# Patient Record
Sex: Male | Born: 1950 | Race: Black or African American | Hispanic: No | Marital: Married | Smoking: Never smoker
Health system: Southern US, Community
[De-identification: ages and names within clinical notes are randomized; demographics above are authoritative.]

## PROBLEM LIST (undated history)

## (undated) DIAGNOSIS — E559 Vitamin D deficiency, unspecified: Secondary | ICD-10-CM

## (undated) DIAGNOSIS — Q774 Achondroplasia: Secondary | ICD-10-CM

## (undated) DIAGNOSIS — I10 Essential (primary) hypertension: Secondary | ICD-10-CM

## (undated) DIAGNOSIS — M1990 Unspecified osteoarthritis, unspecified site: Secondary | ICD-10-CM

## (undated) DIAGNOSIS — E119 Type 2 diabetes mellitus without complications: Secondary | ICD-10-CM

## (undated) DIAGNOSIS — E785 Hyperlipidemia, unspecified: Secondary | ICD-10-CM

## (undated) DIAGNOSIS — H409 Unspecified glaucoma: Secondary | ICD-10-CM

## (undated) DIAGNOSIS — R51 Headache: Secondary | ICD-10-CM

## (undated) DIAGNOSIS — M109 Gout, unspecified: Secondary | ICD-10-CM

## (undated) DIAGNOSIS — R519 Headache, unspecified: Secondary | ICD-10-CM

## (undated) DIAGNOSIS — K4090 Unilateral inguinal hernia, without obstruction or gangrene, not specified as recurrent: Secondary | ICD-10-CM

## (undated) DIAGNOSIS — R001 Bradycardia, unspecified: Secondary | ICD-10-CM

## (undated) HISTORY — DX: Essential (primary) hypertension: I10

## (undated) HISTORY — DX: Achondroplasia: Q77.4

## (undated) HISTORY — DX: Headache, unspecified: R51.9

## (undated) HISTORY — DX: Unspecified osteoarthritis, unspecified site: M19.90

## (undated) HISTORY — DX: Headache: R51

## (undated) HISTORY — PX: CATARACT EXTRACTION W/ INTRAOCULAR LENS IMPLANT: SHX1309

## (undated) HISTORY — DX: Bradycardia, unspecified: R00.1

## (undated) HISTORY — DX: Type 2 diabetes mellitus without complications: E11.9

## (undated) HISTORY — DX: Hyperlipidemia, unspecified: E78.5

## (undated) HISTORY — PX: OTHER SURGICAL HISTORY: SHX169

## (undated) HISTORY — DX: Vitamin D deficiency, unspecified: E55.9

---
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
## 2011-03-25 ENCOUNTER — Ambulatory Visit: Payer: Self-pay | Admitting: General Surgery

## 2011-10-08 ENCOUNTER — Ambulatory Visit (INDEPENDENT_AMBULATORY_CARE_PROVIDER_SITE_OTHER): Payer: 59

## 2011-10-08 DIAGNOSIS — R079 Chest pain, unspecified: Secondary | ICD-10-CM

## 2011-10-08 DIAGNOSIS — L600 Ingrowing nail: Secondary | ICD-10-CM

## 2011-10-08 DIAGNOSIS — J45909 Unspecified asthma, uncomplicated: Secondary | ICD-10-CM

## 2011-10-08 DIAGNOSIS — E669 Obesity, unspecified: Secondary | ICD-10-CM

## 2011-10-08 DIAGNOSIS — M545 Low back pain, unspecified: Secondary | ICD-10-CM

## 2011-11-04 LAB — HM COLONOSCOPY: HM Colonoscopy: NORMAL

## 2011-11-05 ENCOUNTER — Ambulatory Visit (INDEPENDENT_AMBULATORY_CARE_PROVIDER_SITE_OTHER): Payer: 59

## 2011-11-05 DIAGNOSIS — K5900 Constipation, unspecified: Secondary | ICD-10-CM

## 2011-11-05 DIAGNOSIS — R252 Cramp and spasm: Secondary | ICD-10-CM

## 2011-11-05 DIAGNOSIS — M4800 Spinal stenosis, site unspecified: Secondary | ICD-10-CM

## 2011-12-31 ENCOUNTER — Ambulatory Visit (INDEPENDENT_AMBULATORY_CARE_PROVIDER_SITE_OTHER): Payer: 59 | Admitting: Family Medicine

## 2011-12-31 DIAGNOSIS — IMO0002 Reserved for concepts with insufficient information to code with codable children: Secondary | ICD-10-CM

## 2011-12-31 DIAGNOSIS — J019 Acute sinusitis, unspecified: Secondary | ICD-10-CM

## 2011-12-31 DIAGNOSIS — M5116 Intervertebral disc disorders with radiculopathy, lumbar region: Secondary | ICD-10-CM

## 2011-12-31 MED ORDER — PREDNISONE 20 MG PO TABS: 20.0000 mg | ORAL_TABLET | Freq: Every day | ORAL | Status: AC

## 2011-12-31 MED ORDER — DICLOFENAC SODIUM 75 MG PO TBEC: 75.0000 mg | DELAYED_RELEASE_TABLET | Freq: Two times a day (BID) | ORAL | Status: DC

## 2011-12-31 MED ORDER — HYDROCODONE-ACETAMINOPHEN 5-500 MG PO TABS: 1.0000 | ORAL_TABLET | Freq: Four times a day (QID) | ORAL | Status: AC | PRN

## 2011-12-31 MED ORDER — METAXALONE 800 MG PO TABS: ORAL_TABLET | ORAL | Status: DC

## 2011-12-31 MED ORDER — AMOXICILLIN 500 MG PO CAPS: 500.0000 mg | ORAL_CAPSULE | Freq: Two times a day (BID) | ORAL | Status: AC

## 2011-12-31 NOTE — Progress Notes (Signed)
S: Patient is in here with 2 weeks of Right hip and theigh pain.  It started with kicking stuff into a dumster at work.  Then this week he was riding some equipment which bounced hard and flared again.  See Jan note  Also has sinusitis, pnd, no drainage.  O: Frontal tenderness.  Tm's n;/  Throat clear.  Neck supple .  Chest CTA Right SI tenderness.   Decreased flexion of spine.  SLR+++at 10% on right, negative on left.  DTR's ok  A:  Low back to lateral posterior theigh pain, radiating, prob. Disc      Sinusitis  P:  Steroids      Pain meds      Muscle relaxants      Rx sinuses.

## 2011-12-31 NOTE — Patient Instructions (Signed)

## 2012-01-14 ENCOUNTER — Telehealth: Payer: Self-pay

## 2012-01-14 NOTE — Telephone Encounter (Signed)
Can we do anything differently to help?  Pls advise

## 2012-01-14 NOTE — Telephone Encounter (Signed)
.  [facility] Patient called to state that he was not experiencing any relief from the hip pain. Patient was seen on 12/31/11 about the hip pain and would like Dr. Alwyn Ren to call something else in to his pharmacy to help relieve the pain. Patient also stated that he had an infection in his throat and would like a medication called in for that as well.  Please call the patient at [phone] to discuss his treatment options.  Patient stated that Dr. Alwyn Ren told him to call if he was not experiencing relief from symptoms.

## 2012-01-15 ENCOUNTER — Telehealth: Payer: Self-pay

## 2012-01-15 NOTE — Telephone Encounter (Signed)
Pt is needing to talk with someone about that he has the same symptons and hip pain and would like something else called in to [address]

## 2012-01-15 NOTE — Telephone Encounter (Signed)
Previous message from yesterday routed to Dr. Alwyn Ren.  Please advise.

## 2012-01-16 ENCOUNTER — Ambulatory Visit (INDEPENDENT_AMBULATORY_CARE_PROVIDER_SITE_OTHER): Payer: 59 | Admitting: Physician Assistant

## 2012-01-16 VITALS — BP 144/82 | HR 49 | Temp 97.7°F | Resp 12 | Ht 68.0 in | Wt 239.0 lb

## 2012-01-16 DIAGNOSIS — M549 Dorsalgia, unspecified: Secondary | ICD-10-CM

## 2012-01-16 DIAGNOSIS — M62838 Other muscle spasm: Secondary | ICD-10-CM

## 2012-01-16 DIAGNOSIS — S39012A Strain of muscle, fascia and tendon of lower back, initial encounter: Secondary | ICD-10-CM

## 2012-01-16 DIAGNOSIS — S335XXA Sprain of ligaments of lumbar spine, initial encounter: Secondary | ICD-10-CM

## 2012-01-16 MED ORDER — HYDROCODONE-ACETAMINOPHEN 5-325 MG PO TABS: 1.0000 | ORAL_TABLET | Freq: Four times a day (QID) | ORAL | Status: AC | PRN

## 2012-01-16 MED ORDER — MELOXICAM 15 MG PO TABS: 15.0000 mg | ORAL_TABLET | Freq: Every day | ORAL | Status: DC

## 2012-01-16 MED ORDER — CYCLOBENZAPRINE HCL 5 MG PO TABS: 5.0000 mg | ORAL_TABLET | Freq: Three times a day (TID) | ORAL | Status: AC | PRN

## 2012-01-16 NOTE — Telephone Encounter (Signed)
I saw pt this evening and took care of his concerns.

## 2012-01-16 NOTE — Telephone Encounter (Signed)
Pt came into 102 to ask whether he needed to be seen, and was given message from Dr Alwyn Ren. Pt decided to stay and be seen.

## 2012-01-16 NOTE — Progress Notes (Signed)
  Subjective:    Patient ID: Troy Obrien, male    DOB: January 08, 1951, 61 y.o.   MRN: [id number]  HPI Pt presents for lumbar pain - he has had for about 2 -3 wks started about the time of his hip pain - has noticed increased pain at work - the pain is in his R back, the muscle beside her vertebrae - some pain radiation to the L side.  No pain radiation into his legs.  Skelaxin and voltaren are not helping his symptoms - the vicodin helped the most - he would like more so he can continue to work over the next month.  Hip pain has improved.   Review of Systems  Musculoskeletal: Positive for back pain. Negative for myalgias and gait problem.  Neurological:       No paresthesia.  No weakness.       Objective:   Physical Exam  Constitutional: He is oriented to person, place, and time. He appears well-developed and well-nourished.  HENT:  Head: Normocephalic and atraumatic.  Right Ear: External ear normal.  Left Ear: External ear normal.  Pulmonary/Chest: Effort normal.  Musculoskeletal:       Lumbar back: He exhibits tenderness (paraspinal muscle spasm and tenderness on the R side), pain (paraspinal muscles - also with bending to the R, increased pain with extension vs flexion of ROM) and spasm (R side). He exhibits normal range of motion and no bony tenderness.       Back:  Neurological: He is alert and oriented to person, place, and time. He has normal reflexes.  Skin: Skin is warm and dry.  Psychiatric: He has a normal mood and affect. His behavior is normal. Judgment and thought content normal.          Assessment & Plan:   1. Back pain  cyclobenzaprine (FLEXERIL) 5 MG tablet, meloxicam (MOBIC) 15 MG tablet, HYDROcodone-acetaminophen (NORCO) 5-325 MG per tablet  2. Lumbar strain  cyclobenzaprine (FLEXERIL) 5 MG tablet, meloxicam (MOBIC) 15 MG tablet, HYDROcodone-acetaminophen (NORCO) 5-325 MG per tablet  3. 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  He should use heat on his muscles.  If he needs more pain meds because his problem is not improving he will need a referral to ortho.  We will change his NSAID and muscle relaxer because they seem to not be helping.

## 2012-01-16 NOTE — Telephone Encounter (Signed)
This should have improved with the medications we gave him. I think he needs to come back in for a recheck. Otherwise we can refer him to orthopedics if he prefers not to come back in here.

## 2012-02-02 ENCOUNTER — Encounter: Payer: Self-pay | Admitting: Family Medicine

## 2012-02-02 ENCOUNTER — Ambulatory Visit (INDEPENDENT_AMBULATORY_CARE_PROVIDER_SITE_OTHER): Payer: Self-pay | Admitting: Family Medicine

## 2012-02-02 VITALS — BP 120/70 | HR 72 | Temp 97.7°F | Resp 12 | Ht 67.5 in | Wt 232.0 lb

## 2012-02-02 DIAGNOSIS — I10 Essential (primary) hypertension: Secondary | ICD-10-CM

## 2012-02-02 DIAGNOSIS — IMO0001 Reserved for inherently not codable concepts without codable children: Secondary | ICD-10-CM

## 2012-02-02 DIAGNOSIS — R5383 Other fatigue: Secondary | ICD-10-CM

## 2012-02-02 DIAGNOSIS — H409 Unspecified glaucoma: Secondary | ICD-10-CM | POA: Insufficient documentation

## 2012-02-02 DIAGNOSIS — R5381 Other malaise: Secondary | ICD-10-CM

## 2012-02-02 DIAGNOSIS — M791 Myalgia, unspecified site: Secondary | ICD-10-CM

## 2012-02-02 HISTORY — DX: Essential (primary) hypertension: I10

## 2012-02-02 MED ORDER — LOSARTAN POTASSIUM-HCTZ 100-25 MG PO TABS: 1.0000 | ORAL_TABLET | Freq: Every day | ORAL | Status: DC

## 2012-02-02 NOTE — Progress Notes (Signed)
  Subjective:    Patient ID: Troy Obrien, male    DOB: 31-Aug-1951, 61 y.o.   MRN: [id number]  HPI  New patient to establish care. Past medical history reviewed. History of glaucoma followed by ophthalmologist. Hypertension treated with losartan HCTZ. Denies prior surgery. Compliant with blood pressure medication. No recent headaches or dizziness.  Patient has issue of myalgias and muscle stiffness mostly involving the trunk region. He has symmetric involvement with lower back, shoulders, arms. He has tried multiple muscle relaxers and nonsteroidals without improvement. He denies any fever or chills. Has pervasive fatigue. Appetite and weight are stable. Denies any arthralgias. He is not aware of any prior screening labs.  Past Medical History  Diagnosis Date  . Headache    History reviewed. No pertinent past surgical history.  reports that he has never smoked. He does not have any smokeless tobacco history on file. His alcohol and drug histories not on file. family history is not on file. No Known Allergies    Review of Systems  Constitutional: Positive for fatigue. Negative for fever, chills, appetite change and unexpected weight change.  Eyes: Negative for visual disturbance.  Respiratory: Negative for cough and shortness of breath.   Cardiovascular: Negative for chest pain.  Gastrointestinal: Negative for abdominal pain.  Musculoskeletal: Positive for back pain. Negative for myalgias and arthralgias.  Skin: Negative for rash.  Neurological: Negative for headaches.       Objective:   Physical Exam  Constitutional: He appears well-developed and well-nourished. No distress.  Neck: Neck supple.  Cardiovascular: Normal rate and regular rhythm.   Pulmonary/Chest: Effort normal and breath sounds normal. No respiratory distress. He has no wheezes. He has no rales.  Musculoskeletal: He exhibits no edema.  Lymphadenopathy:    He has no cervical adenopathy.            Assessment & Plan:  Polymyalgias. ?PMR.  No arthralgias.  Patient does not take any concerning medication such as statin. Check labs including sed rate, CBC, basic metabolic panel, and TSH. If sed rate elevated consider trial of low-dose prednisone.  Hypertension-stable. Reported glaucoma followed by opthalmology.

## 2012-02-02 NOTE — Patient Instructions (Signed)
We will call you regarding lab work. Consider scheduling complete physical soon.

## 2012-02-03 ENCOUNTER — Telehealth: Payer: Self-pay | Admitting: Family Medicine

## 2012-02-03 LAB — CBC WITH DIFFERENTIAL/PLATELET
Eosinophils Relative: 5 % (ref 0.0–5.0)
HCT: 40 % (ref 39.0–52.0)
Hemoglobin: 13.3 g/dL (ref 13.0–17.0)
Lymphs Abs: 1.8 10*3/uL (ref 0.7–4.0)
Monocytes Relative: 11.2 % (ref 3.0–12.0)
Platelets: 276 10*3/uL (ref 150.0–400.0)
WBC: 6.2 10*3/uL (ref 4.5–10.5)

## 2012-02-03 LAB — TSH: TSH: 0.38 u[IU]/mL (ref 0.35–5.50)

## 2012-02-03 LAB — BASIC METABOLIC PANEL
BUN: 29 mg/dL — ABNORMAL HIGH (ref 6–23)
Chloride: 102 mEq/L (ref 96–112)
Potassium: 4.5 mEq/L (ref 3.5–5.1)
Sodium: 138 mEq/L (ref 135–145)

## 2012-02-03 NOTE — Telephone Encounter (Signed)
Pt called and was told by Dr Caryl Never to call back in today, to see if there was anything pt needs to do re: labs or if a med was going to be called in per discussion during ov. Pt is aware that pcp is out of office.

## 2012-02-06 MED ORDER — PREDNISONE 10 MG PO TABS: 10.0000 mg | ORAL_TABLET | Freq: Every day | ORAL | Status: DC

## 2012-02-06 NOTE — Telephone Encounter (Signed)
[facility] call [facility]

## 2012-02-06 NOTE — Telephone Encounter (Signed)
Labs reviewed and significant for elevated creatinine ?chronic and mildly elevated sed rate of uncertain significance Recommend:  Trial of low dose prednisone 10 mg 2 daily for 3 days then decrease to one daily-until follow up and office follow up within 2 weeks to reassess.

## 2012-02-06 NOTE — Telephone Encounter (Signed)
Prednisone sent to pt pharmacy, pt given instructions.  He said he had taken prednisone once before and "it did not do anything".  Encouraged pt to take the med as instructed and follow-up in 2 weeks.

## 2012-03-02 ENCOUNTER — Ambulatory Visit (INDEPENDENT_AMBULATORY_CARE_PROVIDER_SITE_OTHER): Payer: 59 | Admitting: Family Medicine

## 2012-03-02 VITALS — BP 148/84 | HR 56 | Temp 97.5°F | Resp 16 | Ht 68.0 in

## 2012-03-02 DIAGNOSIS — S39012A Strain of muscle, fascia and tendon of lower back, initial encounter: Secondary | ICD-10-CM

## 2012-03-02 DIAGNOSIS — M62838 Other muscle spasm: Secondary | ICD-10-CM

## 2012-03-02 DIAGNOSIS — K4090 Unilateral inguinal hernia, without obstruction or gangrene, not specified as recurrent: Secondary | ICD-10-CM

## 2012-03-02 DIAGNOSIS — K4190 Unilateral femoral hernia, without obstruction or gangrene, not specified as recurrent: Secondary | ICD-10-CM

## 2012-03-02 MED ORDER — MELOXICAM 15 MG PO TABS: 15.0000 mg | ORAL_TABLET | Freq: Every day | ORAL | Status: DC

## 2012-03-02 NOTE — Patient Instructions (Signed)
Surgical referral to evaluate (R) direct inguinal hernia  Work restrictions until surgical evaluation  May take Mobic 15 mg as needed for discomfort  We will contact you with appointment for a complete physical examination at our appointment center

## 2012-03-02 NOTE — Progress Notes (Signed)
  Subjective:    Patient ID: Troy Obrien, male    DOB: 10/16/1951, 61 y.o.   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  Fire estinguisher's weigh between 25-60 pounds.  States had EGD/colonoscopy in the [place]; hernia noted (R) groin however told surgery was not neccessary  if patient was not symptomatic. Patient is requesting work restrictions.  Patient states no change in size of hernia, completely reduces and currently may be more tender.  Review of Systems     Objective:   Physical Exam  Constitutional: He appears well-developed.  Neck: Neck supple.  Cardiovascular: Normal rate, regular rhythm and normal heart sounds.   Pulmonary/Chest: Effort normal and breath sounds normal.  Abdominal: Soft. Bowel sounds are normal.  Genitourinary:       (R) direct inguinal hernia.  Non tender and fully reducible  Musculoskeletal:       Lumbar back: He exhibits tenderness (para lumbar muscles). He exhibits normal range of motion and no bony tenderness.  Neurological: He is alert. He has normal strength. No sensory deficit. He exhibits normal muscle tone.  Reflex Scores:      Patellar reflexes are 2+ on the right side and 2+ on the left side.      Neg SLR (B)          Assessment & Plan:   1. Lumbar strain  meloxicam (MOBIC) 15 MG tablet  2. Direct inguinal hernia     Patient instuctions:  Surgical referral to evaluate (R) direct inguinal hernia  Work restrictions until surgical evaluation  May take Mobic 15 mg as needed for discomfort  We will contact you with appointment for a complete physical examination at our appointment center

## 2012-03-12 ENCOUNTER — Encounter (INDEPENDENT_AMBULATORY_CARE_PROVIDER_SITE_OTHER): Payer: Self-pay | Admitting: General Surgery

## 2012-03-30 ENCOUNTER — Ambulatory Visit (INDEPENDENT_AMBULATORY_CARE_PROVIDER_SITE_OTHER): Payer: 59 | Admitting: General Surgery

## 2012-03-30 ENCOUNTER — Encounter (INDEPENDENT_AMBULATORY_CARE_PROVIDER_SITE_OTHER): Payer: Self-pay | Admitting: General Surgery

## 2012-03-30 VITALS — BP 120/84 | HR 60 | Temp 97.7°F | Resp 16 | Ht 67.5 in | Wt 238.4 lb

## 2012-03-30 DIAGNOSIS — R1011 Right upper quadrant pain: Secondary | ICD-10-CM | POA: Insufficient documentation

## 2012-03-30 DIAGNOSIS — M549 Dorsalgia, unspecified: Secondary | ICD-10-CM

## 2012-03-30 DIAGNOSIS — K4090 Unilateral inguinal hernia, without obstruction or gangrene, not specified as recurrent: Secondary | ICD-10-CM

## 2012-03-30 HISTORY — DX: Unilateral inguinal hernia, without obstruction or gangrene, not specified as recurrent: K40.90

## 2012-03-30 HISTORY — DX: Dorsalgia, unspecified: M54.9

## 2012-03-30 HISTORY — DX: Right upper quadrant pain: R10.11

## 2012-03-30 NOTE — Progress Notes (Signed)
Subjective:     Patient ID: Troy Obrien, male   DOB: [birth year], 61 y.o.   MRN: [id number]  HPI We are asked to see the patient in consultation by Dr. Caryl Never to evaluate him for a right inguinal hernia. The patient is a 61 year old white male who states that he has had a hernia for at least 2 years. He denies any nausea or vomiting. 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 At times he will notice a gurgling in the right groin. Occasionally he does get some discomfort associated with it. He has been able to push it back in.The patient also complains of significant right upper quadrant and epigastric pain which occurs all the time as well as low back pain  Review of Systems  Constitutional: Negative.   HENT: Negative.   Eyes: Negative.   Respiratory: Negative.   Cardiovascular: Negative.   Gastrointestinal: Negative.   Genitourinary: Negative.   Musculoskeletal: Negative.   Skin: Negative.   Neurological: Negative.   Hematological: Negative.   Psychiatric/Behavioral: Negative.        Objective:   Physical Exam  Constitutional: He is oriented to person, place, and time. He appears well-developed and well-nourished.  HENT:  Head: Normocephalic and atraumatic.  Eyes: Conjunctivae and EOM are normal. Pupils are equal, round, and reactive to light.  Neck: Normal range of motion. Neck supple.  Cardiovascular: Normal rate, regular rhythm and normal heart sounds.   Pulmonary/Chest: Effort normal and breath sounds normal.  Abdominal: Soft. Bowel sounds are normal.       The patient does have tenderness to palpation in the epigastric and right upper quadrant area. There is no palpable mass.  Musculoskeletal:       The patient complains of significant back pain but states it does not radiate down either leg  Neurological: He is alert and oriented to person, place, and time.  Skin: Skin is warm and dry.  Psychiatric: He has a normal mood and  affect. His behavior is normal.       Assessment:     At this point the patient appears to have 3 problems. #1 is a symptomatic right inguinal hernia. #2 is epigastric and right upper quadrant pain. #3 is significant low back pain.    Plan:     As far as his right upper quadrant pain is concerned I think he'll have a abdominal ultrasound to evaluate this. As far as his back pain is concerned I think he'll have an MRI of his low back because of his pain and heavy lifting that he doesn't work. As far as his right inguinal hernia is concerned, because of the risk of incarceration and strangulation if he could benefit from having this fixed. I've discussed with him in detail the risks and benefits the operation a fixed hernia as well as some of the technical aspects including the use of mesh and the risk of injury to the vas deferens and testicular artery and he understands. He would like to think about it and then call back when he is ready to schedule.

## 2012-03-30 NOTE — Patient Instructions (Signed)
Plan for right inguinal hernia repair with mesh Will call with results of mri back and ultrasound of abdomen

## 2012-04-16 ENCOUNTER — Other Ambulatory Visit (INDEPENDENT_AMBULATORY_CARE_PROVIDER_SITE_OTHER): Payer: Self-pay | Admitting: General Surgery

## 2012-04-16 DIAGNOSIS — M545 Low back pain: Secondary | ICD-10-CM

## 2012-04-23 ENCOUNTER — Ambulatory Visit
Admission: RE | Admit: 2012-04-23 | Discharge: 2012-04-23 | Disposition: A | Discharge status: Home / Self Care | Payer: 59 | Source: Ambulatory Visit | Attending: General Surgery | Admitting: General Surgery

## 2012-04-23 ENCOUNTER — Other Ambulatory Visit: Payer: Self-pay

## 2012-04-23 DIAGNOSIS — R1011 Right upper quadrant pain: Secondary | ICD-10-CM

## 2012-05-14 ENCOUNTER — Telehealth (INDEPENDENT_AMBULATORY_CARE_PROVIDER_SITE_OTHER): Payer: Self-pay | Admitting: General Surgery

## 2012-05-14 NOTE — Telephone Encounter (Signed)
LMOM for patient to call me back at his earliest conveniences.

## 2012-05-15 ENCOUNTER — Telehealth (INDEPENDENT_AMBULATORY_CARE_PROVIDER_SITE_OTHER): Payer: Self-pay | Admitting: General Surgery

## 2012-05-15 NOTE — Telephone Encounter (Signed)
Spoke with patient and informed him that his [place] did not show anything that would be causing him to have the RUQ pain.  I also explained to him that his insurance denied the MR of the spine so he will need to make an appt with [facility].  I explained that Thayer Ohm, the new pt coordinator for them, would be contacting him soon to get that appt set up for him.  He said this was fine and that he will expect her call.

## 2012-06-11 ENCOUNTER — Ambulatory Visit (INDEPENDENT_AMBULATORY_CARE_PROVIDER_SITE_OTHER): Payer: 59 | Admitting: Family Medicine

## 2012-06-11 ENCOUNTER — Ambulatory Visit: Payer: 59

## 2012-06-11 VITALS — BP 116/75 | HR 63 | Temp 97.6°F | Resp 16 | Ht 68.0 in | Wt 238.0 lb

## 2012-06-11 DIAGNOSIS — R079 Chest pain, unspecified: Secondary | ICD-10-CM

## 2012-06-11 DIAGNOSIS — R109 Unspecified abdominal pain: Secondary | ICD-10-CM

## 2012-06-11 DIAGNOSIS — E785 Hyperlipidemia, unspecified: Secondary | ICD-10-CM

## 2012-06-11 DIAGNOSIS — Z131 Encounter for screening for diabetes mellitus: Secondary | ICD-10-CM

## 2012-06-11 LAB — POCT CBC
Granulocyte percent: 49.2 % (ref 37–80)
HCT, POC: 44.1 % (ref 43.5–53.7)
Hemoglobin: 13.7 g/dL — AB (ref 14.1–18.1)
Lymph, poc: 2.7 (ref 0.6–3.4)
MCH, POC: 26.4 pg — AB (ref 27–31.2)
MCHC: 31.1 g/dL — AB (ref 31.8–35.4)
MCV: 85.1 fL (ref 80–97)
MID (cbc): 0.8 (ref 0–0.9)
MPV: 9.2 fL (ref 0–99.8)
POC Granulocyte: 3.3 (ref 2–6.9)
POC LYMPH PERCENT: 39.7 %L (ref 10–50)
POC MID %: 11.1 %M (ref 0–12)
Platelet Count, POC: 308 10*3/uL (ref 142–424)
RBC: 5.18 M/uL (ref 4.69–6.13)
RDW, POC: 15.7 %
WBC: 6.8 10*3/uL (ref 4.6–10.2)

## 2012-06-11 LAB — TROPONIN I: Troponin I: <0.01 ng/mL (ref <0.06)

## 2012-06-11 LAB — POCT GLYCOSYLATED HEMOGLOBIN (HGB A1C): Hemoglobin A1C: 6.4

## 2012-06-11 MED ORDER — TRAMADOL HCL 50 MG PO TABS: 50.0000 mg | ORAL_TABLET | Freq: Three times a day (TID) | ORAL | Status: AC | PRN

## 2012-06-11 NOTE — Progress Notes (Signed)
[facility]:  Office Visit  Chief Complaint:  Chief Complaint  Patient presents with  . Chest Pain    pressure with heaviness since Thursday    HPI: Troy Obrien is a 61 y.o. male who complains of: 1. Would like cholesterol checked. Was rx lovastatin but did not take it. Was given statin meds ( had nausea). Stopped taking meds. This was 2 years ago.  Ate something at 2 pm.   2.  Heavy pressure on chest. Anterior midepigastric, left chest pain. Lasted 5 minutes. CP at rest. Resolved on its own.  Occurred 5 days ago. Has not had one since.  Thursday. + heartburn history. Denies nausea, diaphoresis, palpitations, weakness, numbness, tingling, abd pain, shoulder pain when this occurred. Denies smoking or heavy alcohol consumption. Patient drinks 2 beers daily.   Past Medical History  Diagnosis Date  . Headache   . Hypertension   . Hyperlipidemia    Past Surgical History  Procedure Date  . Circumcision 1972  . Tonsillectomy 1963   History   Social History  . Marital Status: Legally Separated    Spouse Name: N/A    Number of Children: N/A  . Years of Education: N/A   Social History Main Topics  . Smoking status: Never Smoker   . Smokeless tobacco: None  . Alcohol Use: None  . Drug Use: None  . Sexually Active: None   Other Topics Concern  . None   Social History Narrative  . None   No family history on file. Allergies  Allergen Reactions  . Laxative (Bisacodyl) Hives   Prior to Admission medications   Medication Sig Start Date End Date Taking? Authorizing Provider  meloxicam (MOBIC) 15 MG tablet Take 1 tablet (15 mg total) by mouth daily. 03/02/12 03/02/13  Dois Davenport, MD  metaxalone (SKELAXIN) 800 MG tablet Take 800 mg by mouth 3 (three) times daily as needed.  12/31/11   Historical Provider, MD  predniSONE (DELTASONE) 10 MG tablet Take 1 tablet (10 mg total) by mouth daily. Take 2 tabs daily for 3 days, then decease to 1 tab daily 02-10-12    Kristian Covey, MD     ROS: The patient denies fevers, chills, night sweats, unintentional weight loss, palpitations, wheezing, dyspnea on exertion, nausea, vomiting, abdominal pain, dysuria, hematuria, melena, numbness, weakness, or tingling.   All other systems have been reviewed and were otherwise negative with the exception of those mentioned in the HPI and as above.    PHYSICAL EXAM: Filed Vitals:   06/11/12 1907  BP: 116/75  Pulse: 63  Temp: 97.6 F (36.4 C)  Resp: 16   Filed Vitals:   06/11/12 1907  Height: 5\' 8"  (1.727 m)  Weight: 238 lb (107.956 kg)   Body mass index is 36.19 kg/(m^2).  General: Alert, no acute distress HEENT:  Normocephalic, atraumatic, oropharynx patent. EOMI, PERRLA, fundoscopic exam Cardiovascular:  Regular rate and rhythm, no rubs murmurs or gallops.  No Carotid bruits, radial pulse intact. No pedal edema.  Respiratory: Clear to auscultation bilaterally.  No wheezes, rales, or rhonchi.  No cyanosis, no use of accessory musculature GI: No organomegaly, abdomen is soft and non-tender, positive bowel sounds.  No masses. Skin: No rashes. Neurologic: Facial musculature symmetric. Psychiatric: Patient is appropriate throughout our interaction. Lymphatic: No cervical lymphadenopathy Musculoskeletal: Gait intact. Back- full ROM, tender on right side of paraspinal msk, no atrophy/hypertrophy, 5/5 strength, 2/2 DTR   LABS: Results for orders placed in visit  on 06/11/12  POCT CBC      Component Value Range   WBC 6.8  4.6 - 10.2 K/uL   Lymph, poc 2.7  0.6 - 3.4   POC LYMPH PERCENT 39.7  10 - 50 %L   MID (cbc) 0.8  0 - 0.9   POC MID % 11.1  0 - 12 %M   POC Granulocyte 3.3  2 - 6.9   Granulocyte percent 49.2  37 - 80 %G   RBC 5.18  4.69 - 6.13 M/uL   Hemoglobin 13.7 (*) 14.1 - 18.1 g/dL   HCT, POC 16.1  09.6 - 53.7 %   MCV 85.1  80 - 97 fL   MCH, POC 26.4 (*) 27 - 31.2 pg   MCHC 31.1 (*) 31.8 - 35.4 g/dL   RDW, POC 04.5     Platelet Count, POC  308  142 - 424 K/uL   MPV 9.2  0 - 99.8 fL  POCT GLYCOSYLATED HEMOGLOBIN (HGB A1C)      Component Value Range   Hemoglobin A1C 6.4       EKG/XRAY:\   Primary read interpreted by Dr. Conley Rolls at [facility].   ASSESSMENT/PLAN: Encounter Diagnoses  Name Primary?  . Chest pain Yes  . Screening for diabetes mellitus   . Side pain    1. Acute Chest Pain-patient has risk factors ie HTN, XOL ( noncompliant secondary to SEs) and recently dx pre-diabetes in office. His CP was at rest and was localized to the midepigastric,left anterio region. It resolved on it's own after 5 minutes. He has had reflux  sxs before. He has been active and has had no CP since that one episode on Thursday. CXR and EKG normal. I will get basic labs, recheck his cholesterol level, and  get a Troponin-I .  Recommend that he be seen by cardiology if sxs return.  Patient may benefit from a low dose metformin for pre-diabetes but at this time he would like to defer since he wants to try losing weight first.  GO TO ER PRN FOR CP/SOB or worsening SXS  2. Msk sprain/strain/ pain due to pulling up amplifier with poor posture-Rx NSAID and  Tramadol prn  3. Mild anemia-Patient would like to defer fecal occult testing today. Patient denies any hematuria, melena or BRBPR, dizziness. He is UTD on colonoscopy, last one done on 2012 was normal per patient . He has been advised that I recommend a hemoccult test today but since he is planning to get a annual exam with Dr. Katrinka Blazing in the next 2 weeks then would like to get it done then.     Troy Capri PHUONG, DO 06/11/2012 8:44 PM

## 2012-06-12 LAB — LIPID PANEL
Cholesterol: 241 mg/dL — ABNORMAL HIGH (ref 0–200)
HDL: 42 mg/dL (ref >39)
LDL Cholesterol: 138 mg/dL — ABNORMAL HIGH (ref 0–99)
Total CHOL/HDL Ratio: 5.7 Ratio
Triglycerides: 306 mg/dL — ABNORMAL HIGH (ref <150)
VLDL: 61 mg/dL — ABNORMAL HIGH (ref 0–40)

## 2012-06-12 LAB — COMPREHENSIVE METABOLIC PANEL
AST: 18 U/L (ref 0–37)
BUN: 19 mg/dL (ref 6–23)
Calcium: 9.7 mg/dL (ref 8.4–10.5)
Chloride: 103 mEq/L (ref 96–112)
Creat: 1.53 mg/dL — ABNORMAL HIGH (ref 0.50–1.35)
Total Bilirubin: 0.3 mg/dL (ref 0.3–1.2)

## 2012-06-12 LAB — COMPREHENSIVE METABOLIC PANEL WITH GFR
ALT: 17 U/L (ref 0–53)
Albumin: 4.4 g/dL (ref 3.5–5.2)
Alkaline Phosphatase: 41 U/L (ref 39–117)
CO2: 25 meq/L (ref 19–32)
Glucose, Bld: 91 mg/dL (ref 70–99)
Potassium: 4.2 meq/L (ref 3.5–5.3)
Sodium: 138 meq/L (ref 135–145)
Total Protein: 7.8 g/dL (ref 6.0–8.3)

## 2012-06-13 ENCOUNTER — Other Ambulatory Visit: Payer: Self-pay | Admitting: Family Medicine

## 2012-06-13 ENCOUNTER — Telehealth: Payer: Self-pay

## 2012-06-13 ENCOUNTER — Telehealth: Payer: Self-pay | Admitting: Family Medicine

## 2012-06-13 DIAGNOSIS — R109 Unspecified abdominal pain: Secondary | ICD-10-CM

## 2012-06-13 MED ORDER — EZETIMIBE 10 MG PO TABS: 10.0000 mg | ORAL_TABLET | Freq: Every day | ORAL | Status: DC

## 2012-06-13 MED ORDER — HYDROCODONE-ACETAMINOPHEN 5-325 MG PO TABS: 1.0000 | ORAL_TABLET | Freq: Three times a day (TID) | ORAL | Status: AC | PRN

## 2012-06-13 MED ORDER — HYDROCODONE-ACETAMINOPHEN 5-325 MG PO TABS: 1.0000 | ORAL_TABLET | Freq: Three times a day (TID) | ORAL | Status: DC | PRN

## 2012-06-13 NOTE — Telephone Encounter (Signed)
Spoke to patient about labs. Will rx Zetia. His msk pain has not improved. I told him I will leave a rx for 20 pills of hydrocodone up front for him to pick up. He has tried tramadil, dioclofenac, mobic anf msk relaxers without relief. Aadvise to make f/u appt for annual exam.

## 2012-07-03 ENCOUNTER — Encounter: Payer: Self-pay | Admitting: Family Medicine

## 2012-07-08 ENCOUNTER — Other Ambulatory Visit: Payer: Self-pay | Admitting: *Deleted

## 2012-07-08 ENCOUNTER — Ambulatory Visit (INDEPENDENT_AMBULATORY_CARE_PROVIDER_SITE_OTHER): Payer: 59 | Admitting: Family Medicine

## 2012-07-08 VITALS — BP 135/73 | HR 71 | Temp 98.1°F | Resp 16 | Ht 67.0 in | Wt 231.0 lb

## 2012-07-08 DIAGNOSIS — K469 Unspecified abdominal hernia without obstruction or gangrene: Secondary | ICD-10-CM

## 2012-07-08 DIAGNOSIS — M19049 Primary osteoarthritis, unspecified hand: Secondary | ICD-10-CM

## 2012-07-08 DIAGNOSIS — J329 Chronic sinusitis, unspecified: Secondary | ICD-10-CM

## 2012-07-08 DIAGNOSIS — R5383 Other fatigue: Secondary | ICD-10-CM

## 2012-07-08 DIAGNOSIS — G4730 Sleep apnea, unspecified: Secondary | ICD-10-CM

## 2012-07-08 DIAGNOSIS — R5381 Other malaise: Secondary | ICD-10-CM

## 2012-07-08 HISTORY — DX: Unspecified abdominal hernia without obstruction or gangrene: K46.9

## 2012-07-08 HISTORY — DX: Other fatigue: R53.83

## 2012-07-08 HISTORY — DX: Primary osteoarthritis, unspecified hand: M19.049

## 2012-07-08 HISTORY — DX: Sleep apnea, unspecified: G47.30

## 2012-07-08 LAB — POCT CBC
MCH, POC: 27.2 pg (ref 27–31.2)
MCV: 84.1 fL (ref 80–97)
MID (cbc): 0.7 (ref 0–0.9)
MPV: 9 fL (ref 0–99.8)
POC LYMPH PERCENT: 33.8 %L (ref 10–50)
POC MID %: 10.8 %M (ref 0–12)
Platelet Count, POC: 294 10*3/uL (ref 142–424)
RBC: 4.89 M/uL (ref 4.69–6.13)
RDW, POC: 15.1 %
WBC: 6.4 10*3/uL (ref 4.6–10.2)

## 2012-07-08 MED ORDER — FLUTICASONE PROPIONATE 50 MCG/ACT NA SUSP: 2.0000 | Freq: Every day | NASAL | Status: DC

## 2012-07-08 MED ORDER — CEFDINIR 300 MG PO CAPS: ORAL_CAPSULE | ORAL | Status: DC

## 2012-07-08 MED ORDER — DICLOFENAC SODIUM 75 MG PO TBEC: DELAYED_RELEASE_TABLET | ORAL | Status: DC

## 2012-07-08 NOTE — Progress Notes (Signed)
Subjective: he is here for a number of things. He has problems with his sinuses been very congested he says is worse she's ever had. He gets some fevers and chills. He has a lot of mucus urine some cough.  He is fatigued all the time. He's been told he is to go back in for a full sleep study. Never did so.Snores and has  apneac spells.  He has a ride or hernia which has gradually gotten bigger. I checked once about a year ago. I did not reexamine today. He is seeing a Careers adviser and needs to get it taken care of.  He has bad arthritis in his hands. Stretches left hand hurts all time. 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  Objective: TMs normal. Throat clear. Neck supple without nodes. Chest clear. Heart regular without murmurs. Abdomen soft nontender. Left hand is very tender on the index finger are M. CP joint.  Assessment: Obesity Arthritis of hands Sinusitis Sleep apnea syndrome Fatigue Inguinal hernia   Plan: Told him he needs to lose weight. Diclofenac her hands Omnicef for sinusitis Sleep study

## 2012-07-08 NOTE — Patient Instructions (Signed)
Take meds for arthritis and sinusitis  Sleep study will be ordered  Return if not doing betgter

## 2012-07-12 ENCOUNTER — Encounter: Payer: Self-pay | Admitting: Family Medicine

## 2012-08-12 ENCOUNTER — Telehealth: Payer: Self-pay

## 2012-08-12 NOTE — Telephone Encounter (Signed)
He has bad arthritis in his hands. Stretches left hand hurts all time. 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

## 2012-08-12 NOTE — Telephone Encounter (Signed)
Patient states Dr. Alwyn Ren rx'ed him something for arthritis in finger joints and it is not doing the trick. Would like to know if something else can be called in. [facility] [address] [phone]

## 2012-08-13 ENCOUNTER — Telehealth: Payer: Self-pay

## 2012-08-13 MED ORDER — MELOXICAM 15 MG PO TABS: 15.0000 mg | ORAL_TABLET | Freq: Every day | ORAL | Status: DC

## 2012-08-13 NOTE — Telephone Encounter (Signed)
Called patient he will have [facility].

## 2012-08-13 NOTE — Telephone Encounter (Signed)
Patient requested meloxicam (MOBIC) 15 MG tablet [[id number]] to be [facility] [address] but it was sent to [place]. Can we please send this to [address]?

## 2012-08-13 NOTE — Telephone Encounter (Signed)
Please advise patient that I've sent in meloxicam.  If it's not effective, he'll need to RTC for additional evaluation.

## 2012-08-13 NOTE — Telephone Encounter (Signed)
patient notified and voiced understanding. 

## 2012-09-06 ENCOUNTER — Ambulatory Visit (INDEPENDENT_AMBULATORY_CARE_PROVIDER_SITE_OTHER): Payer: 59 | Admitting: General Surgery

## 2012-09-06 ENCOUNTER — Encounter (INDEPENDENT_AMBULATORY_CARE_PROVIDER_SITE_OTHER): Payer: Self-pay | Admitting: General Surgery

## 2012-09-06 VITALS — BP 126/74 | HR 64 | Temp 97.8°F | Resp 20 | Ht 67.5 in | Wt 233.2 lb

## 2012-09-06 DIAGNOSIS — K4090 Unilateral inguinal hernia, without obstruction or gangrene, not specified as recurrent: Secondary | ICD-10-CM

## 2012-09-06 NOTE — Patient Instructions (Signed)
Plan for right inguinal hernia repair with mesh 

## 2012-09-10 NOTE — Progress Notes (Signed)
Subjective:     Patient ID: Troy Obrien, male   DOB: [birth year], 61 y.o.   MRN: [id number]  HPI The patient is a 61 year old black male who we saw back in May with a right inguinal hernia. At that time he got nervous about scheduling surgery and canceled it. Since that time he has continued to have some discomfort and bulging in the right groin. He denies any nausea or vomiting. He does a lot of heavy lifting at work and is now ready to schedule surgery.  Review of Systems  Constitutional: Negative.   HENT: Negative.   Eyes: Negative.   Respiratory: Negative.   Cardiovascular: Negative.   Gastrointestinal: Negative.   Genitourinary: Negative.   Musculoskeletal: Negative.   Skin: Negative.   Neurological: Negative.   Hematological: Negative.   Psychiatric/Behavioral: Negative.        Objective:   Physical Exam  Constitutional: He is oriented to person, place, and time. He appears well-developed and well-nourished.  HENT:  Head: Normocephalic and atraumatic.  Eyes: Conjunctivae normal and EOM are normal. Pupils are equal, round, and reactive to light.  Neck: Normal range of motion. Neck supple.  Cardiovascular: Normal rate, regular rhythm and normal heart sounds.   Pulmonary/Chest: Effort normal and breath sounds normal.  Abdominal: Soft. Bowel sounds are normal.  Genitourinary:       There is a moderate-sized bulge in the right groin that does reduce. There is no palpable bulge or impulse with straining in the left groin  Musculoskeletal: Normal range of motion.  Neurological: He is alert and oriented to person, place, and time.  Skin: Skin is warm and dry.  Psychiatric: He has a normal mood and affect. His behavior is normal.       Assessment:     The patient has a symptomatic moderate size right inguinal hernia. Because of the risk of incarceration and stimulation I think he would benefit from having this fixed. I've discussed with him in detail the risks and benefits of  the operation a fixed hernia as well as some of the technical aspects and he understands and wishes to proceed.    Plan:     Plan for right inguinal hernia repair with mesh.

## 2012-09-14 ENCOUNTER — Telehealth (INDEPENDENT_AMBULATORY_CARE_PROVIDER_SITE_OTHER): Payer: Self-pay

## 2012-09-14 NOTE — Telephone Encounter (Signed)
Pt called stating he has left a request with Dr Billey Chang assistant for pain medication pre op. Per Penni Bombard Dr Carolynne Edouard declined prescribing any pain medication until after surgery. Pt advised of this.

## 2012-09-15 ENCOUNTER — Ambulatory Visit (INDEPENDENT_AMBULATORY_CARE_PROVIDER_SITE_OTHER): Payer: 59 | Admitting: Family Medicine

## 2012-09-15 VITALS — BP 148/72 | HR 66 | Temp 97.7°F | Resp 16 | Ht 67.0 in | Wt 236.0 lb

## 2012-09-15 DIAGNOSIS — M12849 Other specific arthropathies, not elsewhere classified, unspecified hand: Secondary | ICD-10-CM

## 2012-09-15 DIAGNOSIS — R739 Hyperglycemia, unspecified: Secondary | ICD-10-CM

## 2012-09-15 DIAGNOSIS — R7309 Other abnormal glucose: Secondary | ICD-10-CM

## 2012-09-15 DIAGNOSIS — IMO0002 Reserved for concepts with insufficient information to code with codable children: Secondary | ICD-10-CM

## 2012-09-15 DIAGNOSIS — M25549 Pain in joints of unspecified hand: Secondary | ICD-10-CM

## 2012-09-15 DIAGNOSIS — M79673 Pain in unspecified foot: Secondary | ICD-10-CM

## 2012-09-15 DIAGNOSIS — L600 Ingrowing nail: Secondary | ICD-10-CM

## 2012-09-15 MED ORDER — CEPHALEXIN 500 MG PO CAPS: 500.0000 mg | ORAL_CAPSULE | Freq: Three times a day (TID) | ORAL | Status: DC

## 2012-09-15 MED ORDER — HYDROCODONE-ACETAMINOPHEN 5-500 MG PO TABS: 1.0000 | ORAL_TABLET | ORAL | Status: DC | PRN

## 2012-09-15 MED ORDER — MELOXICAM 15 MG PO TABS: 15.0000 mg | ORAL_TABLET | Freq: Every day | ORAL | Status: DC

## 2012-09-15 NOTE — Progress Notes (Signed)
Subjective: Patient is here with couple of complaints. He has pain in the left second MCP joint. The meloxicam gives variable released.  He is he has a ingrown left large toenail which been hurting him quite a lot. He's had it for a long time, probably a couple of years. It's getting worse and he is he is having a hard time wearing his steel toed boots.  Objective: Very tender MCP joint of his index finger left hand. It is slightly swollen.  All labs were reviewed  He has an ingrown left large toenail. It is very tender. There is paronychia little inflammation. His sugars were borderline the past.  Procedure note: Toes with 2% lidocaine. Excision was performed using a routine sterile technique of the medial one third of the nail. It was badly ingrown. There was pus which was cultured. The growth plate was cauterized using the hot needle.  Assessment: Ingrown toenail Arthritis and pain left MCP  Plan: Hydrocodone Continue Mobic Placed him on Keflex 500 3 times a day Return in 6 days for recheck, sooner if worse. Check blood sugar and uric acid on him  Results for orders placed in visit on 09/15/12  POCT GLYCOSYLATED HEMOGLOBIN (HGB A1C)      Component Value Range   Hemoglobin A1C 6.1

## 2012-09-15 NOTE — Patient Instructions (Signed)
Change dressing in 24-48 hr  Return Friday, sooner if worse

## 2012-09-19 LAB — WOUND CULTURE
Gram Stain: NONE SEEN
Gram Stain: NONE SEEN
Gram Stain: NONE SEEN

## 2012-09-21 ENCOUNTER — Ambulatory Visit (INDEPENDENT_AMBULATORY_CARE_PROVIDER_SITE_OTHER): Payer: 59 | Admitting: Family Medicine

## 2012-09-21 VITALS — BP 120/75 | HR 58 | Temp 97.4°F | Resp 16 | Ht 67.0 in | Wt 235.0 lb

## 2012-09-21 DIAGNOSIS — M109 Gout, unspecified: Secondary | ICD-10-CM

## 2012-09-21 DIAGNOSIS — L600 Ingrowing nail: Secondary | ICD-10-CM

## 2012-09-21 MED ORDER — COLCHICINE 0.6 MG PO TABS: 0.6000 mg | ORAL_TABLET | Freq: Every day | ORAL | Status: DC

## 2012-09-21 MED ORDER — ALLOPURINOL 100 MG PO TABS: 100.0000 mg | ORAL_TABLET | Freq: Every day | ORAL | Status: DC

## 2012-09-21 NOTE — Patient Instructions (Signed)
Take colchicine one daily for the gout in that hand. After 10-15 days discontinue the colchicine and began allopurinol 100 mg one daily. Continue taking your meloxicam one daily for 2 weeks.  Return in mid-January for me to recheck your blood.

## 2012-09-21 NOTE — Progress Notes (Signed)
Subjective: Patient here for recheck of his toes. He has improved steadily though it's still tender when he hits the sheets.  Finger still hurts  Objective: Doing well when we removed one third of the toenail. He has a little crusting right at the tip of the toe where it was badly ingrown. I curetted some of the crusting off. He tolerated this well. Assessment: Doing well status post ingrown toenail  Uric acid was 8.4  Assessment: Gout   Plan: Return when necessary use ibuprofen if needed for pain Allopurinol 100 Colchicine qd

## 2012-09-29 ENCOUNTER — Ambulatory Visit (INDEPENDENT_AMBULATORY_CARE_PROVIDER_SITE_OTHER): Payer: 59 | Admitting: Family Medicine

## 2012-09-29 ENCOUNTER — Encounter (HOSPITAL_COMMUNITY): Payer: Self-pay | Admitting: Emergency Medicine

## 2012-09-29 ENCOUNTER — Emergency Department (HOSPITAL_COMMUNITY)
Admission: EM | Admit: 2012-09-29 | Discharge: 2012-09-29 | Disposition: A | Discharge status: Home / Self Care | Payer: 59 | Attending: Emergency Medicine | Admitting: Emergency Medicine

## 2012-09-29 ENCOUNTER — Emergency Department (HOSPITAL_COMMUNITY): Payer: 59

## 2012-09-29 ENCOUNTER — Ambulatory Visit: Payer: 59

## 2012-09-29 ENCOUNTER — Telehealth: Payer: Self-pay

## 2012-09-29 VITALS — BP 144/81 | HR 58 | Temp 98.4°F | Resp 16 | Ht 67.0 in | Wt 227.0 lb

## 2012-09-29 DIAGNOSIS — R1084 Generalized abdominal pain: Secondary | ICD-10-CM | POA: Insufficient documentation

## 2012-09-29 DIAGNOSIS — K4090 Unilateral inguinal hernia, without obstruction or gangrene, not specified as recurrent: Secondary | ICD-10-CM | POA: Insufficient documentation

## 2012-09-29 DIAGNOSIS — E785 Hyperlipidemia, unspecified: Secondary | ICD-10-CM | POA: Insufficient documentation

## 2012-09-29 DIAGNOSIS — I10 Essential (primary) hypertension: Secondary | ICD-10-CM | POA: Insufficient documentation

## 2012-09-29 DIAGNOSIS — R110 Nausea: Secondary | ICD-10-CM

## 2012-09-29 DIAGNOSIS — K5900 Constipation, unspecified: Secondary | ICD-10-CM

## 2012-09-29 DIAGNOSIS — R109 Unspecified abdominal pain: Secondary | ICD-10-CM

## 2012-09-29 DIAGNOSIS — Z8669 Personal history of other diseases of the nervous system and sense organs: Secondary | ICD-10-CM | POA: Insufficient documentation

## 2012-09-29 DIAGNOSIS — Z79899 Other long term (current) drug therapy: Secondary | ICD-10-CM | POA: Insufficient documentation

## 2012-09-29 DIAGNOSIS — R112 Nausea with vomiting, unspecified: Secondary | ICD-10-CM | POA: Insufficient documentation

## 2012-09-29 DIAGNOSIS — Z8739 Personal history of other diseases of the musculoskeletal system and connective tissue: Secondary | ICD-10-CM | POA: Insufficient documentation

## 2012-09-29 HISTORY — DX: Gout, unspecified: M10.9

## 2012-09-29 HISTORY — DX: Unilateral inguinal hernia, without obstruction or gangrene, not specified as recurrent: K40.90

## 2012-09-29 LAB — CBC WITH DIFFERENTIAL/PLATELET
Basophils Relative: 0 % (ref 0–1)
Eosinophils Absolute: 0 10*3/uL (ref 0.0–0.7)
HCT: 42.5 % (ref 39.0–52.0)
Hemoglobin: 14.7 g/dL (ref 13.0–17.0)
Lymphs Abs: 1 10*3/uL (ref 0.7–4.0)
MCH: 27.7 pg (ref 26.0–34.0)
MCHC: 34.6 g/dL (ref 30.0–36.0)
MCV: 80 fL (ref 78.0–100.0)
Monocytes Absolute: 0.3 10*3/uL (ref 0.1–1.0)
Neutro Abs: 3.9 10*3/uL (ref 1.7–7.7)

## 2012-09-29 LAB — COMPREHENSIVE METABOLIC PANEL
Alkaline Phosphatase: 43 U/L (ref 39–117)
BUN: 18 mg/dL (ref 6–23)
Chloride: 95 mEq/L — ABNORMAL LOW (ref 96–112)
Creatinine, Ser: 1.19 mg/dL (ref 0.50–1.35)
GFR calc Af Amer: 76 mL/min — ABNORMAL LOW (ref >90)
Glucose, Bld: 104 mg/dL — ABNORMAL HIGH (ref 70–99)
Potassium: 3.6 mEq/L (ref 3.5–5.1)
Total Bilirubin: 0.7 mg/dL (ref 0.3–1.2)

## 2012-09-29 LAB — LIPASE, BLOOD: Lipase: 22 U/L (ref 11–59)

## 2012-09-29 MED ORDER — SODIUM CHLORIDE 0.9 % IV SOLN: INTRAVENOUS | Status: DC

## 2012-09-29 MED ORDER — ONDANSETRON 8 MG PO TBDP
8.0000 mg | ORAL_TABLET | Freq: Once | ORAL | Status: AC
  Administered 2012-09-29: 8 mg via ORAL
  Filled 2012-09-29: qty 1

## 2012-09-29 MED ORDER — SODIUM CHLORIDE 0.9 % IV BOLUS (SEPSIS)
1000.0000 mL | Freq: Once | INTRAVENOUS | Status: AC
  Administered 2012-09-29: 1000 mL via INTRAVENOUS

## 2012-09-29 MED ORDER — HYDROMORPHONE HCL PF 1 MG/ML IJ SOLN
1.0000 mg | Freq: Once | INTRAMUSCULAR | Status: AC
  Administered 2012-09-29: 1 mg via INTRAVENOUS
  Filled 2012-09-29: qty 1

## 2012-09-29 MED ORDER — PROMETHAZINE HCL 25 MG PO TABS: 25.0000 mg | ORAL_TABLET | Freq: Three times a day (TID) | ORAL | Status: DC | PRN

## 2012-09-29 MED ORDER — ONDANSETRON 4 MG PO TBDP
4.0000 mg | ORAL_TABLET | Freq: Once | ORAL | Status: AC
  Administered 2012-09-29: 4 mg via ORAL

## 2012-09-29 MED ORDER — ONDANSETRON HCL 4 MG/2ML IJ SOLN
4.0000 mg | Freq: Once | INTRAMUSCULAR | Status: AC
Start: 2012-09-29 — End: 2012-09-29
  Administered 2012-09-29: 4 mg via INTRAVENOUS
  Filled 2012-09-29: qty 2

## 2012-09-29 NOTE — ED Notes (Signed)
XBM:WU13<KG> Expected date:<BR> Expected time:<BR> Means of arrival:<BR> Comments:<BR> Hold for triage 2

## 2012-09-29 NOTE — ED Notes (Signed)
Bedside report received from previous RN 

## 2012-09-29 NOTE — Telephone Encounter (Signed)
Wife called back.  She is at the pharmacy and really worried about husband.  He is throwing up and cannot keep anything down and the glycerin supp. are leaking out.  He feels terrible.  While talking to the patient the radiologist called about partial bowel obstruction. I told the patient's wife to take him to the [facility] ED and I called and alerted them of his arrival.

## 2012-09-29 NOTE — ED Notes (Signed)
Pt states that he had severe pain on Wed that went away around the area where the pt has a hernia. Pt been having pain since yesterday and hasnt had BM in three days. Pt was seen at urgent care today and sent here for possible bowel obstruction. Pt been vomiting since last night.

## 2012-09-29 NOTE — Telephone Encounter (Signed)
PT'S WIFE BARBARA Lieder STATES THAT THE PT IS UNABLE TO KEEP THE MERALAX HE WAS TOLD TO DRINK DOWN AND THE SUPPOSITORY IS NOT STAYING IN, PTS WIFE WOULD LIKE TO KNOW WHAT THEY SHOULD DO.IF SOMETHING IS CALLED IN THEY WOULD LIKE FOR IT TO BE SENT TO THE [facility] ON [address] BEST# (WIFE) [phone]  ALSO SHE STATES THAT THE PHARMACY CLOSES AT 5

## 2012-09-29 NOTE — Telephone Encounter (Signed)
Pt has tried taking the phenergan, miralax, and glycerin suppository and still no relief.  Dr. Conley Rolls advised them to call back if no relief and to try other laxative that she put in chart (lactulose).  Can you rx for him?

## 2012-09-29 NOTE — ED Provider Notes (Signed)
History     CSN: [id number]  Arrival date & time 09/29/12  1818   First MD Initiated Contact with Patient 09/29/12 1853      Chief Complaint  Patient presents with  . Constipation  . possible bowel obstruction     (Consider location/radiation/quality/duration/timing/severity/associated sxs/prior treatment) HPI Comments: Troy Obrien is a 61 y.o. Male with abdominal pain, nausea, and vomiting for 3 days. He is also decreased stooling, with no BM for 3 days. No fever, chills, cough, shortness of breath, or chest pain. He has been posted for repair of right inguinal hernia, a Dr. Carolynne Edouard.  He is taking his regular medicines. He has vomited both food and fluid in the last 24 hours. There are no other modifying factors.  Patient is a 61 y.o. male presenting with constipation. The history is provided by the patient.  Constipation     Past Medical History  Diagnosis Date  . Headache   . Hypertension   . Hyperlipidemia   . Arthritis   . Inguinal hernia   . Gout     Past Surgical History  Procedure Date  . Circumcision 1972  . Tonsillectomy 1963  . Hernia repair     No family history on file.  History  Substance Use Topics  . Smoking status: Never Smoker   . Smokeless tobacco: Never Used  . Alcohol Use: No      Review of Systems  Gastrointestinal: Positive for constipation.  All other systems reviewed and are negative.    Allergies  Laxative  Home Medications   Current Outpatient Rx  Name  Route  Sig  Dispense  Refill  . ALLOPURINOL 100 MG PO TABS   Oral   Take 1 tablet (100 mg total) by mouth daily.   30 tablet   6   . BRIMONIDINE TARTRATE-TIMOLOL 0.2-0.5 % OP SOLN   Both Eyes   Place 1 drop into both eyes every 12 (twelve) hours.         . COLCHICINE 0.6 MG PO TABS   Oral   Take 1 tablet (0.6 mg total) by mouth daily.   15 tablet   1   . EZETIMIBE 10 MG PO TABS   Oral   Take 1 tablet (10 mg total) by mouth daily.   30 tablet   3    Please make an appointment to recheck your cholest ...   . LATANOPROST 0.005 % OP SOLN   Both Eyes   Place 1 drop into both eyes at bedtime.          Marland Kitchen LOSARTAN POTASSIUM 25 MG PO TABS   Oral   Take 25 mg by mouth daily.         . MELOXICAM 15 MG PO TABS   Oral   Take 1 tablet (15 mg total) by mouth daily.   30 tablet   1   . PROMETHAZINE HCL 25 MG PO TABS   Oral   Take 1 tablet (25 mg total) by mouth every 8 (eight) hours as needed for nausea.   20 tablet   0     BP 145/88  Pulse 65  Temp 98.3 F (36.8 C) (Oral)  Resp 19  Ht 5' 7.5" (1.715 m)  Wt 236 lb (107.049 kg)  BMI 36.42 kg/m2  SpO2 96%  Physical Exam  Nursing note and vitals reviewed. Constitutional: He is oriented to person, place, and time. He appears well-developed and well-nourished.  HENT:  Head: Normocephalic and  atraumatic.  Right Ear: External ear normal.  Left Ear: External ear normal.  Eyes: Conjunctivae normal and EOM are normal. Pupils are equal, round, and reactive to light.  Neck: Normal range of motion and phonation normal. Neck supple.  Cardiovascular: Normal rate, regular rhythm, normal heart sounds and intact distal pulses.   Pulmonary/Chest: Effort normal and breath sounds normal. He exhibits no bony tenderness.  Abdominal: Soft. Normal appearance. He exhibits no distension. There is tenderness (diffuse, moderate).  Musculoskeletal: Normal range of motion.  Neurological: He is alert and oriented to person, place, and time. He has normal strength. No cranial nerve deficit or sensory deficit. He exhibits normal muscle tone. Coordination normal.  Skin: Skin is warm, dry and intact.  Psychiatric: He has a normal mood and affect. His behavior is normal. Judgment and thought content normal.    ED Course  Procedures (including critical care time)  Emergency department treatment: IVF, Dilaudid, Zofran.   21:00-Procedure: Manual reduction of right inguinal hernia. Patient verbally  identified. Right groin palpated while flexing the right hip. Using gentle pressure, and manipulation of the hernia, the hernia, was reduced with minimal discomfort. The patient felt better afterwards.    Labs Reviewed  COMPREHENSIVE METABOLIC PANEL - Abnormal; Notable for the following:    Sodium 133 (*)     Chloride 95 (*)     Glucose, Bld 104 (*)     GFR calc non Af Amer 65 (*)     GFR calc Af Amer 76 (*)     All other components within normal limits  CBC WITH DIFFERENTIAL  LIPASE, BLOOD   Dg Abd Acute W/chest  09/29/2012  *RADIOLOGY REPORT*  Clinical Data: Abdominal pain and vomiting.  ACUTE ABDOMEN SERIES (ABDOMEN 2 VIEW & CHEST 1 VIEW)  Comparison: Chest x-ray dated 06/11/2012  Findings: There are some dilated small bowel loops in the midline and right lower abdomen measuring up to approximately 4.4 cm in maximal caliber.  A relative lack of air is identified in the colon.  There is no evidence of free air.  Findings are concerning for developing partial small bowel obstruction.  No abnormal calcifications are identified.  IMPRESSION: Dilated small bowel loops consistent with ileus versus partial small bowel obstruction. No evidence of free intraperitoneal air.  These results were called by telephone on 09/29/2012 at 1736 hours to [facility] at [facility], who verbally acknowledged these results.   Original Report Authenticated By: Irish Lack, M.D.      1. Right inguinal hernia       MDM  Right inguinal hernia; reduced in the emergency department with improvement of symptoms. I suspect, that he has associated constipation. Doubt complete small bowel obstruction. Doubt metabolic instability, serious bacterial infection or impending vascular collapse; the patient is stable for discharge.    Plan: Home Medications- usual, plus stool softener; Home Treatments- fluids, gradually advance diet.; Recommended follow up- Surgeon as scheduled    Flint Melter, MD 09/29/12 2325

## 2012-09-29 NOTE — Progress Notes (Signed)
[facility]:  Office Visit  Chief Complaint:  Chief Complaint  Patient presents with  . Abdominal Pain    sx's started Wednesday  . Constipation    no BM * 3 days  . Inguinal Hernia    surgery scheduled 10/11/12    HPI: Troy Obrien is a 61 y.o. male who complains of  Diffuse abdominal pain. He has had nausea with this, has not vomited, some bloating. Was on vicodin s/p toe nail removal. Has tried fiber without relief. Has had bloating, tried small BM. Hernia repair scheduled for 12/12. Watery, nonbloody stools.   Past Medical History  Diagnosis Date  . Headache   . Hypertension   . Hyperlipidemia   . Arthritis    Past Surgical History  Procedure Date  . Circumcision 1972  . Tonsillectomy 1963  . Hernia repair    History   Social History  . Marital Status: Single    Spouse Name: N/A    Number of Children: N/A  . Years of Education: N/A   Social History Main Topics  . Smoking status: Never Smoker   . Smokeless tobacco: Never Used  . Alcohol Use: Yes  . Drug Use: No  . Sexually Active: Yes    Birth Control/ Protection: None   Other Topics Concern  . None   Social History Narrative  . None   No family history on file. Allergies  Allergen Reactions  . Laxative (Bisacodyl) Hives   Prior to Admission medications   Medication Sig Start Date End Date Taking? Authorizing Provider  brimonidine-timolol (COMBIGAN) 0.2-0.5 % ophthalmic solution Place 1 drop into both eyes every 12 (twelve) hours.   Yes Historical Provider, MD  colchicine 0.6 MG tablet Take 1 tablet (0.6 mg total) by mouth daily. 09/21/12  Yes Peyton Najjar, MD  ezetimibe (ZETIA) 10 MG tablet Take 1 tablet (10 mg total) by mouth daily. 06/13/12 06/13/13 Yes Thao P Le, DO  latanoprost (XALATAN) 0.005 % ophthalmic solution 1 drop at bedtime.   Yes Historical Provider, MD  losartan (COZAAR) 25 MG tablet Take 25 mg by mouth daily.   Yes Historical Provider, MD  meloxicam (MOBIC) 15  MG tablet Take 1 tablet (15 mg total) by mouth daily. 09/15/12  Yes Peyton Najjar, MD  allopurinol (ZYLOPRIM) 100 MG tablet Take 1 tablet (100 mg total) by mouth daily. 09/21/12   Peyton Najjar, MD  cephALEXin (KEFLEX) 500 MG capsule Take 1 capsule (500 mg total) by mouth 3 (three) times daily. 09/15/12   Peyton Najjar, MD  fluticasone (FLONASE) 50 MCG/ACT nasal spray Place 2 sprays into the nose daily. 07/08/12 07/08/13  Peyton Najjar, MD  HYDROcodone-acetaminophen (VICODIN) 5-500 MG per tablet Take 1 tablet by mouth every 4 (four) hours as needed for pain. 09/15/12   Peyton Najjar, MD     ROS: The patient denies fevers, chills, night sweats, unintentional weight loss, chest pain, palpitations, wheezing, dyspnea on exertion,  vomiting, dysuria, hematuria, melena, numbness, weakness, or tingling.  All other systems have been reviewed and were otherwise negative with the exception of those mentioned in the HPI and as above.    PHYSICAL EXAM: Filed Vitals:   09/29/12 1241  BP: 144/81  Pulse: 58  Temp: 98.4 F (36.9 C)  Resp: 16   Filed Vitals:   09/29/12 1241  Height: 5\' 7"  (1.702 m)  Weight: 227 lb (102.967 kg)   Body mass index is 35.55 kg/(m^2).  General:  Alert, mild distress HEENT:  Normocephalic, atraumatic, oropharynx patent.  Cardiovascular:  Regular rate and rhythm, no rubs murmurs or gallops.  No Carotid bruits, radial pulse intact. No pedal edema.  Respiratory: Clear to auscultation bilaterally.  No wheezes, rales, or rhonchi.  No cyanosis, no use of accessory musculature GI: No organomegaly, abdomen is soft and + diffuse tenderness, positive bowel sounds.  No masses. Skin: No rashes. Neurologic: Facial musculature symmetric. Psychiatric: Patient is appropriate throughout our interaction. Lymphatic: No cervical lymphadenopathy Musculoskeletal: Gait intact. + inguinal hernia   LABS: Results for orders placed in visit on 09/15/12  WOUND CULTURE      Component Value  Range   GRAM STAIN No WBC Seen     GRAM STAIN No Squamous Epithelial Cells Seen     GRAM STAIN No Organisms Seen     Organism ID, Bacteria Multiple Organisms Present,None Predominant    POCT GLYCOSYLATED HEMOGLOBIN (HGB A1C)      Component Value Range   Hemoglobin A1C 6.1    URIC ACID      Component Value Range   Uric Acid, Serum 8.4 (*) 4.0 - 7.8 mg/dL     EKG/XRAY:   Primary read interpreted by Dr. Conley Rolls at [facility]. No free air No cardiopulm process + stool Nonobstructive gas pattern   ASSESSMENT/PLAN: Encounter Diagnoses  Name Primary?  . Abdominal pain, acute Yes  . Nausea   . Constipation    Rx promthazine Advise MIralax 17 grams BID, Fleets glycerin enema F/u in 48 hrs or  prn  If not improved then can rx lactulose   LE, THAO PHUONG, DO 09/29/2012 1:49 PM

## 2012-10-01 ENCOUNTER — Telehealth (INDEPENDENT_AMBULATORY_CARE_PROVIDER_SITE_OTHER): Payer: Self-pay | Admitting: General Surgery

## 2012-10-01 NOTE — Telephone Encounter (Signed)
Pt called to report he went to the ER on Saturday night with "severe stomach problems and pain."  Review of records from ER revealed he is constipated and they were able to reduce his hernia.  Sent home with bowel regimen to follow (stool softener and Miralax.)  Pt asking if surgery can be moved forward from planned date of 10/11/12.  Would like to speak with Dr. Carolynne Edouard or his nurse; call after 12:00 (noon.)

## 2012-10-02 ENCOUNTER — Encounter (HOSPITAL_COMMUNITY): Payer: Self-pay

## 2012-10-02 ENCOUNTER — Telehealth (INDEPENDENT_AMBULATORY_CARE_PROVIDER_SITE_OTHER): Payer: Self-pay | Admitting: General Surgery

## 2012-10-02 NOTE — Telephone Encounter (Signed)
Spoke with patient after speaking with Dr. Carolynne Edouard and explained that his surgery cannot be moved up but that Dr. Carolynne Edouard wants him to continue with the stool softeners and to stay with his light duty restrictions of no lifting anything heavier than 25 lbs.

## 2012-10-02 NOTE — Pre-Procedure Instructions (Addendum)
20 TYROME DONATELLI  10/02/2012   Your procedure is scheduled on: Thursday, December 12TH  Report to [facility] at 7:30 AM.  Call this number if you have problems the morning of surgery: [phone]   Remember:              Do not eat any foor or drink any liquids after Midnight Wednesday.              You may take these medications with a SIP of water the morning of surgery: Phenergan (if needed)              Do not wear jewelry.  Do not wear lotions, powders, or cologne. You may NOT wear deodarent.  Men may shave face and neck.   Do not bring valuables to the hospital.  Contacts, dentures or bridgework may not be worn into surgery.   Leave suitcase in the car. After surgery it may be brought to your room.  For patients admitted to the hospital, checkout time is 11:00 AM the day of discharge.   Patients discharged the day of surgery will not be allowed to drive home.   Name and phone number of your driver:    Special Instructions: Shower using CHG 2 nights before surgery and the night before surgery.  If you shower the day of surgery use CHG.  Use special wash - you have one bottle of CHG for all showers.  You should use approximately 1/3 of the bottle for each shower.   Please read over the following fact sheets that you were given: Pain Booklet, Coughing and Deep Breathing, MRSA Information and Surgical Site Infection Prevention

## 2012-10-03 ENCOUNTER — Encounter (HOSPITAL_COMMUNITY): Payer: Self-pay

## 2012-10-03 ENCOUNTER — Encounter (HOSPITAL_COMMUNITY)
Admission: RE | Admit: 2012-10-03 | Discharge: 2012-10-03 | Disposition: A | Discharge status: Home / Self Care | Payer: 59 | Source: Ambulatory Visit | Attending: General Surgery | Admitting: General Surgery

## 2012-10-03 HISTORY — DX: Unspecified glaucoma: H40.9

## 2012-10-03 LAB — SURGICAL PCR SCREEN: MRSA, PCR: NEGATIVE

## 2012-10-03 LAB — CBC
MCH: 27.9 pg (ref 26.0–34.0)
MCHC: 33.5 g/dL (ref 30.0–36.0)
Platelets: UNDETERMINED 10*3/uL (ref 150–400)
RDW: 14.4 % (ref 11.5–15.5)

## 2012-10-03 LAB — BASIC METABOLIC PANEL
BUN: 21 mg/dL (ref 6–23)
Calcium: 9.4 mg/dL (ref 8.4–10.5)
Creatinine, Ser: 1.28 mg/dL (ref 0.50–1.35)
GFR calc non Af Amer: 60 mL/min — ABNORMAL LOW (ref >90)
Glucose, Bld: 100 mg/dL — ABNORMAL HIGH (ref 70–99)

## 2012-10-03 NOTE — Progress Notes (Signed)
10/03/12 1520  OBSTRUCTIVE SLEEP APNEA  Do you snore loudly (loud enough to be heard through closed doors)?  1  Do you often feel tired, fatigued, or sleepy during the daytime? 0  Has anyone observed you stop breathing during your sleep? 1  Do you have, or are you being treated for high blood pressure? 1  BMI more than 35 kg/m2? 1  Age over 59 years old? 1  Neck circumference greater than 40 cm/18 inches? 0  Gender: 1  Obstructive Sleep Apnea Score 6   Score 4 or greater  Results sent to PCP

## 2012-10-03 NOTE — Progress Notes (Addendum)
Patient does not have a primary physician - uses urgent care or emergency room. No cardiac work-up other than ekg in august 2013 in epic

## 2012-10-10 MED ORDER — CEFAZOLIN SODIUM-DEXTROSE 2-3 GM-% IV SOLR
2.0000 g | INTRAVENOUS | Status: AC
  Administered 2012-10-11: 2 g via INTRAVENOUS
  Filled 2012-10-10: qty 50

## 2012-10-10 NOTE — Progress Notes (Signed)
Message left for pt. That surgery time has changed and he is to arrive @ 9:00 AM.

## 2012-10-11 ENCOUNTER — Ambulatory Visit (HOSPITAL_COMMUNITY): Payer: 59 | Admitting: Certified Registered Nurse Anesthetist

## 2012-10-11 ENCOUNTER — Ambulatory Visit (HOSPITAL_COMMUNITY)
Admission: RE | Admit: 2012-10-11 | Discharge: 2012-10-11 | Disposition: A | Discharge status: Home / Self Care | Payer: 59 | Source: Ambulatory Visit | Attending: General Surgery | Admitting: General Surgery

## 2012-10-11 ENCOUNTER — Encounter (HOSPITAL_COMMUNITY): Payer: Self-pay | Admitting: Certified Registered Nurse Anesthetist

## 2012-10-11 ENCOUNTER — Encounter (HOSPITAL_COMMUNITY)
Admission: RE | Disposition: A | Discharge status: Home / Self Care | Payer: Self-pay | Source: Ambulatory Visit | Attending: General Surgery

## 2012-10-11 ENCOUNTER — Encounter (HOSPITAL_COMMUNITY): Payer: Self-pay | Admitting: *Deleted

## 2012-10-11 DIAGNOSIS — G4730 Sleep apnea, unspecified: Secondary | ICD-10-CM | POA: Insufficient documentation

## 2012-10-11 DIAGNOSIS — I10 Essential (primary) hypertension: Secondary | ICD-10-CM | POA: Insufficient documentation

## 2012-10-11 DIAGNOSIS — K4090 Unilateral inguinal hernia, without obstruction or gangrene, not specified as recurrent: Secondary | ICD-10-CM

## 2012-10-11 HISTORY — PX: INGUINAL HERNIA REPAIR: SHX194

## 2012-10-11 HISTORY — PX: INSERTION OF MESH: SHX5868

## 2012-10-11 SURGERY — REPAIR, HERNIA, INGUINAL, ADULT
Anesthesia: General | Site: Groin | Laterality: Right | Wound class: Clean

## 2012-10-11 MED ORDER — HYDROMORPHONE HCL PF 1 MG/ML IJ SOLN
INTRAMUSCULAR | Status: AC
  Filled 2012-10-11: qty 1

## 2012-10-11 MED ORDER — CHLORHEXIDINE GLUCONATE 4 % EX LIQD: 1.0000 "application " | Freq: Once | CUTANEOUS | Status: DC

## 2012-10-11 MED ORDER — HYDROCODONE-ACETAMINOPHEN 5-325 MG PO TABS: 1.0000 | ORAL_TABLET | ORAL | Status: DC | PRN

## 2012-10-11 MED ORDER — BUPIVACAINE-EPINEPHRINE PF 0.25-1:200000 % IJ SOLN
INTRAMUSCULAR | Status: AC
  Filled 2012-10-11: qty 30

## 2012-10-11 MED ORDER — LIDOCAINE HCL (CARDIAC) 20 MG/ML IV SOLN
INTRAVENOUS | Status: DC | PRN
  Administered 2012-10-11: 80 mg via INTRAVENOUS

## 2012-10-11 MED ORDER — LACTATED RINGERS IV SOLN
INTRAVENOUS | Status: DC
  Administered 2012-10-11: 11:00:00 via INTRAVENOUS

## 2012-10-11 MED ORDER — PROMETHAZINE HCL 25 MG/ML IJ SOLN: 6.2500 mg | INTRAMUSCULAR | Status: DC | PRN

## 2012-10-11 MED ORDER — OXYCODONE HCL 5 MG PO TABS
5.0000 mg | ORAL_TABLET | Freq: Once | ORAL | Status: AC | PRN
  Administered 2012-10-11: 5 mg via ORAL

## 2012-10-11 MED ORDER — MEPERIDINE HCL 25 MG/ML IJ SOLN: 6.2500 mg | INTRAMUSCULAR | Status: DC | PRN

## 2012-10-11 MED ORDER — FENTANYL CITRATE 0.05 MG/ML IJ SOLN
INTRAMUSCULAR | Status: DC | PRN
  Administered 2012-10-11 (×5): 50 ug via INTRAVENOUS

## 2012-10-11 MED ORDER — PROPOFOL 10 MG/ML IV BOLUS
INTRAVENOUS | Status: DC | PRN
  Administered 2012-10-11: 200 mg via INTRAVENOUS

## 2012-10-11 MED ORDER — 0.9 % SODIUM CHLORIDE (POUR BTL) OPTIME
TOPICAL | Status: DC | PRN
  Administered 2012-10-11: 1000 mL

## 2012-10-11 MED ORDER — LACTATED RINGERS IV SOLN
INTRAVENOUS | Status: DC | PRN
  Administered 2012-10-11 (×2): via INTRAVENOUS

## 2012-10-11 MED ORDER — MIDAZOLAM HCL 5 MG/5ML IJ SOLN
INTRAMUSCULAR | Status: DC | PRN
  Administered 2012-10-11: 2 mg via INTRAVENOUS

## 2012-10-11 MED ORDER — BUPIVACAINE-EPINEPHRINE 0.25% -1:200000 IJ SOLN
INTRAMUSCULAR | Status: DC | PRN
  Administered 2012-10-11: 30 mL

## 2012-10-11 MED ORDER — OXYCODONE HCL 5 MG/5ML PO SOLN: 5.0000 mg | Freq: Once | ORAL | Status: AC | PRN

## 2012-10-11 MED ORDER — OXYCODONE HCL 5 MG PO TABS
ORAL_TABLET | ORAL | Status: AC
  Filled 2012-10-11: qty 1

## 2012-10-11 MED ORDER — HYDROMORPHONE HCL PF 1 MG/ML IJ SOLN
0.2500 mg | INTRAMUSCULAR | Status: DC | PRN
  Administered 2012-10-11 (×4): 0.5 mg via INTRAVENOUS

## 2012-10-11 SURGICAL SUPPLY — 45 items
BLADE SURG 10 STRL SS (BLADE) ×2 IMPLANT
BLADE SURG 15 STRL LF DISP TIS (BLADE) ×1 IMPLANT
BLADE SURG 15 STRL SS (BLADE) ×1
BLADE SURG ROTATE 9660 (MISCELLANEOUS) ×2 IMPLANT
CHLORAPREP W/TINT 26ML (MISCELLANEOUS) ×2 IMPLANT
CLOTH BEACON ORANGE TIMEOUT ST (SAFETY) ×2 IMPLANT
COVER SURGICAL LIGHT HANDLE (MISCELLANEOUS) ×2 IMPLANT
DECANTER SPIKE VIAL GLASS SM (MISCELLANEOUS) ×2 IMPLANT
DERMABOND ADVANCED (GAUZE/BANDAGES/DRESSINGS) ×1
DERMABOND ADVANCED .7 DNX12 (GAUZE/BANDAGES/DRESSINGS) ×1 IMPLANT
DRAIN PENROSE 1/2X12 LTX STRL (WOUND CARE) ×2 IMPLANT
DRAPE LAPAROSCOPIC ABDOMINAL (DRAPES) ×2 IMPLANT
DRAPE UTILITY 15X26 W/TAPE STR (DRAPE) ×4 IMPLANT
ELECT CAUTERY BLADE 6.4 (BLADE) ×2 IMPLANT
ELECT REM PT RETURN 9FT ADLT (ELECTROSURGICAL) ×2
ELECTRODE REM PT RTRN 9FT ADLT (ELECTROSURGICAL) ×1 IMPLANT
GLOVE BIO SURGEON STRL SZ7.5 (GLOVE) ×6 IMPLANT
GLOVE BIOGEL PI IND STRL 7.5 (GLOVE) ×4 IMPLANT
GLOVE BIOGEL PI INDICATOR 7.5 (GLOVE) ×4
GOWN STRL NON-REIN LRG LVL3 (GOWN DISPOSABLE) ×10 IMPLANT
KIT BASIN OR (CUSTOM PROCEDURE TRAY) ×2 IMPLANT
KIT ROOM TURNOVER OR (KITS) ×2 IMPLANT
MESH ULTRAPRO 3X6 7.6X15CM (Mesh General) ×2 IMPLANT
NEEDLE HYPO 25GX1X1/2 BEV (NEEDLE) ×2 IMPLANT
NS IRRIG 1000ML POUR BTL (IV SOLUTION) ×2 IMPLANT
PACK SURGICAL SETUP 50X90 (CUSTOM PROCEDURE TRAY) ×2 IMPLANT
PAD ARMBOARD 7.5X6 YLW CONV (MISCELLANEOUS) ×2 IMPLANT
PENCIL BUTTON HOLSTER BLD 10FT (ELECTRODE) ×2 IMPLANT
SPONGE LAP 18X18 X RAY DECT (DISPOSABLE) ×2 IMPLANT
SUT MNCRL AB 4-0 PS2 18 (SUTURE) ×2 IMPLANT
SUT PROLENE 2 0 SH DA (SUTURE) ×4 IMPLANT
SUT SILK 2 0 SH (SUTURE) IMPLANT
SUT SILK 3 0 (SUTURE) ×1
SUT SILK 3-0 18XBRD TIE 12 (SUTURE) ×1 IMPLANT
SUT VIC AB 0 CT1 27 (SUTURE) ×1
SUT VIC AB 0 CT1 27XBRD ANBCTR (SUTURE) ×1 IMPLANT
SUT VIC AB 2-0 SH 27 (SUTURE) ×1
SUT VIC AB 2-0 SH 27X BRD (SUTURE) ×1 IMPLANT
SUT VIC AB 3-0 SH 27 (SUTURE) ×1
SUT VIC AB 3-0 SH 27XBRD (SUTURE) ×1 IMPLANT
SYR BULB 3OZ (MISCELLANEOUS) ×2 IMPLANT
SYR CONTROL 10ML LL (SYRINGE) ×2 IMPLANT
TOWEL OR 17X24 6PK STRL BLUE (TOWEL DISPOSABLE) ×2 IMPLANT
TOWEL OR 17X26 10 PK STRL BLUE (TOWEL DISPOSABLE) ×2 IMPLANT
WATER STERILE IRR 1000ML POUR (IV SOLUTION) IMPLANT

## 2012-10-11 NOTE — Interval H&P Note (Signed)
History and Physical Interval Note:  10/11/2012 11:25 AM  Troy Obrien  has presented today for surgery, with the diagnosis of Right inguinal hernia  The various methods of treatment have been discussed with the patient and family. After consideration of risks, benefits and other options for treatment, the patient has consented to  Procedure(s) (LRB) with comments: HERNIA REPAIR INGUINAL ADULT (Right) INSERTION OF MESH (Right) as a surgical intervention .  The patient's history has been reviewed, patient examined, no change in status, stable for surgery.  I have reviewed the patient's chart and labs.  Questions were answered to the patient's satisfaction.     TOTH III,Thalia Turkington S

## 2012-10-11 NOTE — Transfer of Care (Signed)
Immediate Anesthesia Transfer of Care Note  Patient: Troy Obrien  Procedure(s) Performed: Procedure(s) (LRB) with comments: HERNIA REPAIR INGUINAL ADULT (Right) INSERTION OF MESH (Right)  Patient Location: PACU  Anesthesia Type:General  Level of Consciousness: sedated  Airway & Oxygen Therapy: Patient Spontanous Breathing and Patient connected to nasal cannula oxygen  Post-op Assessment: Report given to PACU RN, Post -op Vital signs reviewed and stable and Patient moving all extremities  Post vital signs: Reviewed and stable  Complications: No apparent anesthesia complications

## 2012-10-11 NOTE — Op Note (Signed)
10/11/2012  1:38 PM  PATIENT:  Troy Obrien  61 y.o. male  PRE-OPERATIVE DIAGNOSIS:  RIGHT INGUINAL HERNIA  POST-OPERATIVE DIAGNOSIS:  RIGHT INGUINAL HERNIA  PROCEDURE:  Procedure(s) (LRB) with comments: HERNIA REPAIR INGUINAL ADULT (Right) INSERTION OF MESH (Right)  SURGEON:  Surgeon(s) and Role:    * Robyne Askew, MD - Primary  PHYSICIAN ASSISTANT:   ASSISTANTS: none   ANESTHESIA:   general  EBL:  Total I/O In: 1100 [I.V.:1100] Out: -   BLOOD ADMINISTERED:none  DRAINS: none   LOCAL MEDICATIONS USED:  MARCAINE     SPECIMEN:  No Specimen  DISPOSITION OF SPECIMEN:  N/A  COUNTS:  YES  TOURNIQUET:  * No tourniquets in log *  DICTATION: .Dragon Dictation After informed consent was obtained the patient was brought to the operating room and placed in the supine position on the operating room table. After adequate induction of general anesthesia the patient's abdomen in the right groin were prepped with corpora, allowed to dry, and draped in usual sterile manner. The right groin there was infiltrated with quarter percent Marcaine. A small incision was made from the edge of the pubic tubercle on the right towards the anterior superior iliac spine. This incision was carried through the subcutaneous tissue sharply with the electrocautery until the fascia of the external oblique was encountered. A small bridging vein was clamped with hemostats divided and ligated with 3-0 silk ties. The external oblique fascia was opened along its fibers towards the apex of the external ring. A wheatland retractor was deployed. Blunt dissection was carried out of the cord structures at the edge of the pubic tubercle until they could be surrounded between 2 fingers. A half-inch Penrose drain was placed around the cord structures for retraction purposes. Just medial to the cord there was a broad-based bulging through the floor of the canal. This was readily reduced and the floor of the canal  repaired with interrupted 0 Vicryl stitches. A 3 x 6 piece of ultra Pro mesh was then chosen and cut to fit. The mesh was sewn inferiorly to the shelving edge of the inguinal ligament with a running 2-0 Prolene stitch. Tails were cut in the mesh laterally and the tails were wrapped around the cord structures. Superiorly the mesh was sewed to the musculoaponeurotic strength layer of the transversalis with interrupted 2-0 Prolene vertical mattress stitches. Lateral to the cord the tails of the mesh were anchored to the shelving edge of the inguinal ligament with an interrupted 2-0 Prolene stitch. Once this was accomplished the hernia appeared be well repaired and the mesh was in good position. The wound was irrigated with copious amounts of saline. The ilioinguinal nerve was not seen during the dissection. The external oblique fascia was the reapproximated with a running 2-0 Vicryl stitch. Prior to closing the cord was gently skeletonized and no other evidence of hernia was found. The subcutaneous fascia was closed with a running 3-0 Vicryl stitch. The skin was) 4-0 Monocryl subcuticular stitch. Dermabond dressings were applied. The patient tolerated the procedure well. At the end of the case all needle sponge and instrument counts were correct. The patient was then awakened and taken to recovery in stable condition. The patient's testicles in the scrotum at the end of the case.  PLAN OF CARE: Discharge to home after PACU  PATIENT DISPOSITION:  PACU - hemodynamically stable.   Delay start of Pharmacological VTE agent (>24hrs) due to surgical blood loss or risk of bleeding: not applicable

## 2012-10-11 NOTE — Anesthesia Postprocedure Evaluation (Signed)
  Anesthesia Post-op Note  Patient: Troy Obrien  Procedure(s) Performed: Procedure(s) (LRB) with comments: HERNIA REPAIR INGUINAL ADULT (Right) INSERTION OF MESH (Right)  Patient Location: PACU  Anesthesia Type:General  Level of Consciousness: awake  Airway and Oxygen Therapy: Patient Spontanous Breathing  Post-op Pain: mild  Post-op Assessment: Post-op Vital signs reviewed  Post-op Vital Signs: stable  Complications: No apparent anesthesia complications

## 2012-10-11 NOTE — Anesthesia Preprocedure Evaluation (Signed)
Anesthesia Evaluation  Patient identified by MRN, date of birth, ID band Patient awake    Airway Mallampati: II      Dental  (+) Teeth Intact   Pulmonary sleep apnea ,  breath sounds clear to auscultation        Cardiovascular hypertension, Rhythm:Regular Rate:Normal     Neuro/Psych    GI/Hepatic negative GI ROS, Neg liver ROS,   Endo/Other  negative endocrine ROS  Renal/GU negative Renal ROS     Musculoskeletal   Abdominal (+) + obese,   Peds  Hematology negative hematology ROS (+)   Anesthesia Other Findings   Reproductive/Obstetrics                           Anesthesia Physical Anesthesia Plan  ASA: II  Anesthesia Plan: General   Post-op Pain Management:    Induction: Intravenous  Airway Management Planned: LMA  Additional Equipment:   Intra-op Plan:   Post-operative Plan: Extubation in OR  Informed Consent: I have reviewed the patients History and Physical, chart, labs and discussed the procedure including the risks, benefits and alternatives for the proposed anesthesia with the patient or authorized representative who has indicated his/her understanding and acceptance.   Dental advisory given  Plan Discussed with:   Anesthesia Plan Comments:         Anesthesia Quick Evaluation

## 2012-10-11 NOTE — Preoperative (Signed)
Beta Blockers   Reason not to administer Beta Blockers:Not Applicable 

## 2012-10-11 NOTE — H&P (Signed)
Troy Obrien  Description:  61 year old male  09/06/2012 4:30 PM Office Visit Provider:  Robyne Askew, MD  MRN: [id number] Department:  [facility]            Diagnoses  Reason for Visit    Inguinal hernia, right - Primary  Routine Post Op   550.90  reck ING hernia / schedule Sx           Vitals - Last Recorded       BP  Pulse  Temp  Resp  Ht  Wt    126/74  64  97.8 F (36.6 C) (Temporal)  20  5' 7.5" (1.715 m)  233 lb 3.2 oz (105.779 kg)          BMI               35.99 kg/m2                   Progress Notes     Robyne Askew, MD 09/10/2012 8:54 AM Signed    Subjective:    Patient ID: Troy Obrien, male DOB: 01/25/1951, 61 y.o. MRN: [id number]  HPI  The patient is a 61 year old black male who we saw back in May with a right inguinal hernia. At that time he got nervous about scheduling surgery and canceled it. Since that time he has continued to have some discomfort and bulging in the right groin. He denies any nausea or vomiting. He does a lot of heavy lifting at work and is now ready to schedule surgery.  Review of Systems  Constitutional: Negative.  HENT: Negative.  Eyes: Negative.  Respiratory: Negative.  Cardiovascular: Negative.  Gastrointestinal: Negative.  Genitourinary: Negative.  Musculoskeletal: Negative.  Skin: Negative.  Neurological: Negative.  Hematological: Negative.  Psychiatric/Behavioral: Negative.      Objective:    Physical Exam  Constitutional: He is oriented to person, place, and time. He appears well-developed and well-nourished.  HENT:  Head: Normocephalic and atraumatic.  Eyes: Conjunctivae normal and EOM are normal. Pupils are equal, round, and reactive to light.  Neck: Normal range of motion. Neck supple.  Cardiovascular: Normal rate, regular rhythm and normal heart sounds.  Pulmonary/Chest: Effort normal and breath sounds normal.  Abdominal: Soft. Bowel sounds are normal.  Genitourinary:   There is a moderate-sized bulge in the right groin that does reduce. There is no palpable bulge or impulse with straining in the left groin  Musculoskeletal: Normal range of motion.  Neurological: He is alert and oriented to person, place, and time.  Skin: Skin is warm and dry.  Psychiatric: He has a normal mood and affect. His behavior is normal.      Assessment:     The patient has a symptomatic moderate size right inguinal hernia. Because of the risk of incarceration and stimulation I think he would benefit from having this fixed. I've discussed with him in detail the risks and benefits of the operation a fixed hernia as well as some of the technical aspects and he understands and wishes to proceed.     Plan:     Plan for right inguinal hernia repair with mesh.              Not recorded                  Discontinued Medications         Reason for  Discontinue    cefdinir (OMNICEF) 300 MG capsule  Error                 Patient Instructions     Plan for right inguinal hernia repair with mesh          Level of Service     PR OFFICE OUTPATIENT VISIT 10 MINUTES [95638]           All Flowsheet Templates (all recorded)     Encounter Vitals Flowsheet   Custom Formula Data Flowsheet   Anthropometrics Flowsheet                           Referring Provider          Kristian Covey, MD            All Charges for This Encounter       Code  Description  Service Date  Service Provider  Modifiers  Quantity    [phone]  PR OFFICE OUTPATIENT VISIT 10 MINUTES  09/06/2012  Robyne Askew, MD   1                Other Encounter Related Information     Allergies & Medications      Problem List      History      Patient-Entered Questionnaires      Printed AVS Reports       Printed at    Printed by    09/06/2012 5:16 PM  After Visit Summary  Robyne Askew, MD           No data filed

## 2012-10-12 ENCOUNTER — Encounter (HOSPITAL_COMMUNITY): Payer: Self-pay | Admitting: General Surgery

## 2012-10-12 ENCOUNTER — Telehealth (INDEPENDENT_AMBULATORY_CARE_PROVIDER_SITE_OTHER): Payer: Self-pay

## 2012-10-12 NOTE — Telephone Encounter (Signed)
Pt had inguinal hernia repair yesterday by Dr. Carolynne Edouard.  He was given Hydrocodone 5/325 for pain.  He states these are not working for him at all.  He is unable to rest at all because of the pain.  Dr. Carolynne Edouard is unavailable today, so this request will be given to our Urgent Office doctor.

## 2012-10-15 ENCOUNTER — Telehealth (INDEPENDENT_AMBULATORY_CARE_PROVIDER_SITE_OTHER): Payer: Self-pay | Admitting: General Surgery

## 2012-10-15 ENCOUNTER — Other Ambulatory Visit (INDEPENDENT_AMBULATORY_CARE_PROVIDER_SITE_OTHER): Payer: Self-pay | Admitting: Surgery

## 2012-10-15 MED ORDER — OXYCODONE HCL 5 MG PO TABS: 5.0000 mg | ORAL_TABLET | ORAL | Status: DC | PRN

## 2012-10-15 NOTE — Telephone Encounter (Signed)
I called and informed the pt that Dr Michaell Cowing wrote him Oxycodone.  It is ready for pick up.  I put the prescription at the front check in .

## 2012-10-15 NOTE — Telephone Encounter (Signed)
Patient called and stated that he called on 10-12-12 and spoke to [facility] to get something stronger for the pain from Hernia surgery back on 10-11-12 and he stated that the Rx that he got from the office is the same Rx that he got from the hospital and that was Hydrocodone 5/325. Dr Carolynne Edouard is not available to be paged I told him that I will have to ask the Dr who is doing he urgent office today and that is Dr Michaell Cowing. And I told the pt that he may or may not get a Rx for pain that it is up to the urgent office Dr

## 2012-10-15 NOTE — Progress Notes (Signed)
Will try oxycodone #50

## 2012-10-22 ENCOUNTER — Telehealth (INDEPENDENT_AMBULATORY_CARE_PROVIDER_SITE_OTHER): Payer: Self-pay | Admitting: General Surgery

## 2012-10-22 NOTE — Telephone Encounter (Signed)
Pt called to ask if OK to have sex yet.  Discussed inguinal hernia repair and probable swelling of his scrotum and penis----this should be totally resolved first.  Also, pt instructed to avoid prolonged intercourse; if it hurts, then stop.  Consider optional ways to satisfy partner.  He will call back prn.

## 2012-10-30 ENCOUNTER — Encounter (INDEPENDENT_AMBULATORY_CARE_PROVIDER_SITE_OTHER): Payer: Self-pay | Admitting: General Surgery

## 2012-10-30 ENCOUNTER — Ambulatory Visit (INDEPENDENT_AMBULATORY_CARE_PROVIDER_SITE_OTHER): Payer: 59 | Admitting: General Surgery

## 2012-10-30 VITALS — BP 142/80 | HR 74 | Temp 97.6°F | Resp 18 | Ht 67.5 in | Wt 230.5 lb

## 2012-10-30 DIAGNOSIS — K4090 Unilateral inguinal hernia, without obstruction or gangrene, not specified as recurrent: Secondary | ICD-10-CM

## 2012-10-30 NOTE — Patient Instructions (Signed)
No strenuous activity May alternate percocet with ibuprofen

## 2012-10-30 NOTE — Progress Notes (Signed)
Subjective:     Patient ID: Troy Obrien, male   DOB: 1951/08/27, 61 y.o.   MRN: [id number]  HPI The patient is a 61 year old black male who is 2-1/2 weeks status post right inguinal hernia repair with mesh. He is complaining of a lot of pain but actually is having significantly less pain than what he was having prior to surgery. Even with all of the pain he complains of the still wants clearance to have sex at home. He denies any nausea or vomiting. His appetite is good and his bowels are working normally.  Review of Systems     Objective:   Physical Exam On exam his abdomen is soft and nontender. His right groin incision is healing nicely with no sign of infection. There is no palpable evidence for recurrence of the hernia.    Assessment:     2-1/2 weeks status post right inguinal hernia repair with mesh    Plan:     At this point I think time will help his pain resolve. I will write him for a stronger dose of Percocet at home. We will plan to see him back in about 3-4 weeks to check his progress. I have encouraged him not to do any strenuous activity

## 2012-11-06 ENCOUNTER — Telehealth (INDEPENDENT_AMBULATORY_CARE_PROVIDER_SITE_OTHER): Payer: Self-pay | Admitting: General Surgery

## 2012-11-06 NOTE — Telephone Encounter (Signed)
Patient called and made aware RX written and at the front for pick up.

## 2012-11-06 NOTE — Telephone Encounter (Signed)
Patient called for refill of oxycodone 10/325 #60. Please advise.

## 2012-11-15 ENCOUNTER — Telehealth (INDEPENDENT_AMBULATORY_CARE_PROVIDER_SITE_OTHER): Payer: Self-pay | Admitting: General Surgery

## 2012-11-15 NOTE — Telephone Encounter (Signed)
Pt called to request additional pain meds.  He has had several refills already, so paged and updated Dr. Carolynne Edouard.  Agreed to one final refill:  Percocet 10/325, # 40, 1-2 po Q4-6H prn, no refills.  Will ask MD in the office to sign for Dr. Carolynne Edouard.

## 2012-11-15 NOTE — Telephone Encounter (Signed)
I called pt to inform him that Dr. Carolynne Edouard approved pain med refill, oxycodone 10/325 #40 only. Pt notified that no more will be written per t.o. Dr. Toth/ pt aware that one of Dr. Billey Chang partners will write order and we will call pt when ready/gy

## 2012-11-22 ENCOUNTER — Encounter (INDEPENDENT_AMBULATORY_CARE_PROVIDER_SITE_OTHER): Payer: Self-pay | Admitting: General Surgery

## 2012-11-22 ENCOUNTER — Encounter (INDEPENDENT_AMBULATORY_CARE_PROVIDER_SITE_OTHER): Payer: Self-pay

## 2012-11-22 ENCOUNTER — Ambulatory Visit (INDEPENDENT_AMBULATORY_CARE_PROVIDER_SITE_OTHER): Payer: 59 | Admitting: General Surgery

## 2012-11-22 VITALS — BP 132/82 | HR 80 | Resp 16 | Ht 67.5 in | Wt 245.0 lb

## 2012-11-22 DIAGNOSIS — K4090 Unilateral inguinal hernia, without obstruction or gangrene, not specified as recurrent: Secondary | ICD-10-CM

## 2012-11-22 MED ORDER — HYDROCODONE-ACETAMINOPHEN 10-325 MG PO TABS: 1.0000 | ORAL_TABLET | Freq: Four times a day (QID) | ORAL | Status: DC | PRN

## 2012-11-22 NOTE — Patient Instructions (Signed)
May return to all normal activities at 8 weeks from surgery

## 2012-11-30 ENCOUNTER — Telehealth (INDEPENDENT_AMBULATORY_CARE_PROVIDER_SITE_OTHER): Payer: Self-pay

## 2012-11-30 NOTE — Progress Notes (Signed)
Subjective:     Patient ID: Troy Obrien, male   DOB: [birth year], 62 y.o.   MRN: [id number]  HPI The patient is a 62 year old white male who is almost 6 weeks out from a right inguinal hernia repair with mesh. His soreness is significantly improved but he still feels that if he moves too much. His appetite has been good and his bowels are working normally.  Review of Systems     Objective:   Physical Exam On exam his right groin incision is healing nicely with no sign of infection. His abdomen is soft and nontender. There is no palpable evidence for recurrence of the hernia    Assessment:     6 weeks status post right inguinal hernia repair with mesh    Plan:     At this point I think he can begin returning to his normal activities without any restrictions. We will plan to see him back on a when necessary basis

## 2012-11-30 NOTE — Telephone Encounter (Signed)
Pt called requesting refill of oxycodone. Pt was given hydrocodone #60 11-22-12 and states it does control his discomfort. Reviewed with Dr Carolynne Edouard. Per Dr Carolynne Edouard no more refills of narcotics will be given. Pt advised to contact Dr Alwyn Ren or Dr Marton Redwood for future pain medication refills.

## 2012-12-04 ENCOUNTER — Telehealth: Payer: Self-pay

## 2012-12-04 ENCOUNTER — Ambulatory Visit: Payer: 59

## 2012-12-04 ENCOUNTER — Ambulatory Visit (INDEPENDENT_AMBULATORY_CARE_PROVIDER_SITE_OTHER): Payer: 59 | Admitting: Family Medicine

## 2012-12-04 ENCOUNTER — Other Ambulatory Visit: Payer: Self-pay | Admitting: Family Medicine

## 2012-12-04 VITALS — BP 126/76 | HR 51 | Temp 97.8°F | Resp 16 | Ht 67.0 in | Wt 237.4 lb

## 2012-12-04 DIAGNOSIS — M79609 Pain in unspecified limb: Secondary | ICD-10-CM

## 2012-12-04 DIAGNOSIS — J3489 Other specified disorders of nose and nasal sinuses: Secondary | ICD-10-CM

## 2012-12-04 DIAGNOSIS — K4090 Unilateral inguinal hernia, without obstruction or gangrene, not specified as recurrent: Secondary | ICD-10-CM

## 2012-12-04 DIAGNOSIS — M79645 Pain in left finger(s): Secondary | ICD-10-CM

## 2012-12-04 DIAGNOSIS — E785 Hyperlipidemia, unspecified: Secondary | ICD-10-CM

## 2012-12-04 DIAGNOSIS — R0981 Nasal congestion: Secondary | ICD-10-CM

## 2012-12-04 DIAGNOSIS — Z0000 Encounter for general adult medical examination without abnormal findings: Secondary | ICD-10-CM

## 2012-12-04 DIAGNOSIS — M25549 Pain in joints of unspecified hand: Secondary | ICD-10-CM

## 2012-12-04 LAB — COMPREHENSIVE METABOLIC PANEL
ALT: 22 U/L (ref 0–53)
AST: 17 U/L (ref 0–37)
CO2: 28 mEq/L (ref 19–32)
Chloride: 104 mEq/L (ref 96–112)
Creat: 1.26 mg/dL (ref 0.50–1.35)
Sodium: 138 mEq/L (ref 135–145)
Total Bilirubin: 0.4 mg/dL (ref 0.3–1.2)
Total Protein: 8.1 g/dL (ref 6.0–8.3)

## 2012-12-04 LAB — POCT CBC
Granulocyte percent: 56.1 % (ref 37–80)
HCT, POC: 40.1 % — AB (ref 43.5–53.7)
Hemoglobin: 12.9 g/dL — AB (ref 14.1–18.1)
Lymph, poc: 1.7 (ref 0.6–3.4)
MCH, POC: 27.2 pg (ref 27–31.2)
MCHC: 32.2 g/dL (ref 31.8–35.4)
MCV: 84.7 fL (ref 80–97)
MID (cbc): 0.5 (ref 0–0.9)
MPV: 9.4 fL (ref 0–99.8)
POC Granulocyte: 2.8 (ref 2–6.9)
POC LYMPH PERCENT: 34.8 % (ref 10–50)
POC MID %: 9.1 % (ref 0–12)
Platelet Count, POC: 290 10*3/uL (ref 142–424)
RBC: 4.74 M/uL (ref 4.69–6.13)
RDW, POC: 14.3 %
WBC: 5 10*3/uL (ref 4.6–10.2)

## 2012-12-04 LAB — POCT URINALYSIS DIPSTICK
Bilirubin, UA: NEGATIVE
Glucose, UA: NEGATIVE
Ketones, UA: NEGATIVE
Leukocytes, UA: NEGATIVE
Nitrite, UA: NEGATIVE
Protein, UA: NEGATIVE
Spec Grav, UA: 1.02
Urobilinogen, UA: 0.2
pH, UA: 6.5

## 2012-12-04 LAB — LIPID PANEL
Cholesterol: 207 mg/dL — ABNORMAL HIGH (ref 0–200)
LDL Cholesterol: 122 mg/dL — ABNORMAL HIGH (ref 0–99)
Total CHOL/HDL Ratio: 3.8 Ratio
VLDL: 30 mg/dL (ref 0–40)

## 2012-12-04 LAB — IFOBT (OCCULT BLOOD): IFOBT: NEGATIVE

## 2012-12-04 LAB — POCT UA - MICROSCOPIC ONLY
Casts, Ur, LPF, POC: NEGATIVE
Crystals, Ur, HPF, POC: NEGATIVE
Mucus, UA: NEGATIVE
Yeast, UA: NEGATIVE

## 2012-12-04 LAB — TSH: TSH: 0.544 u[IU]/mL (ref 0.350–4.500)

## 2012-12-04 LAB — POCT SEDIMENTATION RATE: POCT SED RATE: 18 mm/hr (ref 0–22)

## 2012-12-04 LAB — URIC ACID: Uric Acid, Serum: 5.7 mg/dL (ref 4.0–7.8)

## 2012-12-04 MED ORDER — PREDNISONE 20 MG PO TABS: ORAL_TABLET | ORAL | Status: DC

## 2012-12-04 MED ORDER — FLUTICASONE PROPIONATE 50 MCG/ACT NA SUSP: 2.0000 | Freq: Every day | NASAL | Status: DC

## 2012-12-04 MED ORDER — HYDROCODONE-ACETAMINOPHEN 10-325 MG PO TABS: 1.0000 | ORAL_TABLET | Freq: Four times a day (QID) | ORAL | Status: DC | PRN

## 2012-12-04 MED ORDER — LOSARTAN POTASSIUM 25 MG PO TABS: 25.0000 mg | ORAL_TABLET | Freq: Every evening | ORAL | Status: DC

## 2012-12-04 MED ORDER — MELOXICAM 15 MG PO TABS: 15.0000 mg | ORAL_TABLET | Freq: Every day | ORAL | Status: DC

## 2012-12-04 NOTE — Telephone Encounter (Signed)
Noted, thank you

## 2012-12-04 NOTE — Patient Instructions (Signed)
Hot showers or breathing in steam may help loosen the congestion.  Using a netti pot or sinus rinse is also likely to help you feel better and keep this from progressing. Use the fluticasone nasal spray every night before bed for at least 2 weeks.  I recommend augmenting with 12 hr sudafed (behind the counter) and generic mucinex to help you move out the congestion.  If no improvement or you are getting worse, come back as you might need another course of steroids but hopefully with all of the above, you can avoid it.

## 2012-12-04 NOTE — Telephone Encounter (Signed)
Pharmacist called and stated that they just received a Rx from Dr Clelia Croft for [facility] and pt just had Rx filled yesterday for the exact medication #60 from Dr Carolynne Edouard. Pharmacist wanted authorization to cancel Dr Alver Fisher Rx which I gave to him. Dr Clelia Croft forwarding this FYI.

## 2012-12-04 NOTE — Progress Notes (Signed)
Subjective:    Patient ID: Troy Obrien, male    DOB: January 12, 1951, 62 y.o.   MRN: [id number] Chief Complaint  Patient presents with  . Annual Exam  . Gout    X 6 MONTHS left index    HPI  For the last 6 mos has had severe pain in left second finger MCP joint - can hardly bend it. Is taking allopurinol now as had been told it was gout but getting worse.  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 He has been out of work for the past several mos, since Dec 11, as he has been recovering from an inguinal hernia surgery (for which he has been on norco 10mg  which does dull the pain some.) Has one of is first musical rehersals coming up tomorrow and worried he is not going to be able to play.    Fiance told him that at night while he is sleeping he only breaths through is mouth and sounds like he has a lot of congestion and mucous in his chest, hears him stop breathing, snores, a lot of daytime fatigue.  Aches a lot, sore, stiff, sluggish.  Pt reports he had a colonoscopy last yr in [place] and it was normal. He states he had a lot of discomfort and pain for a long time after so reluctant to ever do again.  Past Medical History  Diagnosis Date  . Headache   . Hypertension   . Hyperlipidemia   . Arthritis   . Inguinal hernia   . Gout   . Glaucoma    Past Surgical History  Procedure Date  . Circumcision 1972  . Tonsillectomy 1963  . Carpel tunnel     bilateral  . Inguinal hernia repair 10/11/2012    Procedure: HERNIA REPAIR INGUINAL ADULT;  Surgeon: Robyne Askew, MD;  Location: [facility] OR;  Service: General;  Laterality: Right;  . Insertion of mesh 10/11/2012    Procedure: INSERTION OF MESH;  Surgeon: Robyne Askew, MD;  Location: [facility] OR;  Service: General;  Laterality: Right;   No family history on file. History   Social History  . Marital Status: Single    Spouse Name: N/A    Number of Children: N/A  . Years of  Education: N/A   Social History Main Topics  . Smoking status: Never Smoker   . Smokeless tobacco: Never Used  . Alcohol Use: No  . Drug Use: No  . Sexually Active: Yes    Birth Control/ Protection: None   Other Topics Concern  . None   Social History Narrative  . None   Current Outpatient Prescriptions on File Prior to Visit  Medication Sig Dispense Refill  . allopurinol (ZYLOPRIM) 100 MG tablet Take 1 tablet (100 mg total) by mouth daily.  30 tablet  6  . brimonidine-timolol (COMBIGAN) 0.2-0.5 % ophthalmic solution Place 1 drop into both eyes at bedtime.       Marland Kitchen latanoprost (XALATAN) 0.005 % ophthalmic solution Place 1 drop into both eyes at bedtime.       Marland Kitchen losartan (COZAAR) 25 MG tablet Take 25 mg by mouth every evening.       . colchicine 0.6 MG tablet Take 1 tablet (0.6 mg total) by mouth daily.  15 tablet  1  . ezetimibe (ZETIA) 10 MG tablet Take 1 tablet (10 mg total) by mouth daily.  30 tablet  3  . HYDROcodone-acetaminophen (NORCO) 10-325 MG per tablet Take 1 tablet by mouth every 6 (six) hours as needed for pain.  60 tablet  1  . meloxicam (MOBIC) 15 MG tablet Take 1 tablet (15 mg total) by mouth daily.  30 tablet  1   Allergies  Allergen Reactions  . Laxative (Bisacodyl) Hives     Review of Systems  Constitutional: Positive for fatigue. Negative for fever, chills, activity change and appetite change.  HENT: Positive for congestion and rhinorrhea. Negative for ear pain and sore throat.   Eyes: Negative for visual disturbance.  Respiratory: Positive for apnea and cough. Negative for shortness of breath.   Cardiovascular: Negative for chest pain and leg swelling.  Gastrointestinal: Negative for nausea, vomiting, abdominal pain, diarrhea, constipation, blood in stool, anal bleeding and rectal pain.  Genitourinary: Negative for dysuria, urgency, frequency, hematuria, decreased urine volume and difficulty urinating.  Musculoskeletal: Positive for myalgias, back pain,  joint swelling and arthralgias. Negative for gait problem.  Skin: Negative for rash and wound.  Neurological: Positive for weakness. Negative for dizziness, numbness and headaches.  Hematological: Negative for adenopathy. Does not bruise/bleed easily.  Psychiatric/Behavioral: Positive for sleep disturbance. Negative for dysphoric mood.      BP 126/76  Pulse 51  Temp 97.8 F (36.6 C) (Oral)  Resp 16  Ht 5\' 7"  (1.702 m)  Wt 237 lb 6.4 oz (107.684 kg)  BMI 37.18 kg/m2  SpO2 99% Objective:   Physical Exam  Constitutional: He is oriented to person, place, and time. He appears well-developed and well-nourished. No distress.  HENT:  Head: Normocephalic and atraumatic.  Right Ear: Tympanic membrane, external ear and ear canal normal.  Left Ear: Tympanic membrane, external ear and ear canal normal.  Nose: Mucosal edema and rhinorrhea present.  Mouth/Throat: Uvula is midline, oropharynx is clear and moist and mucous membranes are normal. No oropharyngeal exudate.       Nares w/ copious clear and some purulent rhinorrhea and both lower turbinates completely swollen sug  Eyes: Conjunctivae normal are normal. Right eye exhibits no discharge. Left eye exhibits no discharge. No scleral icterus.  Neck: Normal range of motion. Neck supple. No thyromegaly present.  Cardiovascular: Normal rate, regular rhythm, normal heart sounds and intact distal pulses.   Pulmonary/Chest: Effort normal and breath sounds normal. No respiratory distress.  Abdominal: Soft. Bowel sounds are normal. He exhibits no distension and no mass. There is no tenderness. There is no rebound and no guarding.  Genitourinary: Rectum normal and prostate normal. Rectal exam shows no external hemorrhoid, no internal hemorrhoid, no fissure, no mass, no tenderness and anal tone normal. Guaiac negative stool. Prostate is not enlarged and not tender.  Musculoskeletal: He exhibits no edema.       Right hand: He exhibits decreased range of  motion, tenderness and bony tenderness. He exhibits normal capillary refill. normal sensation noted.       Rt 2nd (index) finger MCP and PIP joint VERY tender to palpation on palmar aspect and over flexor tendon with sig decreased ROM, no erythema, warmth, mild edema.  Lymphadenopathy:    He has no cervical adenopathy.  Neurological: He is alert and oriented to person, place, and time. He has normal reflexes. No cranial nerve deficit. He exhibits normal muscle tone.  Skin: Skin is warm and dry. No rash noted. He is not diaphoretic. No erythema.  Psychiatric: He has a normal mood and affect. His behavior is normal.  Results for orders placed in visit on 12/04/12  POCT CBC      Component Value Range   WBC 5.0  4.6 - 10.2 K/uL   Lymph, poc 1.7  0.6 - 3.4   POC LYMPH PERCENT 34.8  10 - 50 %L   MID (cbc) 0.5  0 - 0.9   POC MID % 9.1  0 - 12 %M   POC Granulocyte 2.8  2 - 6.9   Granulocyte percent 56.1  37 - 80 %G   RBC 4.74  4.69 - 6.13 M/uL   Hemoglobin 12.9 (*) 14.1 - 18.1 g/dL   HCT, POC 19.1 (*) 47.8 - 53.7 %   MCV 84.7  80 - 97 fL   MCH, POC 27.2  27 - 31.2 pg   MCHC 32.2  31.8 - 35.4 g/dL   RDW, POC 29.5     Platelet Count, POC 290  142 - 424 K/uL   MPV 9.4  0 - 99.8 fL   [facility] reading (PRIMARY) by  Dr. Clelia Croft No acute abnormlities.   Assessment & Plan:     1. Annual physical exam - add on psa. At f/u, need to record last TDaP POCT CBC, Comprehensive metabolic panel, Lipid panel, TSH, Uric Acid, POCT SEDIMENTATION RATE, POCT UA - Microscopic Only, POCT urinalysis dipstick, IFOBT POC (occult bld, rslt in office)  2. Nasal congestion - very severe - nares completely swollen shut, course of prednisone should help and start netti pot/sinus rinse as well as qhs flonase which he needs to continue indefinitely POCT CBC, predniSONE (DELTASONE) 20 MG tablet  3. Finger pain, left - try burst of prednisone to decrease inflammation and refer to hand surgery since inhibiting pt's  livelihood. Restart meloxicam DG Finger Index Left, Ambulatory referral to Hand Surgery, predniSONE (DELTASONE) 20 MG tablet, DISCONTINUED: HYDROcodone-acetaminophen (NORCO) 10-325 MG per tablet  4. HTN Well controlled, cont losartan  5. Pain, joint, hand - 2nd MCP and PIP joint pain. I do not think this is gout since more than 1 joint is hurting meloxicam (MOBIC) 15 MG tablet  6. Hyperlipidemia - on zetia but last rx with 4 mo supply in August so pt must not be completely compliant. Recheck. Goal LDL<130   7. Gout - on chart review - I am not completely sure that pt has ever had a real gout flair vs other arthritis and sxs haven't improved since starting on allopurinol so will check uric acid level and if it is normal on allopurinol, than likely not gout since still with the same joint pains, and will d/c allopurinol. If uric acid is still elevated, cons increasing allopurinol to see if that will help sxs. 8. Apnea and daytime fatigue - will treat chronic sinusitis and sinus congestion first as likely the cause, if continues after treatment of turbinate swelling, then refer for sleep study Meds ordered this encounter  Medications  . predniSONE (DELTASONE) 20 MG tablet    Sig: Take 3 tabs qd x 3d, 2 tabs qd x 3d, 1 tab qd x 3d    Dispense:  18 tablet    Refill:  0  . DISCONTD: HYDROcodone-acetaminophen (NORCO) 10-325 MG per tablet    Sig: Take 1 tablet by mouth every 6 (six) hours as needed for pain.    Dispense:  60 tablet    Refill:  1 - gave pt a rx for this but pharmacy called and stated that he had just filled this from his surgeon so they cancelled  the rx  . losartan (COZAAR) 25 MG tablet    Sig: Take 1 tablet (25 mg total) by mouth every evening.    Dispense:  90 tablet    Refill:  3  . meloxicam (MOBIC) 15 MG tablet    Sig: Take 1 tablet (15 mg total) by mouth daily.    Dispense:  30 tablet    Refill:  1  . fluticasone (FLONASE) 50 MCG/ACT nasal spray    Sig: Place 2 sprays into the  nose at bedtime.    Dispense:  16 g    Refill:  6

## 2012-12-05 ENCOUNTER — Telehealth: Payer: Self-pay

## 2012-12-05 MED ORDER — EZETIMIBE 10 MG PO TABS: 10.0000 mg | ORAL_TABLET | Freq: Every day | ORAL | Status: DC

## 2012-12-05 MED ORDER — LOSARTAN POTASSIUM-HCTZ 100-25 MG PO TABS: 1.0000 | ORAL_TABLET | Freq: Every day | ORAL | Status: DC

## 2012-12-05 NOTE — Telephone Encounter (Signed)
Thank you, i did want pt to continue whatever he was on as bp was well controlled. i guess it was entered wrong

## 2012-12-05 NOTE — Telephone Encounter (Signed)
Pharmacist called to clarify Dr Alver Fisher intentions for losartan Rx. The new Rx he received is for losartan 25 mg, and the patient has been taking losartan/HCTZ 100-25 mg for some time. Pt reports that he was under the impression from OV that Dr Clelia Croft thought his BP was well controlled at current dose and was going to continue. I checked OV notes and Dr Clelia Croft wrote HTN well controlled, cont Losartan, therefore I gave pharmacist approval to correct the new Rx to continue pt's current therapy. I will change in EPIC.  Dr Clelia Croft, I'm forwarding this to you FYI.

## 2012-12-06 LAB — PSA: PSA: 0.66 ng/mL (ref <=4.00)

## 2012-12-10 ENCOUNTER — Ambulatory Visit (INDEPENDENT_AMBULATORY_CARE_PROVIDER_SITE_OTHER): Payer: 59 | Admitting: Family Medicine

## 2012-12-10 VITALS — BP 118/78 | HR 63 | Temp 98.2°F | Resp 16 | Ht 67.0 in | Wt 239.0 lb

## 2012-12-10 DIAGNOSIS — B351 Tinea unguium: Secondary | ICD-10-CM

## 2012-12-10 DIAGNOSIS — L600 Ingrowing nail: Secondary | ICD-10-CM

## 2012-12-10 DIAGNOSIS — M79676 Pain in unspecified toe(s): Secondary | ICD-10-CM

## 2012-12-10 MED ORDER — HYDROCODONE-ACETAMINOPHEN 10-500 MG PO TABS: 1.0000 | ORAL_TABLET | Freq: Three times a day (TID) | ORAL | Status: DC | PRN

## 2012-12-10 NOTE — Progress Notes (Signed)
[facility]:  Office Visit  Chief Complaint:  Chief Complaint  Patient presents with  . Ingrown Toenail    right second toe    HPI: Troy Obrien is a 62 y.o. male who complains of  Right 2nd toe ingrown toe nail , intermittent pain for last 3 years. He has been feelign this current episode for the last 3 weeks. Constant dullpain. Sharp pain when he walks on it. Tried clipping the nail and gets relief for 3-4 weeks then recurrs.   Past Medical History  Diagnosis Date  . Headache   . Hypertension   . Hyperlipidemia   . Arthritis   . Inguinal hernia   . Gout   . Glaucoma    Past Surgical History  Procedure Laterality Date  . Circumcision  1972  . Tonsillectomy  1963  . Carpel tunnel      bilateral  . Inguinal hernia repair  10/11/2012    Procedure: HERNIA REPAIR INGUINAL ADULT;  Surgeon: Robyne Askew, MD;  Location: [facility] OR;  Service: General;  Laterality: Right;  . Insertion of mesh  10/11/2012    Procedure: INSERTION OF MESH;  Surgeon: Robyne Askew, MD;  Location: [facility] OR;  Service: General;  Laterality: Right;   History   Social History  . Marital Status: Single    Spouse Name: N/A    Number of Children: N/A  . Years of Education: N/A   Social History Main Topics  . Smoking status: Never Smoker   . Smokeless tobacco: Never Used  . Alcohol Use: No  . Drug Use: No  . Sexually Active: Yes    Birth Control/ Protection: None   Other Topics Concern  . Not on file   Social History Narrative  . No narrative on file   No family history on file. Allergies  Allergen Reactions  . Laxative (Bisacodyl) Hives   Prior to Admission medications   Medication Sig Start Date End Date Taking? Authorizing Provider  brimonidine-timolol (COMBIGAN) 0.2-0.5 % ophthalmic solution Place 1 drop into both eyes at bedtime.    Yes Historical Provider, MD  ezetimibe (ZETIA) 10 MG tablet Take 1 tablet (10 mg total) by mouth daily. 12/05/12 12/05/13 Yes Sherren Mocha, MD   fluticasone (FLONASE) 50 MCG/ACT nasal spray Place 2 sprays into the nose at bedtime. 12/04/12  Yes Sherren Mocha, MD  losartan-hydrochlorothiazide (HYZAAR) 100-25 MG per tablet Take 1 tablet by mouth daily. 12/05/12  Yes Sherren Mocha, MD  colchicine 0.6 MG tablet Take 1 tablet (0.6 mg total) by mouth daily. 09/21/12   Peyton Najjar, MD  latanoprost (XALATAN) 0.005 % ophthalmic solution Place 1 drop into both eyes at bedtime.     Historical Provider, MD  losartan (COZAAR) 25 MG tablet Take 1 tablet (25 mg total) by mouth every evening. 12/04/12   Sherren Mocha, MD  meloxicam (MOBIC) 15 MG tablet Take 1 tablet (15 mg total) by mouth daily. 12/04/12   Sherren Mocha, MD  predniSONE (DELTASONE) 20 MG tablet Take 3 tabs qd x 3d, 2 tabs qd x 3d, 1 tab qd x 3d 12/04/12   Sherren Mocha, MD     ROS: The patient denies fevers, chills, night sweats, unintentional weight loss, chest pain, palpitations, wheezing, dyspnea on exertion, nausea, vomiting, abdominal pain, dysuria, hematuria, melena, numbness, weakness, or tingling.   All other systems have been reviewed and were otherwise negative with the exception of those mentioned  in the HPI and as above.    PHYSICAL EXAM: Filed Vitals:   12/10/12 1150  BP: 118/78  Pulse: 63  Temp: 98.2 F (36.8 C)  Resp: 16   Filed Vitals:   12/10/12 1150  Height: 5\' 7"  (1.702 m)  Weight: 239 lb (108.41 kg)   Body mass index is 37.42 kg/(m^2).  General: Alert, no acute distress HEENT:  Normocephalic, atraumatic, oropharynx patent. EOMI Cardiovascular:  Regular rate and rhythm, no rubs murmurs or gallops.  No Carotid bruits, radial pulse intact. No pedal edema.  Respiratory: Clear to auscultation bilaterally.  No wheezes, rales, or rhonchi.  No cyanosis, no use of accessory musculature GI: No organomegaly, abdomen is soft and non-tender, positive bowel sounds.  No masses. Skin: No rashes. Neurologic: Facial musculature symmetric. Psychiatric: Patient is appropriate throughout our  interaction. Lymphatic: No cervical lymphadenopathy Musculoskeletal: Gait intact. + right foot 2nd toe + erythematous lateral edges, ingrown toe, no e/o infection. Full ROM, 5/5 strength, + DP   LABS: Results for orders placed in visit on 12/04/12  COMPREHENSIVE METABOLIC PANEL      Result Value Range   Sodium 138  135 - 145 mEq/L   Potassium 4.8  3.5 - 5.3 mEq/L   Chloride 104  96 - 112 mEq/L   CO2 28  19 - 32 mEq/L   Glucose, Bld 108 (*) 70 - 99 mg/dL   BUN 18  6 - 23 mg/dL   Creat 4.54  0.98 - 1.19 mg/dL   Total Bilirubin 0.4  0.3 - 1.2 mg/dL   Alkaline Phosphatase 45  39 - 117 U/L   AST 17  0 - 37 U/L   ALT 22  0 - 53 U/L   Total Protein 8.1  6.0 - 8.3 g/dL   Albumin 4.6  3.5 - 5.2 g/dL   Calcium 9.9  8.4 - 14.7 mg/dL  LIPID PANEL      Result Value Range   Cholesterol 207 (*) 0 - 200 mg/dL   Triglycerides 829 (*) <150 mg/dL   HDL 55  >56 mg/dL   Total CHOL/HDL Ratio 3.8     VLDL 30  0 - 40 mg/dL   LDL Cholesterol 213 (*) 0 - 99 mg/dL  TSH      Result Value Range   TSH 0.544  0.350 - 4.500 uIU/mL  URIC ACID      Result Value Range   Uric Acid, Serum 5.7  4.0 - 7.8 mg/dL  POCT CBC      Result Value Range   WBC 5.0  4.6 - 10.2 K/uL   Lymph, poc 1.7  0.6 - 3.4   POC LYMPH PERCENT 34.8  10 - 50 %L   MID (cbc) 0.5  0 - 0.9   POC MID % 9.1  0 - 12 %M   POC Granulocyte 2.8  2 - 6.9   Granulocyte percent 56.1  37 - 80 %G   RBC 4.74  4.69 - 6.13 M/uL   Hemoglobin 12.9 (*) 14.1 - 18.1 g/dL   HCT, POC 08.6 (*) 57.8 - 53.7 %   MCV 84.7  80 - 97 fL   MCH, POC 27.2  27 - 31.2 pg   MCHC 32.2  31.8 - 35.4 g/dL   RDW, POC 46.9     Platelet Count, POC 290  142 - 424 K/uL   MPV 9.4  0 - 99.8 fL  POCT SEDIMENTATION RATE      Result Value Range  POCT SED RATE 18  0 - 22 mm/hr  POCT UA - MICROSCOPIC ONLY      Result Value Range   WBC, Ur, HPF, POC 0-1     RBC, urine, microscopic 1-3     Bacteria, U Microscopic trace     Mucus, UA neg     Epithelial cells, urine per  micros 1-2     Crystals, Ur, HPF, POC neg     Casts, Ur, LPF, POC neg     Yeast, UA neg    POCT URINALYSIS DIPSTICK      Result Value Range   Color, UA yellow     Clarity, UA clear     Glucose, UA neg     Bilirubin, UA neg     Ketones, UA neg     Spec Grav, UA 1.020     Blood, UA trace     pH, UA 6.5     Protein, UA neg     Urobilinogen, UA 0.2     Nitrite, UA neg     Leukocytes, UA Negative    IFOBT (OCCULT BLOOD)      Result Value Range   IFOBT Negative       EKG/XRAY:   Primary read interpreted by Dr. Conley Rolls at [facility].   ASSESSMENT/PLAN: Encounter Diagnoses  Name Primary?  . Ingrown toenail without infection Yes  . Onychomycosis    Rx Hydrocodone 10/325 mg #30. No refills.  Removal of toe nail completed. Monitor for s/sx of infection Wound care as directed F/u as directed    LE, THAO PHUONG, DO 12/10/2012 12:49 PM

## 2012-12-10 NOTE — Progress Notes (Signed)
Verbal consent obtained. Digital block with 5 cc 2% lidocaine plain.  SP&D.  Entire nail lifted and removed en toto.  Xeroform placed.  Cleansed and dressed.

## 2012-12-10 NOTE — Patient Instructions (Signed)

## 2012-12-19 ENCOUNTER — Telehealth: Payer: Self-pay

## 2012-12-19 ENCOUNTER — Ambulatory Visit (INDEPENDENT_AMBULATORY_CARE_PROVIDER_SITE_OTHER): Payer: 59 | Admitting: Emergency Medicine

## 2012-12-19 VITALS — BP 136/75 | HR 52 | Temp 98.1°F | Resp 16 | Ht 67.0 in | Wt 239.0 lb

## 2012-12-19 DIAGNOSIS — L0390 Cellulitis, unspecified: Secondary | ICD-10-CM

## 2012-12-19 MED ORDER — SULFAMETHOXAZOLE-TRIMETHOPRIM 800-160 MG PO TABS: 1.0000 | ORAL_TABLET | Freq: Two times a day (BID) | ORAL | Status: DC

## 2012-12-19 MED ORDER — HYDROCODONE-ACETAMINOPHEN 5-325 MG PO TABS: 1.0000 | ORAL_TABLET | Freq: Four times a day (QID) | ORAL | Status: DC | PRN

## 2012-12-19 NOTE — Telephone Encounter (Signed)
Called patient, if he has pus coming from his toe, he must return to clinic. I have advised him this is not normal and he needs to come in for this today. He is going to come in this afternoon

## 2012-12-19 NOTE — Progress Notes (Signed)
[facility] [address], [place] [address] [phone]  Date:  12/19/2012   Name:  Troy Obrien   DOB:  1951-02-25   MRN:  [id number]  PCP:  Pcp Not In System    Chief Complaint: Follow-up   History of Present Illness:  Troy Obrien is a 62 y.o. very pleasant male patient who presents with the following:  Post operative check following removal of ingrown nail.  Now has some persistent pain and some minimal drainage on the bandaid.  Has pain and tenderness in distal toe.  No fever or chills.  Patient Active Problem List  Diagnosis  . Hypertension  . Glaucoma  . Inguinal hernia, right  . Right upper quadrant pain  . Back pain  . Arthritis of hand  . Fatigue  . Sleep apnea  . Hernia    Past Medical History  Diagnosis Date  . Headache   . Hypertension   . Hyperlipidemia   . Arthritis   . Inguinal hernia   . Gout   . Glaucoma     Past Surgical History  Procedure Laterality Date  . Circumcision  1972  . Tonsillectomy  1963  . Carpel tunnel      bilateral  . Inguinal hernia repair  10/11/2012    Procedure: HERNIA REPAIR INGUINAL ADULT;  Surgeon: Robyne Askew, MD;  Location: [facility] OR;  Service: General;  Laterality: Right;  . Insertion of mesh  10/11/2012    Procedure: INSERTION OF MESH;  Surgeon: Robyne Askew, MD;  Location: [facility] OR;  Service: General;  Laterality: Right;    History  Substance Use Topics  . Smoking status: Never Smoker   . Smokeless tobacco: Never Used  . Alcohol Use: No    No family history on file.  Allergies  Allergen Reactions  . Laxative (Bisacodyl) Hives    Medication list has been reviewed and updated.  Current Outpatient Prescriptions on File Prior to Visit  Medication Sig Dispense Refill  . brimonidine-timolol (COMBIGAN) 0.2-0.5 % ophthalmic solution Place 1 drop into both eyes at bedtime.       . colchicine 0.6 MG tablet Take 1 tablet (0.6 mg total) by mouth daily.  15 tablet  1  . ezetimibe  (ZETIA) 10 MG tablet Take 1 tablet (10 mg total) by mouth daily.  30 tablet  3  . fluticasone (FLONASE) 50 MCG/ACT nasal spray Place 2 sprays into the nose at bedtime.  16 g  6  . HYDROcodone-acetaminophen (LORTAB 10) 10-500 MG per tablet Take 1 tablet by mouth every 8 (eight) hours as needed for pain.  30 tablet  0  . latanoprost (XALATAN) 0.005 % ophthalmic solution Place 1 drop into both eyes at bedtime.       Marland Kitchen losartan (COZAAR) 25 MG tablet Take 1 tablet (25 mg total) by mouth every evening.  90 tablet  3  . losartan-hydrochlorothiazide (HYZAAR) 100-25 MG per tablet Take 1 tablet by mouth daily.  90 tablet  3  . meloxicam (MOBIC) 15 MG tablet Take 1 tablet (15 mg total) by mouth daily.  30 tablet  1  . predniSONE (DELTASONE) 20 MG tablet Take 3 tabs qd x 3d, 2 tabs qd x 3d, 1 tab qd x 3d  18 tablet  0   No current facility-administered medications on file prior to visit.    Review of Systems:  As per HPI, otherwise negative.   Physical Examination: Filed Vitals:  12/19/12 1858  BP: 136/75  Pulse: 52  Temp: 98.1 F (36.7 C)  Resp: 16   Filed Vitals:   12/19/12 1858  Height: 5\' 7"  (1.702 m)  Weight: 239 lb (108.41 kg)   Body mass index is 37.42 kg/(m^2). Ideal Body Weight: Weight in (lb) to have BMI = 25: 159.3   GEN: WDWN, NAD, Non-toxic, Alert & Oriented x 3 HEENT: Atraumatic, Normocephalic.  Ears and Nose: No external deformity. EXTR: No clubbing/cyanosis/edema NEURO: Normal gait.  PSYCH: Normally interactive. Conversant. Not depressed or anxious appearing.  Calm demeanor.  FOOT:  Tender and swollen terminal phalange no abscess  Assessment and Plan: Cellulitis toe Septra ds Follow up as needed  [name], MD

## 2012-12-19 NOTE — Telephone Encounter (Signed)
Pt states that the toe that he was seen for recently is still hurting, and puss is beginning to come out of the area. Pt would like a refill on the pain medication previously prescribed for this issue.    Pharmacy: [facility]   Best# [phone]

## 2012-12-28 NOTE — Telephone Encounter (Signed)
error 

## 2013-02-09 ENCOUNTER — Other Ambulatory Visit: Payer: Self-pay | Admitting: Family Medicine

## 2013-02-19 ENCOUNTER — Other Ambulatory Visit: Payer: Self-pay | Admitting: Family Medicine

## 2013-02-20 ENCOUNTER — Encounter (INDEPENDENT_AMBULATORY_CARE_PROVIDER_SITE_OTHER): Payer: Self-pay | Admitting: General Surgery

## 2013-02-20 ENCOUNTER — Ambulatory Visit (INDEPENDENT_AMBULATORY_CARE_PROVIDER_SITE_OTHER): Payer: 59 | Admitting: General Surgery

## 2013-02-20 VITALS — BP 142/82 | HR 76 | Temp 97.1°F | Resp 16 | Ht 67.0 in | Wt 237.0 lb

## 2013-02-20 DIAGNOSIS — R1031 Right lower quadrant pain: Secondary | ICD-10-CM

## 2013-02-20 HISTORY — DX: Right lower quadrant pain: R10.31

## 2013-02-20 NOTE — Patient Instructions (Signed)
Rest, ice , ibuprofen.

## 2013-02-21 NOTE — Progress Notes (Signed)
Subjective:     Patient ID: Troy Obrien, male   DOB: Feb 15, 1951, 62 y.o.   MRN: [id number]  HPI The patient is a 62 year old black male who is about 4 months status post right inguinal hernia repair with mesh. He did well after the surgery and almost all of his pain resolved. He was left with some minor residual soreness at the area. Over the last couple weeks the soreness has gotten worse. He does not recall lifting anything heavy or feeling anything tear or pop. He denies any nausea or vomiting. He denies any fevers or chills. His appetite is good and his bowels are working normally.  Review of Systems  Constitutional: Negative.   HENT: Negative.   Eyes: Negative.   Respiratory: Negative.   Cardiovascular: Negative.   Gastrointestinal: Negative.   Endocrine: Negative.   Genitourinary: Negative.   Musculoskeletal: Negative.   Skin: Negative.   Allergic/Immunologic: Negative.   Neurological: Negative.   Hematological: Negative.   Psychiatric/Behavioral: Negative.        Objective:   Physical Exam  Constitutional: He is oriented to person, place, and time. He appears well-developed and well-nourished.  HENT:  Head: Normocephalic and atraumatic.  Eyes: Conjunctivae and EOM are normal. Pupils are equal, round, and reactive to light.  Neck: Normal range of motion. Neck supple.  Cardiovascular: Normal rate, regular rhythm and normal heart sounds.   Pulmonary/Chest: Effort normal and breath sounds normal.  Abdominal: Soft. Bowel sounds are normal.  Genitourinary:  His right groin incision has healed nicely with no sign of infection or seroma. There is no palpable impulse with straining or bulge in the right groin. He does have some point tenderness near his pubic tubercle.  Musculoskeletal: Normal range of motion.  Neurological: He is alert and oriented to person, place, and time.  Skin: Skin is warm and dry.  Psychiatric: He has a normal mood and affect. His behavior is normal.        Assessment:     The patient has worsening pain in his right groin four months after right inguinal hernia repair with mesh. At this point clinically I do not appreciate a recurrence of the hernia. I have offered to get a CT scan of the pelvis to make sure that the hernia repair looks intact but he would like to wait for now.    Plan:     I have recommended rest and ice to the area. I've recommended anti-inflammatories like ibuprofen. Hopefully his soreness will improve with time. We will plan to see him back in a month to check his progress.

## 2013-03-27 ENCOUNTER — Encounter (INDEPENDENT_AMBULATORY_CARE_PROVIDER_SITE_OTHER): Payer: Self-pay | Admitting: General Surgery

## 2013-03-27 ENCOUNTER — Other Ambulatory Visit (INDEPENDENT_AMBULATORY_CARE_PROVIDER_SITE_OTHER): Payer: Self-pay | Admitting: General Surgery

## 2013-03-27 ENCOUNTER — Ambulatory Visit (INDEPENDENT_AMBULATORY_CARE_PROVIDER_SITE_OTHER): Payer: 59 | Admitting: General Surgery

## 2013-03-27 VITALS — BP 120/88 | HR 46 | Temp 96.8°F | Resp 18 | Ht 67.5 in | Wt 244.2 lb

## 2013-03-27 DIAGNOSIS — K4090 Unilateral inguinal hernia, without obstruction or gangrene, not specified as recurrent: Secondary | ICD-10-CM

## 2013-03-27 NOTE — Progress Notes (Signed)
Subjective:     Patient ID: Troy Obrien, male   DOB: 10/13/1951, 62 y.o.   MRN: [id number]  HPI The patient is a 62 year old white male who is 5 months status post right inguinal hernia repair with mesh. The soreness he is having near his pubic bone a slowly improving. He states it hurts him most after vigorous sex.  Review of Systems     Objective:   Physical Exam On exam his right groin incision has healed nicely with no sign of infection. There is no palpable evidence for recurrence of the hernia. He is still tender to palpation near the pubic bone.    Assessment:     The patient is 5 months status post right inguinal hernia repair with mesh with some residual soreness     Plan:     At this point I think he can return to his normal activities. I will give him one more prescription for some pain medicine and then no more after that. We will plan to see him back on a when necessary basis.

## 2013-03-27 NOTE — Patient Instructions (Signed)
May return to normal activities 

## 2013-04-14 ENCOUNTER — Other Ambulatory Visit: Payer: Self-pay | Admitting: Physician Assistant

## 2013-04-26 ENCOUNTER — Other Ambulatory Visit: Payer: Self-pay | Admitting: Family Medicine

## 2013-04-26 ENCOUNTER — Telehealth (INDEPENDENT_AMBULATORY_CARE_PROVIDER_SITE_OTHER): Payer: Self-pay

## 2013-04-26 NOTE — Telephone Encounter (Signed)
I called patient back and told him Dr Carolynne Edouard will not prescribe anymore pain medicine.

## 2013-06-11 ENCOUNTER — Other Ambulatory Visit: Payer: Self-pay | Admitting: Physician Assistant

## 2013-07-05 ENCOUNTER — Telehealth (INDEPENDENT_AMBULATORY_CARE_PROVIDER_SITE_OTHER): Payer: Self-pay | Admitting: General Surgery

## 2013-07-05 NOTE — Telephone Encounter (Signed)
Pt called and demanded to see Dr. Carolynne Edouard "right now."  Pt calmed and was able to verbalize his problem for triage help.  He is having new pain at the same site as he [facility] repair with mesh, performed 9 months ago.  He denies bulging or tearing, describing the pain as "like a fist balled up and pressed into his abdomen."  Offered pt appt on Monday afternoon for evaluation in the clinic, and if he thinks the pain warrants a visit to the ER, they may proceed with ultrasound or CT to evaluate cause.  Reminded him there could be other reasons for the pain besides the hernia.  He reluctantly agreed to appt next week and will consider ER visit if pain worsens.

## 2013-07-08 ENCOUNTER — Encounter (INDEPENDENT_AMBULATORY_CARE_PROVIDER_SITE_OTHER): Payer: Self-pay | Admitting: General Surgery

## 2013-07-08 ENCOUNTER — Ambulatory Visit (INDEPENDENT_AMBULATORY_CARE_PROVIDER_SITE_OTHER): Payer: 59 | Admitting: General Surgery

## 2013-07-08 VITALS — BP 126/78 | HR 57 | Temp 96.8°F | Ht 67.5 in | Wt 237.6 lb

## 2013-07-08 DIAGNOSIS — R1031 Right lower quadrant pain: Secondary | ICD-10-CM

## 2013-07-08 NOTE — Patient Instructions (Signed)
Will get CT of pelvis when you get back from honeymoon

## 2013-07-09 ENCOUNTER — Ambulatory Visit (INDEPENDENT_AMBULATORY_CARE_PROVIDER_SITE_OTHER): Payer: 59 | Admitting: Family Medicine

## 2013-07-09 VITALS — BP 142/72 | HR 50 | Temp 98.3°F | Resp 20 | Ht 68.0 in | Wt 238.0 lb

## 2013-07-09 DIAGNOSIS — R51 Headache: Secondary | ICD-10-CM

## 2013-07-09 DIAGNOSIS — K029 Dental caries, unspecified: Secondary | ICD-10-CM

## 2013-07-09 DIAGNOSIS — K047 Periapical abscess without sinus: Secondary | ICD-10-CM

## 2013-07-09 MED ORDER — OXYCODONE-ACETAMINOPHEN 10-325 MG PO TABS: 1.0000 | ORAL_TABLET | ORAL | Status: DC | PRN

## 2013-07-09 MED ORDER — AMOXICILLIN 875 MG PO TABS: 875.0000 mg | ORAL_TABLET | Freq: Three times a day (TID) | ORAL | Status: DC

## 2013-07-09 NOTE — Progress Notes (Signed)
Subjective: A month ago the patient had a right upper molar pulled. The one adjacent to it was of some concern at that time. He's had some pain on off since then. The last 2 weeks she's had a bad taste in his mouth. The last few days she's had exquisite pain getting steadily worse and worse. It's causing a headache now. He called his dentist office and can't get an appointment for couple of more weeks.  Objective:  The cavity where the previous tooth was pulled looks inflamed still. The back upper molar is very tender. There is a little erythema. I cannot see pus draining. He has moderate right lymphadenopathy at the angle of the jaw. He is alert and oriented but moaning with pain.  Assessment: Dental abscess Dental pain  Plan: Percocet 10/325 #25 Amoxil 875 3 times a day for 10 day Go to the dentist

## 2013-07-09 NOTE — Patient Instructions (Signed)
Take Percocet 10 one half to one pill every 4 hours if needed for severe pain  Take amoxicillin 875 one 3 times daily for infection  Followup with dentist

## 2013-07-09 NOTE — Progress Notes (Signed)
Subjective:     Patient ID: Troy Obrien, male   DOB: 1951/05/05, 62 y.o.   MRN: [id number]  HPI The patient is a 62 year old black male who is 9 months status post right inguinal hernia repair with mesh. He continues to have intermittent shortness in his right groin area. This does not occur every day. 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 He denies any nausea or vomiting. He is also getting married in about 10 days and getting ready to go on his honeymoon.  Review of Systems  Constitutional: Negative.   HENT: Negative.   Eyes: Negative.   Respiratory: Negative.   Cardiovascular: Negative.   Gastrointestinal: Negative.   Endocrine: Negative.   Genitourinary: Negative.   Musculoskeletal: Negative.   Skin: Negative.   Allergic/Immunologic: Negative.   Neurological: Negative.   Hematological: Negative.   Psychiatric/Behavioral: Negative.        Objective:   Physical Exam  Constitutional: He is oriented to person, place, and time. He appears well-developed and well-nourished.  HENT:  Head: Normocephalic and atraumatic.  Eyes: Conjunctivae and EOM are normal. Pupils are equal, round, and reactive to light.  Neck: Normal range of motion. Neck supple.  Cardiovascular: Normal rate, regular rhythm and normal heart sounds.   Pulmonary/Chest: Effort normal and breath sounds normal.  Abdominal: Soft. Bowel sounds are normal.  Genitourinary:  There is some tenderness to palpation in the right groin. There is no obvious bulging or thickening of the cord. His incision has healed nicely.  Musculoskeletal: Normal range of motion.  Neurological: He is alert and oriented to person, place, and time.  Skin: Skin is warm and dry.  Psychiatric: He has a normal mood and affect. His behavior is normal.       Assessment:     The patient is 9 months status post right inguinal hernia repair with mesh with intermittent tenderness in the  right groin area. Clinically I do not feel any recurrence of his hernia. His pain could be coming from some of the heavy lifting that he is doing or could be related to some scar tissue around his mesh.     Plan:     At this point I would recommend a CT of his pelvis to rule out a recurrent hernia. If this is negative then I would recommend  Referring him to a pain clinic to see if there is anything else that can be done to help his soreness. I have offered to start him on Neurontin but he does not want to do anything that might run his honeymoon. He will call us when he is ready to schedule a CT scan.

## 2013-07-12 ENCOUNTER — Other Ambulatory Visit: Payer: Self-pay | Admitting: Family Medicine

## 2013-07-31 ENCOUNTER — Telehealth (INDEPENDENT_AMBULATORY_CARE_PROVIDER_SITE_OTHER): Payer: Self-pay

## 2013-07-31 NOTE — Telephone Encounter (Signed)
Returned call, kept getting busy signal. Not sure if pain is from rainy weather. Pt been having same symptoms for awhile now. Offered Ct last office visit but patient didn't want to schedule at that time. Call back if ready to schedule.

## 2013-07-31 NOTE — Telephone Encounter (Signed)
Message copied by Brennan Bailey on Wed Jul 31, 2013  8:51 AM ------      Message from: Zacarias Pontes      Created: Mon Jul 29, 2013  4:49 PM      Contact: [phone]       Pt had hernia surgery last Dec/2013 and wants to let Dr Carolynne Edouard know that he is having pain in that area.He would like to know if Dr Carolynne Edouard thinks this is because of the rainy weather?? Yours Truly Liborio Nixon ------

## 2013-08-07 ENCOUNTER — Other Ambulatory Visit (INDEPENDENT_AMBULATORY_CARE_PROVIDER_SITE_OTHER): Payer: Self-pay

## 2013-08-07 ENCOUNTER — Telehealth (INDEPENDENT_AMBULATORY_CARE_PROVIDER_SITE_OTHER): Payer: Self-pay | Admitting: General Surgery

## 2013-08-07 DIAGNOSIS — R1031 Right lower quadrant pain: Secondary | ICD-10-CM

## 2013-08-07 NOTE — Telephone Encounter (Signed)
Pt called to ask that Dr. Carolynne Edouard be updated that he is now back from his honeymoon and available for the [CT] to be done.  Had hernia surgery  In 2013.  Also available to OV if Dr. Carolynne Edouard needs to examine him.  Finally, he is asking for refill of pain meds.  Please call his cell:  [phone].

## 2013-08-08 ENCOUNTER — Telehealth (INDEPENDENT_AMBULATORY_CARE_PROVIDER_SITE_OTHER): Payer: Self-pay | Admitting: *Deleted

## 2013-08-08 NOTE — Telephone Encounter (Signed)
Spoke with pts wife to give her pts appt info for his CT scan at [facility] on 08/12/13 at 12:00pm.  Also instructed pts wife that the contrast needs to be picked up by tomorrow afternoon.

## 2013-08-08 NOTE — Telephone Encounter (Signed)
CT order put in epic and given to coordinator to schedule. Per Dr Carolynne Edouard no more pain medicine.

## 2013-08-12 ENCOUNTER — Other Ambulatory Visit: Payer: 59

## 2013-08-12 NOTE — Telephone Encounter (Signed)
Pt has had to postpone his CT scan for three weeks in order to be able to have the money to pay for the procedure.  Would like to know if Dr. Carolynne Edouard would consider refill for his pain medication.  He was told a message would be sent to the nurse and Dr. Carolynne Edouard.

## 2013-08-12 NOTE — Telephone Encounter (Signed)
LMOM stating that Dr Carolynne Edouard replied back to the message and Dr Carolynne Edouard said no on refilling the pain medication that he would need to get this filled thru his medical doctor.

## 2013-08-13 ENCOUNTER — Telehealth (INDEPENDENT_AMBULATORY_CARE_PROVIDER_SITE_OTHER): Payer: Self-pay

## 2013-08-13 NOTE — Telephone Encounter (Signed)
LMOM> NO MORE pain medicine per Dr Carolynne Edouard. He needs to reschedule his CT appt. He is scheduled to see Dr Carolynne Edouard on 10/22. He HAS to have that CT prior to that visit. If not he needs to reschedule appt with Dr Carolynne Edouard to after his CT is complete.

## 2013-08-13 NOTE — Telephone Encounter (Signed)
Message copied by Brennan Bailey on Tue Aug 13, 2013  4:24 PM ------      Message from: Rise Paganini      Created: Tue Aug 13, 2013 12:45 PM      Regarding: Carolynne Edouard      Contact: [phone]       Patient stated that he called on yesterday for a medication refill and was told her couldn't have one. He called back again today and stated that he wanted this message passed on to the doctor. He also stated that he will need medication for the scan.Please call, I scheduled him, but he feels like its too long to wait. Thank you. ------

## 2013-08-15 IMAGING — CR DG ABDOMEN ACUTE W/ 1V CHEST
5 series · 5 of 5 positions shown · non-contrast
Comparison: Chest x-ray dated 06/11/2012

CLINICAL DATA: Abdominal pain and vomiting.

ACUTE ABDOMEN SERIES (ABDOMEN 2 VIEW & CHEST 1 VIEW)

[PA]
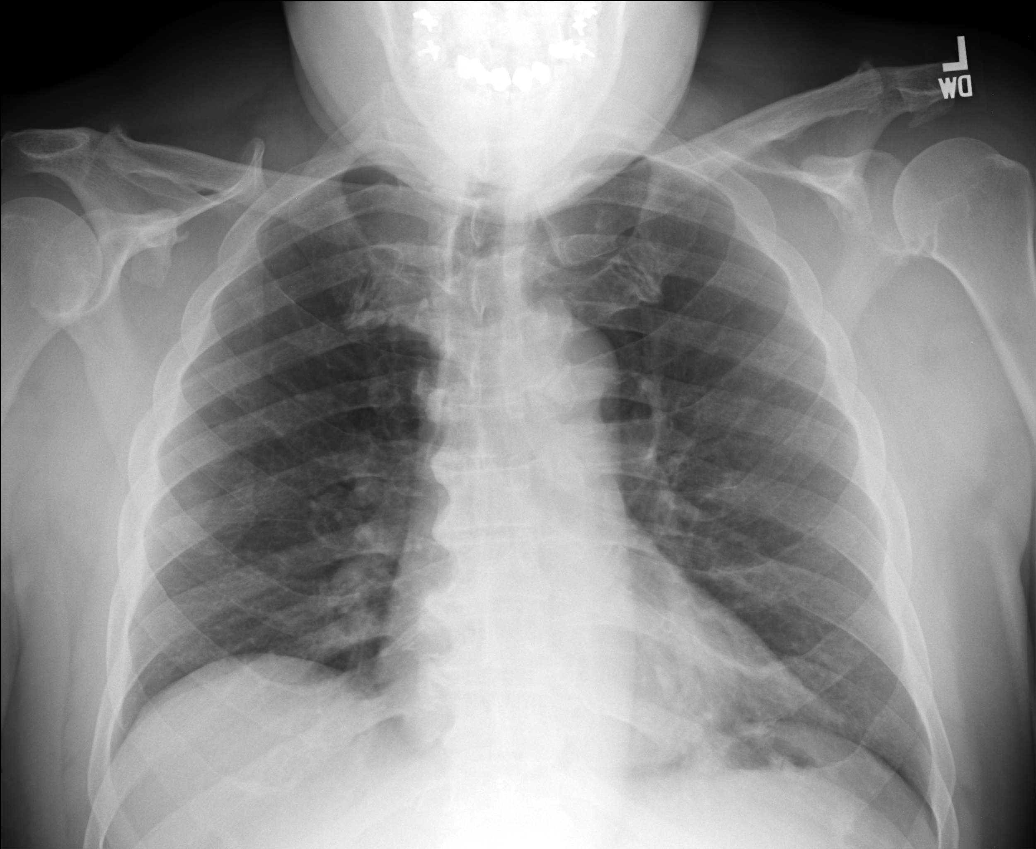

[AP (1 of 4)]
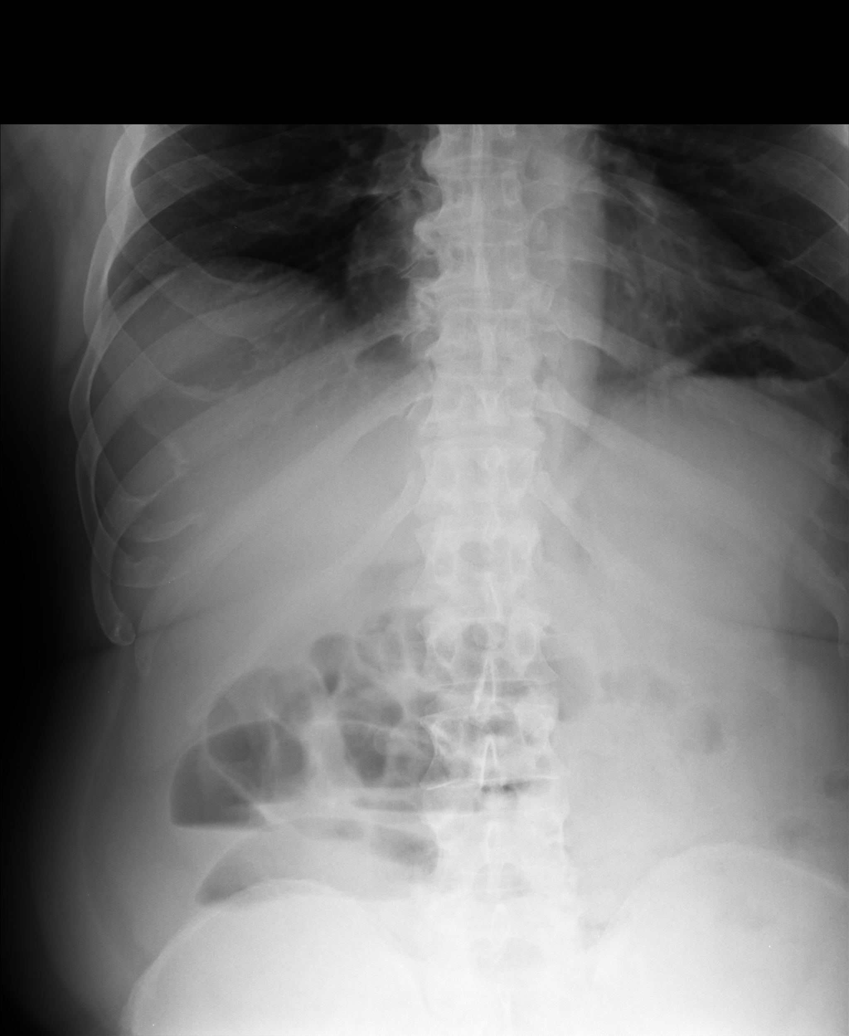

[AP (2 of 4)]
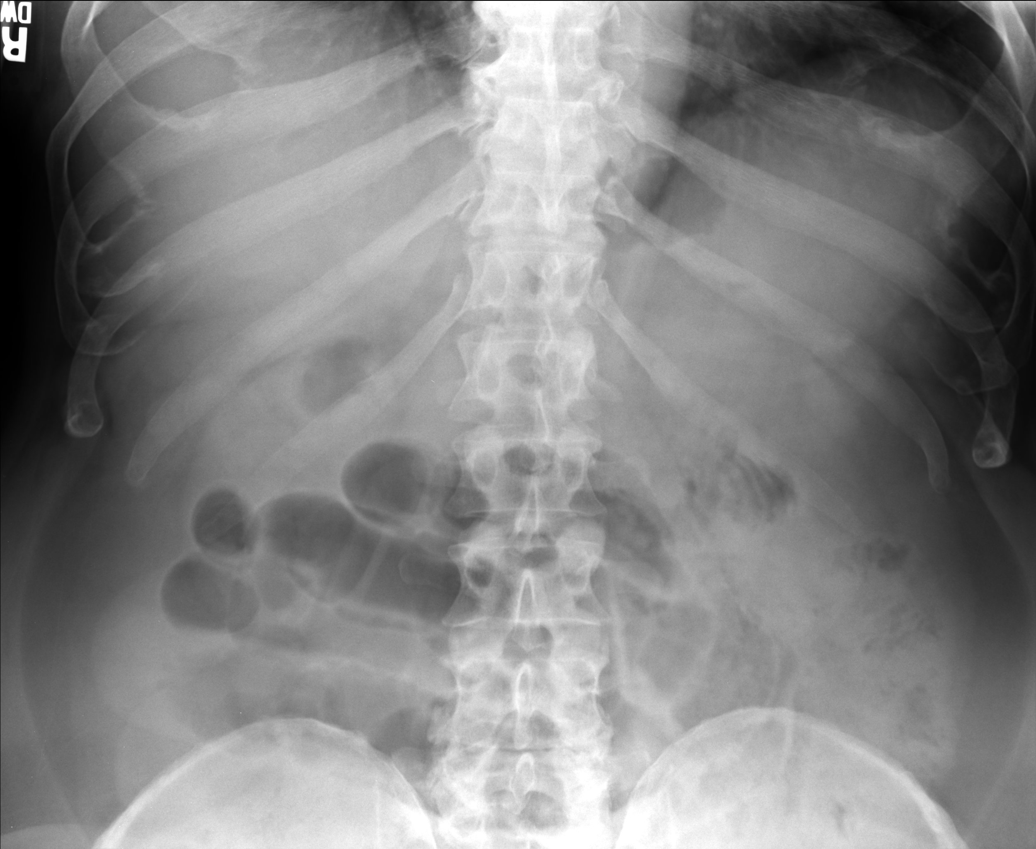

[AP (3 of 4)]
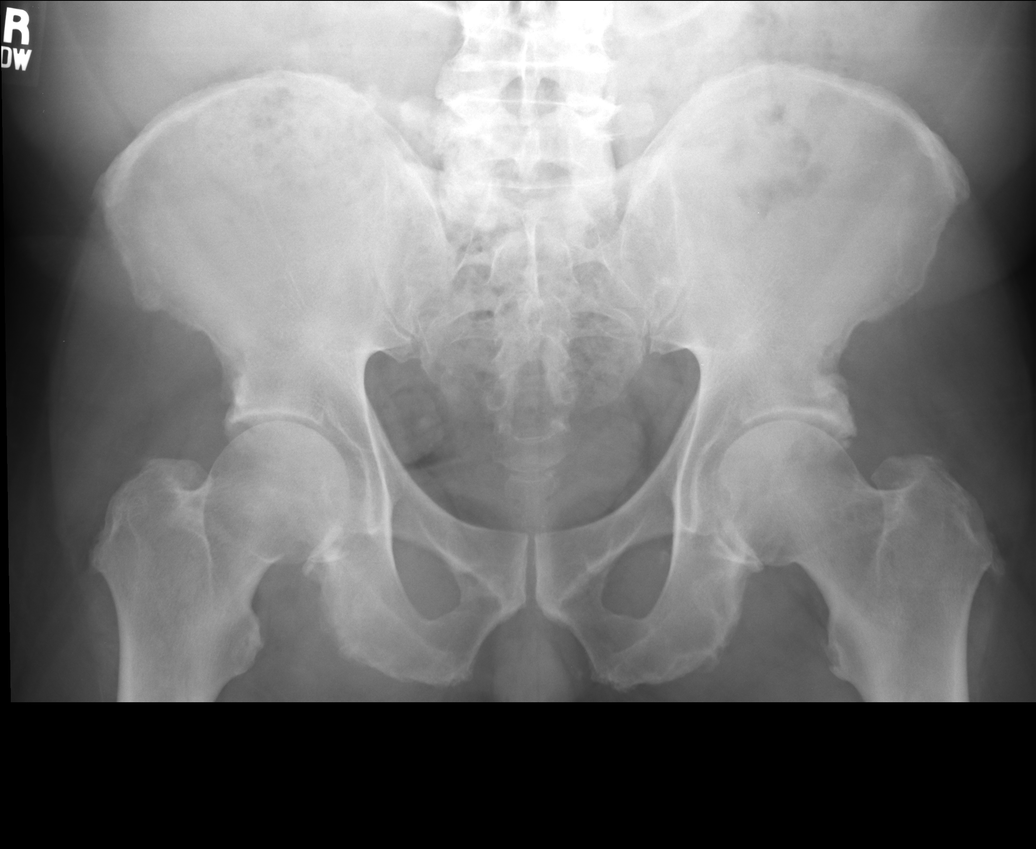

[AP (4 of 4)]
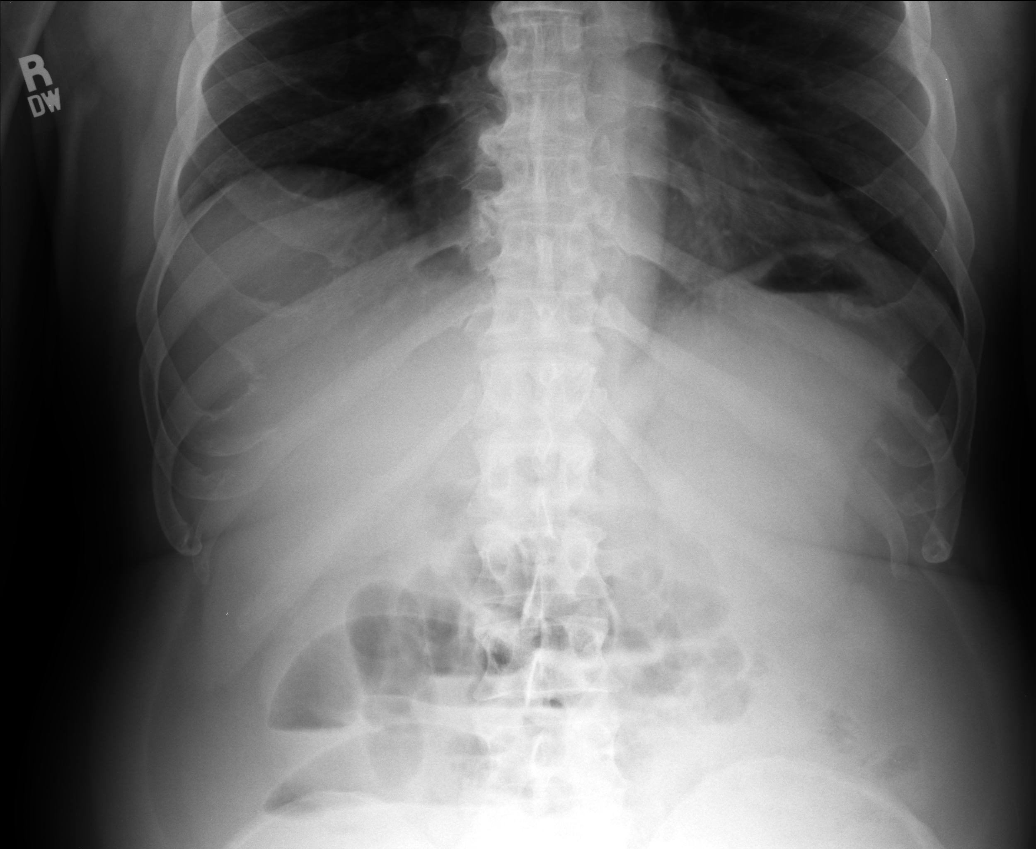

[5 of 5 positions shown; findings below may reference images not displayed]

FINDINGS: There are some dilated small bowel loops in the midline
and right lower abdomen measuring up to approximately 4.4 cm in
maximal caliber.  A relative lack of air is identified in the
colon.  There is no evidence of free air.  Findings are concerning
for developing partial small bowel obstruction.  No abnormal
calcifications are identified.
IMPRESSION: Dilated small bowel loops consistent with ileus versus partial
small bowel obstruction. No evidence of free intraperitoneal air.

These results were called by telephone on 09/29/2012 at 5708 hours
to Jim at [REDACTED], who verbally acknowledged these results.

## 2013-08-21 ENCOUNTER — Ambulatory Visit (INDEPENDENT_AMBULATORY_CARE_PROVIDER_SITE_OTHER): Payer: 59 | Admitting: General Surgery

## 2013-08-21 ENCOUNTER — Encounter (INDEPENDENT_AMBULATORY_CARE_PROVIDER_SITE_OTHER): Payer: Self-pay | Admitting: General Surgery

## 2013-08-21 VITALS — BP 127/80 | HR 77 | Temp 97.6°F | Resp 14 | Ht 67.5 in | Wt 236.2 lb

## 2013-08-21 DIAGNOSIS — K4090 Unilateral inguinal hernia, without obstruction or gangrene, not specified as recurrent: Secondary | ICD-10-CM

## 2013-08-21 NOTE — Patient Instructions (Signed)
Let us know when you schedule the CT

## 2013-08-22 ENCOUNTER — Other Ambulatory Visit: Payer: Self-pay | Admitting: Physician Assistant

## 2013-08-22 NOTE — Progress Notes (Signed)
Subjective:     Patient ID: Troy Obrien, male   DOB: 08-02-1951, 62 y.o.   MRN: [id number]  HPI The patient is a 62 year old black male who is 10 months status post right inguinal hernia repair with mesh. He continues to complain of intermittent tenderness in the right groin. He just got back from his honeymoon and states that he had a great time. He was able to do the activities that he wanted to do. At his last visit we recommended a CT scan of his pelvis to look for any complication from his previous hernia repair and so far he has not had the CT scan done. His appetite has been good and his bowels are working normally  Review of Systems     Objective:   Physical Exam On exam he continues to have some tenderness to palpation in the right groin. There is no palpable bulge. His incision has healed nicely with no sign of infection    Assessment:     The patient is 10 months status post right inguinal hernia repair with mesh     Plan:     At this point the patient wants more pain medicine but has not been willing to get the CT scan of his pelvis. I recommended that if he is going to require long-term pain medicine that he will need to establish himself With a medical doctor who can manage this long-term or we can refer him to a pain clinic. I have informed him that I have written his last prescription for pain medicines today. I will see him back after the CT scan is done to go over the results.

## 2013-08-29 ENCOUNTER — Telehealth: Payer: Self-pay

## 2013-08-29 NOTE — Telephone Encounter (Signed)
Patient has a question for amy about one of his prescriptions please call [phone]

## 2013-08-29 NOTE — Telephone Encounter (Signed)
Called him. Left message for him to call me back.  

## 2013-08-29 NOTE — Telephone Encounter (Signed)
No, but she can go online to [facility] Com have provided her this information. She will try this.

## 2013-08-29 NOTE — Telephone Encounter (Signed)
08/29/13 - AMY - PLEASE CALL MRS. BARBARA Stellmach REGARDING IF WE HAVE THE COUPON FOR ZADIA.  BEST PHONE [phone] (CELL)   MBC

## 2013-09-24 ENCOUNTER — Other Ambulatory Visit: Payer: Self-pay | Admitting: Family Medicine

## 2013-09-24 ENCOUNTER — Other Ambulatory Visit: Payer: Self-pay | Admitting: Physician Assistant

## 2013-09-24 NOTE — Telephone Encounter (Signed)
Needs ov, labs 

## 2013-10-05 ENCOUNTER — Ambulatory Visit (INDEPENDENT_AMBULATORY_CARE_PROVIDER_SITE_OTHER): Payer: 59 | Admitting: Family Medicine

## 2013-10-05 VITALS — BP 114/72 | HR 62 | Temp 98.5°F | Resp 16 | Ht 67.0 in | Wt 243.0 lb

## 2013-10-05 DIAGNOSIS — N529 Male erectile dysfunction, unspecified: Secondary | ICD-10-CM

## 2013-10-05 DIAGNOSIS — G894 Chronic pain syndrome: Secondary | ICD-10-CM

## 2013-10-05 DIAGNOSIS — R109 Unspecified abdominal pain: Secondary | ICD-10-CM

## 2013-10-05 DIAGNOSIS — M109 Gout, unspecified: Secondary | ICD-10-CM

## 2013-10-05 DIAGNOSIS — R1031 Right lower quadrant pain: Secondary | ICD-10-CM

## 2013-10-05 MED ORDER — HYDROCODONE-ACETAMINOPHEN 7.5-325 MG PO TABS: 1.0000 | ORAL_TABLET | Freq: Four times a day (QID) | ORAL | Status: DC | PRN

## 2013-10-05 MED ORDER — MELOXICAM 15 MG PO TABS: ORAL_TABLET | ORAL | Status: DC

## 2013-10-05 MED ORDER — TADALAFIL 20 MG PO TABS: ORAL_TABLET | ORAL | Status: DC

## 2013-10-05 NOTE — Progress Notes (Signed)
Subjective: Patient is here for several things. He would like a prescription for Cialis.  He is being continued to have problems with pain in his right inguinal area ever since his hernia surgery almost a year ago. He has been to the surgeon numerous times, and was told him he needs to go back to his primary care. The patient has not want to get a CT scan done, he says primarily because of the cost, that they wanted $400 upfront. He has taken hydrocodone for this he gets some relief. He's been out of it for a while. He said he was even given a prescription for oxycodone but that that did not help as much as the hydrocodone 10. He says hydrocodone 5 did not help.  He has gotten married since I saw him last time. He had been with his girlfriend for a long, believe.  His gout has flared up a little in his hand. He went out to supper last night and had shrimp.  Objective: No acute distress. Alert and oriented. He complains of a little tender area on the left occiput. There may be a tiny cyst there, is uncertain due to a little occipital prominence. If he gets worse he can come back. Abdomen soft without masses. He is tender in the lower end of the scar from his running or hernia. No hernia could be felt at this time.  Assessment: Inguinal pain status post herniorrhaphy Pain syndrome Occipital cyst Erectile dysfunction  Plan: Consider CT scan of abdomen. Prescribed him some hydrocodone 7.5 #30 tablets for prn use only. Gave him a prescription for Cialis since he had a free coupon for 3. If he keeps requiring a lot of pain medication she needs a CT scan and possible referral to a pain clinic.

## 2013-10-05 NOTE — Patient Instructions (Signed)
Use the Cialis as directed  Tried to use the hydrocodone pain pills for severe pain only.  If groin pain continues to persist we will need to get a CT scan.  Continue current medication for gout. I will communicate with you after I get the uric acid level. If it is high power probably recommend some medication for you.

## 2013-10-06 ENCOUNTER — Telehealth: Payer: Self-pay

## 2013-10-06 DIAGNOSIS — N529 Male erectile dysfunction, unspecified: Secondary | ICD-10-CM

## 2013-10-06 LAB — COMPREHENSIVE METABOLIC PANEL
ALT: 37 U/L (ref 0–53)
AST: 23 U/L (ref 0–37)
Alkaline Phosphatase: 50 U/L (ref 39–117)
BUN: 16 mg/dL (ref 6–23)
CO2: 28 mEq/L (ref 19–32)
Calcium: 9.7 mg/dL (ref 8.4–10.5)
Chloride: 102 mEq/L (ref 96–112)
Creat: 1.28 mg/dL (ref 0.50–1.35)
Glucose, Bld: 87 mg/dL (ref 70–99)
Total Bilirubin: 0.3 mg/dL (ref 0.3–1.2)

## 2013-10-06 NOTE — Telephone Encounter (Signed)
tadalafil (CIALIS) 20 MG tablet   Patient states he was only given three pills and needs 30.  Pharmacy closes in less than 30 minutes.   [facility] on [address]  [phone]

## 2013-10-07 ENCOUNTER — Encounter: Payer: Self-pay | Admitting: Radiology

## 2013-10-07 MED ORDER — TADALAFIL 20 MG PO TABS: ORAL_TABLET | ORAL | Status: DC

## 2013-10-07 NOTE — Telephone Encounter (Signed)
Call pharmacy and d/c the 3 pills and give Cialis 5 mg #30.  He had a discount card which I did not read carefully, and it is the 5 mg pills that usually are discounted for 30 pills. Give 3 refills.

## 2013-10-07 NOTE — Telephone Encounter (Signed)
Received another request from pharmacy. This is done.

## 2013-10-25 ENCOUNTER — Other Ambulatory Visit: Payer: Self-pay | Admitting: Physician Assistant

## 2013-10-28 ENCOUNTER — Ambulatory Visit: Payer: 59

## 2013-10-28 ENCOUNTER — Telehealth: Payer: Self-pay

## 2013-10-28 ENCOUNTER — Ambulatory Visit (INDEPENDENT_AMBULATORY_CARE_PROVIDER_SITE_OTHER): Payer: 59 | Admitting: Family Medicine

## 2013-10-28 VITALS — BP 124/72 | HR 76 | Temp 98.3°F | Ht 67.0 in | Wt 242.0 lb

## 2013-10-28 DIAGNOSIS — G4700 Insomnia, unspecified: Secondary | ICD-10-CM

## 2013-10-28 DIAGNOSIS — R079 Chest pain, unspecified: Secondary | ICD-10-CM

## 2013-10-28 DIAGNOSIS — D649 Anemia, unspecified: Secondary | ICD-10-CM

## 2013-10-28 DIAGNOSIS — I10 Essential (primary) hypertension: Secondary | ICD-10-CM

## 2013-10-28 DIAGNOSIS — E782 Mixed hyperlipidemia: Secondary | ICD-10-CM

## 2013-10-28 DIAGNOSIS — F4323 Adjustment disorder with mixed anxiety and depressed mood: Secondary | ICD-10-CM

## 2013-10-28 DIAGNOSIS — F4321 Adjustment disorder with depressed mood: Secondary | ICD-10-CM

## 2013-10-28 LAB — POCT CBC
Granulocyte percent: 55.8 %G (ref 37–80)
Hemoglobin: 13.5 g/dL — AB (ref 14.1–18.1)
MID (cbc): 0.7 (ref 0–0.9)
MPV: 9.8 fL (ref 0–99.8)
POC Granulocyte: 3.9 (ref 2–6.9)
POC MID %: 9.7 %M (ref 0–12)
Platelet Count, POC: 251 10*3/uL (ref 142–424)
RBC: 4.97 M/uL (ref 4.69–6.13)
RDW, POC: 15.7 %
WBC: 7 10*3/uL (ref 4.6–10.2)

## 2013-10-28 LAB — TSH: TSH: 0.934 u[IU]/mL (ref 0.350–4.500)

## 2013-10-28 LAB — COMPREHENSIVE METABOLIC PANEL
ALT: 28 U/L (ref 0–53)
Alkaline Phosphatase: 50 U/L (ref 39–117)
CO2: 24 mEq/L (ref 19–32)
Calcium: 9.8 mg/dL (ref 8.4–10.5)
Chloride: 103 mEq/L (ref 96–112)
Potassium: 4.6 mEq/L (ref 3.5–5.3)
Sodium: 137 mEq/L (ref 135–145)
Total Protein: 7.6 g/dL (ref 6.0–8.3)

## 2013-10-28 MED ORDER — HYDROCODONE-ACETAMINOPHEN 10-325 MG PO TABS: 1.0000 | ORAL_TABLET | Freq: Four times a day (QID) | ORAL | Status: DC | PRN

## 2013-10-28 MED ORDER — CLONAZEPAM 0.5 MG PO TABS: 0.5000 mg | ORAL_TABLET | Freq: Two times a day (BID) | ORAL | Status: DC | PRN

## 2013-10-28 MED ORDER — EZETIMIBE 10 MG PO TABS: 10.0000 mg | ORAL_TABLET | Freq: Every day | ORAL | Status: DC

## 2013-10-28 NOTE — Telephone Encounter (Signed)
Dr Clelia Croft. Pt's wife called to check on Rx for Shingles vaccine that was supposed to have been sent in at prev time. I don't see that it was done. Can you Rx this for him? I have pended it. Pt would like [facility] to  [facility]

## 2013-10-28 NOTE — Progress Notes (Addendum)
Subjective:  This chart was scribed for [name], MD by Carl Best, Medical Scribe. This patient was seen in Room 10 and the patient's care was started at 1:45 PM.   Patient ID: Troy Obrien, male    DOB: Mar 11, 1951, 62 y.o.   MRN: [id number] Chief Complaint  Patient presents with  . Chest Pain    x 8 days off & on    HPI HPI Comments: Troy Obrien is a 62 y.o. male who presents to the [facility] with his wife complaining of severe, intermittent, stabbing, non-radiating chest pain located just lateral to left lower sternum that started a week ago Saturday when he was attending his mother's funeral in [place], [place].  He states that the pain was so severe that he was unable to move his arms at all or alert anyone - just sitting during the funeral feeling like 200 lbs was pressing on his chest. The pain is so severe it did cause some diaphoresis and dyspnea, but no radiation, n/v.  He states that after 30 minutes of this initial episode it gradually relieved itself and since then has returned intermittently but not as severe.  He states that the last time he felt his chest pain was yesterday at church but is having it again now in the office.  He states that he can induce it when he presses on his chest.  He states that the the pain seems to come on whenever he thinks about his mother's funeral but this is almost constant this week.  His mother's passing was expected but he had still been really shaken and devastated by it - feels like he is still in denial.  He is unsure as to whether or not lifting or twisting aggravates his pain or if deep breathing aggravates or relieves the pain.  He states that he has been having hot flashes lately.  He states that he has gained weight due to poor diet.  He denies recent heartburn or indigestion though has had in the past.  His wife states he had not been sleeping this week due to his grief.  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 He has chronic hand and low back arthritis for which he takes meloxicam nightly but has also been taking a lot of ibuprofen for pain relief.     Past Medical History  Diagnosis Date  . Headache(784.0)   . Hypertension   . Hyperlipidemia   . Arthritis   . Inguinal hernia   . Gout   . Glaucoma    Past Surgical History  Procedure Laterality Date  . Circumcision  1972  . Tonsillectomy  1963  . Carpel tunnel      bilateral  . Inguinal hernia repair  10/11/2012    Procedure: HERNIA REPAIR INGUINAL ADULT;  Surgeon: Robyne Askew, MD;  Location: [facility] OR;  Service: General;  Laterality: Right;  . Insertion of mesh  10/11/2012    Procedure: INSERTION OF MESH;  Surgeon: Robyne Askew, MD;  Location: [facility] OR;  Service: General;  Laterality: Right;   No family history on file. History   Social History  . Marital Status: Single    Spouse Name: N/A    Number of Children: N/A  . Years of Education: N/A   Occupational History  . Not on file.   Social History Main Topics  . Smoking status: Never Smoker   .  Smokeless tobacco: Never Used  . Alcohol Use: 0.0 oz/week  . Drug Use: No  . Sexual Activity: Yes    Birth Control/ Protection: None   Other Topics Concern  . Not on file   Social History Narrative  . No narrative on file   Allergies  Allergen Reactions  . Laxative [Bisacodyl] Hives    Review of Systems  Constitutional: Positive for diaphoresis, fatigue and unexpected weight change (weight gain). Negative for fever, activity change and appetite change.  Cardiovascular: Positive for chest pain.  Gastrointestinal: Negative for nausea and vomiting.  Endocrine: Positive for heat intolerance.  Psychiatric/Behavioral: Positive for sleep disturbance.   BP 124/72  Pulse 76  Temp(Src) 98.3 F (36.8 C) (Oral)  Ht 5\' 7"  (1.702 m)  Wt 242 lb (109.77 kg)  BMI 37.89 kg/m2  SpO2 97% Objective:  Physical Exam  Nursing note and vitals  reviewed. Constitutional: He is oriented to person, place, and time. He appears well-developed and well-nourished. No distress.  HENT:  Head: Normocephalic and atraumatic.  Right Ear: External ear normal.  Left Ear: External ear normal.  Eyes: Conjunctivae and EOM are normal. Pupils are equal, round, and reactive to light.  Neck: Normal range of motion. Neck supple. No thyromegaly present.  Cardiovascular: Normal rate, regular rhythm and normal heart sounds.   Pulmonary/Chest: Effort normal and breath sounds normal. No respiratory distress. He has no decreased breath sounds. He has no wheezes. He has no rhonchi. He has no rales. He exhibits tenderness and bony tenderness. He exhibits no mass, no crepitus and no deformity.    Neurological: He is alert and oriented to person, place, and time.  Skin: Skin is warm and dry. No rash noted.  Psychiatric: He has a normal mood and affect. His behavior is normal.   [facility] reading (PRIMARY) by  Dr. Clelia Croft. CXR: No sig abnormality, mild cardiomegaly likely secondary to poor inspiration.  EKG: NSR, flipped T waves in lead I and aVL - no sig change from prior on 06/12/12.  Results for orders placed in visit on 10/28/13  POCT CBC      Result Value Range   WBC 7.0  4.6 - 10.2 K/uL   Lymph, poc 2.4  0.6 - 3.4   POC LYMPH PERCENT 34.5  10 - 50 %L   MID (cbc) 0.7  0 - 0.9   POC MID % 9.7  0 - 12 %M   POC Granulocyte 3.9  2 - 6.9   Granulocyte percent 55.8  37 - 80 %G   RBC 4.97  4.69 - 6.13 M/uL   Hemoglobin 13.5 (*) 14.1 - 18.1 g/dL   HCT, POC 40.9 (*) 81.1 - 53.7 %   MCV 86.9  80 - 97 fL   MCH, POC 27.2  27 - 31.2 pg   MCHC 31.3 (*) 31.8 - 35.4 g/dL   RDW, POC 91.4     Platelet Count, POC 251  142 - 424 K/uL   MPV 9.8  0 - 99.8 fL   Assessment & Plan:  Chest pain - Plan: DG Chest 2 View, EKG 12-Lead, POCT CBC, TSH, Ambulatory referral to Cardiology - I suspect pt's CP is due to his grieving his loss of his mother and the stress along w/ it.  Encouraged pt to try clonazepam but he is VERY reluctant. His wife agrees w/ me and pt finally agrees to try it at night to help him sleep as he has been having insomnia w/ the grief.  Pt specifically requests hydrocodone 10mg  for his pain - states he doesn't see why he should have to suffer and go through his days w/ this pain when he knows that will take it away - repeatedly explained the need for a "therapeutic trial" w/ clonazepam and that this is likely to relief his pain but he is adamant about pain medication.  His pain is somewhat atypical in its timing but he does have a baseline abnml EKG as well as risk factors of age, male, htn, hpl, obesity so will refer to cardiology for stress testing. To ER if CP worsens.  Mixed hyperlipidemia - Plan: POCT CBC, Comprehensive metabolic panel, TSH, Ambulatory referral to Cardiology - rec FASTING at f/u appt.  Essential hypertension, benign - Plan: Ambulatory referral to Cardiology  Anemia - unknown etiology - improved from prior.  Adjustment disorder with mixed anxiety and depressed mood  Insomnia  Grieving  Arthritis - cont meloxicam qhs.  Do not use with any other otc pain medication other than tylenol/acetaminophen - so no aleve, ibuprofen, motrin, advil, etc.  Never wants to be on another muscle relaxer ever - blames cyclobenzaprine for causing continual hand spasms after trying it once.  Meds ordered this encounter  Medications  . bimatoprost (LUMIGAN) 0.03 % ophthalmic solution    Sig: 1 drop at bedtime.  . clonazePAM (KLONOPIN) 0.5 MG tablet    Sig: Take 1 tablet (0.5 mg total) by mouth 2 (two) times daily as needed for anxiety.    Dispense:  20 tablet    Refill:  1  . HYDROcodone-acetaminophen (NORCO) 10-325 MG per tablet    Sig: Take 1 tablet by mouth every 6 (six) hours as needed.    Dispense:  30 tablet    Refill:  0  . ezetimibe (ZETIA) 10 MG tablet    Sig: Take 1 tablet (10 mg total) by mouth daily. NEED VISIT, LABS!!!     Dispense:  30 tablet    Refill:  5    I personally performed the services described in this documentation, which was scribed in my presence. The recorded information has been reviewed and considered, and addended by me as needed.  [name], MD MPH  Over 50 minutes spent in face to face consultation and evaluation of patient.

## 2013-10-28 NOTE — Telephone Encounter (Signed)
Dr Alwyn Ren, you just saw pt for check up but didn't address hyperlipidemia. Can we RF his Zetia?

## 2013-10-29 ENCOUNTER — Encounter: Payer: Self-pay | Admitting: Family Medicine

## 2013-10-29 MED ORDER — ZOSTER VACCINE LIVE 19400 UNT/0.65ML ~~LOC~~ SOLR: 0.6500 mL | Freq: Once | SUBCUTANEOUS | Status: DC

## 2013-10-29 NOTE — Telephone Encounter (Signed)
done

## 2013-10-30 ENCOUNTER — Other Ambulatory Visit: Payer: Self-pay | Admitting: Physician Assistant

## 2013-10-31 HISTORY — PX: NASAL TURBINATE REDUCTION: SHX2072

## 2013-11-08 ENCOUNTER — Telehealth: Payer: Self-pay

## 2013-11-08 NOTE — Telephone Encounter (Signed)
SHAW - Pt has an appointment set up for his heart condition on 11-19-13, but states he still needs the pain medicine you had given him for this.  Please refill as soon as possible.  I told him if he began hurting to bad to come in and be seen. [phone]

## 2013-11-09 ENCOUNTER — Telehealth: Payer: Self-pay

## 2013-11-09 MED ORDER — HYDROCODONE-ACETAMINOPHEN 10-325 MG PO TABS: 1.0000 | ORAL_TABLET | Freq: Four times a day (QID) | ORAL | Status: DC | PRN

## 2013-11-09 NOTE — Telephone Encounter (Signed)
Will refill x 1 but not a chronic medication so if he is still having chest pain will need to come back in so we can figure out where else it might be coming from and what else we can do to treat him.

## 2013-11-09 NOTE — Telephone Encounter (Signed)
Called pt, advised to RTC. He states he still needs refill on his pain medication. He will stop taking the clonazepam. Please advise

## 2013-11-09 NOTE — Telephone Encounter (Signed)
PT IS CALLING TO ADVISE DR Clelia CroftSHAW THAT THE MEDICATION SHE PRESCRIBED TO HELP HIM SLEEP DOES NOT HELP, STATES IT MAKES HIM EXTREMELY DIZZY. WANTS TO KNOW WHAT TO DO

## 2013-11-09 NOTE — Telephone Encounter (Signed)
Don't take it. If he needs something else for sleep, can come in for a visit.

## 2013-11-09 NOTE — Telephone Encounter (Signed)
See other telephone call.  Will refill x 1 but not a chronic medication so if he is still having chest pain will need to come back in so we can figure out where else it might be coming from and what else we can do to treat him.  Printed off so he can pick up on 1/11 or after

## 2013-11-10 NOTE — Telephone Encounter (Signed)
Spoke with pt advised Rx ready to pick up and he may need to RTC to discuss pain with Dr Clelia CroftShaw. I advised she would only write the Rx this one last time. Pt understood.

## 2013-11-19 ENCOUNTER — Ambulatory Visit: Payer: 59 | Admitting: Internal Medicine

## 2013-12-02 ENCOUNTER — Encounter (INDEPENDENT_AMBULATORY_CARE_PROVIDER_SITE_OTHER): Payer: Self-pay | Admitting: General Surgery

## 2013-12-02 ENCOUNTER — Ambulatory Visit (INDEPENDENT_AMBULATORY_CARE_PROVIDER_SITE_OTHER): Payer: 59 | Admitting: General Surgery

## 2013-12-02 VITALS — BP 130/81 | HR 86 | Temp 97.5°F | Resp 14 | Ht 67.5 in | Wt 247.6 lb

## 2013-12-02 DIAGNOSIS — R1031 Right lower quadrant pain: Secondary | ICD-10-CM

## 2013-12-02 NOTE — Patient Instructions (Signed)
Will write note for 25lb weight restriction at work

## 2013-12-02 NOTE — Progress Notes (Signed)
Subjective:     Patient ID: Troy Obrien, male   DOB: 1951-09-20, 63 y.o.   MRN: [id number]  HPI The patient is a 63 year old white male who is about one year status post right inguinal hernia repair with mesh. He finally seemed to have some resolution of the discomfort in his right groin. 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 One of these boxes him in the right groin and he has had some pain Ever since. He denies any nausea or vomiting. His appetite is good his bowels are working normally.  Review of Systems  Constitutional: Negative.   HENT: Negative.   Eyes: Negative.   Respiratory: Negative.   Cardiovascular: Negative.   Gastrointestinal: Negative.   Endocrine: Negative.   Genitourinary: Negative.   Musculoskeletal: Negative.   Skin: Negative.   Allergic/Immunologic: Negative.   Neurological: Negative.   Hematological: Negative.   Psychiatric/Behavioral: Negative.        Objective:   Physical Exam  Constitutional: He is oriented to person, place, and time. He appears well-developed and well-nourished.  HENT:  Head: Normocephalic and atraumatic.  Eyes: Conjunctivae and EOM are normal. Pupils are equal, round, and reactive to light.  Neck: Normal range of motion. Neck supple.  Cardiovascular: Normal rate, regular rhythm and normal heart sounds.   Pulmonary/Chest: Effort normal and breath sounds normal.  Abdominal: Soft. Bowel sounds are normal.  Genitourinary:  There is tenderness to palpation in the right groin near the pubic tubercle. There is no palpable bulge or impulse with straining.  Musculoskeletal: Normal range of motion.  Neurological: He is alert and oriented to person, place, and time.  Skin: Skin is warm and dry.  Psychiatric: He has a normal mood and affect. His behavior is normal.       Assessment:     The patient has no pain in the right groin after getting hit in the groin with a heavy box of fire exposures.      Plan:     At this point I would recommend limiting his lifting to about 25 pounds at work. I will give him one prescription for pain Medicine. Our plan to see him back in one month to check his progress. I have offered to obtain a CT scan of his pelvis to look for any evidence of recurrence of his hernia but he declines at this point.

## 2013-12-03 ENCOUNTER — Encounter (INDEPENDENT_AMBULATORY_CARE_PROVIDER_SITE_OTHER): Payer: Self-pay | Admitting: General Surgery

## 2013-12-03 ENCOUNTER — Telehealth (INDEPENDENT_AMBULATORY_CARE_PROVIDER_SITE_OTHER): Payer: Self-pay | Admitting: General Surgery

## 2013-12-03 ENCOUNTER — Other Ambulatory Visit (INDEPENDENT_AMBULATORY_CARE_PROVIDER_SITE_OTHER): Payer: Self-pay | Admitting: General Surgery

## 2013-12-03 ENCOUNTER — Telehealth (INDEPENDENT_AMBULATORY_CARE_PROVIDER_SITE_OTHER): Payer: Self-pay

## 2013-12-03 NOTE — Telephone Encounter (Signed)
Called patient to come by the office and pick up new Rx from Dr Carolynne Edouardoth . Dr Carolynne Edouardoth wrote Hydrocodone 10/325 siq 1 po q 4-6 prn for pain #60 with no refill. Patient stated that he will be by this afternoon to pick up Rx

## 2013-12-03 NOTE — Telephone Encounter (Signed)
Pt was seen yesterday by Dr. Carolynne Edouardoth and given a Rx for Percocet.  He states he thought it was Hydrocodone and that Percocet makes him sick.  He took one last night and became nauseated and vomited.  He is requesting a new Rx for Hydrocodone.  Pls call him on his cell phone.

## 2013-12-03 NOTE — Telephone Encounter (Signed)
Called patient to let him know that he has a Rx for pain med at the front desk

## 2013-12-06 ENCOUNTER — Telehealth: Payer: Self-pay

## 2013-12-06 DIAGNOSIS — N529 Male erectile dysfunction, unspecified: Secondary | ICD-10-CM

## 2013-12-06 NOTE — Telephone Encounter (Signed)
Call in cialis 20 mg #10 with 2 refills

## 2013-12-06 NOTE — Telephone Encounter (Signed)
Pharm sent fax stating pt reqs a #30 tablet Rx for Cialis. Do you want to Rx this way?

## 2013-12-08 MED ORDER — TADALAFIL 20 MG PO TABS: ORAL_TABLET | ORAL | Status: DC

## 2013-12-20 ENCOUNTER — Telehealth (INDEPENDENT_AMBULATORY_CARE_PROVIDER_SITE_OTHER): Payer: Self-pay

## 2013-12-20 NOTE — Telephone Encounter (Signed)
No more pain meds

## 2013-12-20 NOTE — Telephone Encounter (Signed)
Called and spoke to patient, made aware that Dr. Carolynne Edouardoth denied refill for pain medication.  Patient was asking to have an earlier appointment with Dr. Carolynne Edouardoth. Patient scheduled for 12/30/13 @ 4:50 pm.

## 2013-12-20 NOTE — Telephone Encounter (Signed)
Patient calling into office requesting a refill on his pain medication Hydrocodone 10/325mg .  Patient states he has upcoming appointment on 12/30/13 @ 4:50 pm w/Dr. Carolynne Edouardoth for hernia recheck.  Patient last refill rec'd during office visit on 12/03/13.  Patient aware that we will call after Dr. Carolynne Edouardoth reviews.

## 2013-12-27 ENCOUNTER — Other Ambulatory Visit: Payer: Self-pay | Admitting: Family Medicine

## 2013-12-27 NOTE — Telephone Encounter (Signed)
Dr Clelia CroftShaw, do you want to RF pt's BP meds or does the cardiologist need to write for this now?

## 2013-12-30 ENCOUNTER — Encounter (INDEPENDENT_AMBULATORY_CARE_PROVIDER_SITE_OTHER): Payer: Self-pay | Admitting: General Surgery

## 2013-12-30 ENCOUNTER — Ambulatory Visit (INDEPENDENT_AMBULATORY_CARE_PROVIDER_SITE_OTHER): Payer: 59 | Admitting: General Surgery

## 2013-12-30 VITALS — BP 142/86 | HR 66 | Temp 98.6°F | Resp 18 | Ht 67.5 in | Wt 245.6 lb

## 2013-12-30 DIAGNOSIS — R1031 Right lower quadrant pain: Secondary | ICD-10-CM

## 2013-12-30 NOTE — Patient Instructions (Signed)
He must see medical doctor for any more pain meds

## 2013-12-31 NOTE — Progress Notes (Signed)
Subjective:     Patient ID: Troy Obrien, male   DOB: [birth year], 63 y.o.   MRN: [id number]  HPI The patient is a 63 year old black male who is just over a year out from a right inguinal hernia repair with mesh. He was doing well until recently when it box at work it in the right groin. He has had some intermittent pain in the right groin since that time. Over the last month it seems to have gotten better. He only occasionally complains of any pain. His appetite is good and his bowels are working normally. His main request today is that he wants another prescription for pain medicine  Review of Systems  Constitutional: Negative.   HENT: Negative.   Eyes: Negative.   Respiratory: Negative.   Cardiovascular: Negative.   Gastrointestinal: Negative.   Endocrine: Negative.   Genitourinary: Negative.   Musculoskeletal: Negative.   Skin: Negative.   Allergic/Immunologic: Negative.   Neurological: Negative.   Hematological: Negative.   Psychiatric/Behavioral: Negative.        Objective:   Physical Exam  Constitutional: He is oriented to person, place, and time. He appears well-developed and well-nourished.  HENT:  Head: Normocephalic and atraumatic.  Eyes: Conjunctivae and EOM are normal. Pupils are equal, round, and reactive to light.  Neck: Normal range of motion. Neck supple.  Cardiovascular: Normal rate, regular rhythm and normal heart sounds.   Pulmonary/Chest: Effort normal and breath sounds normal.  Abdominal: Soft. Bowel sounds are normal.  Genitourinary:  There is no palpable bulge or impulse with straining in the right groin. He seems to be significantly less tender in the right groin to palpation  Musculoskeletal: Normal range of motion.  Neurological: He is alert and oriented to person, place, and time.  Skin: Skin is warm and dry.  Psychiatric: He has a normal mood and affect. His behavior is normal.       Assessment:     The patient has some persistent right groin  pain after some local trauma to the area at work. This seems to be resolving now.     Plan:     At this point I will give him his last prescription for hydrocodone. He understands that there will be no more prescriptions for pain medicine. If he continues to complain of pain and he may need a CT scan of the area to rule out any recurrence of his hernia. I will plan to see him back on a when necessary basis

## 2014-01-09 DIAGNOSIS — Z0271 Encounter for disability determination: Secondary | ICD-10-CM

## 2014-01-26 ENCOUNTER — Ambulatory Visit (INDEPENDENT_AMBULATORY_CARE_PROVIDER_SITE_OTHER): Payer: 59 | Admitting: Physician Assistant

## 2014-01-26 ENCOUNTER — Other Ambulatory Visit: Payer: Self-pay | Admitting: Family Medicine

## 2014-01-26 VITALS — BP 124/70 | HR 59 | Temp 97.7°F | Resp 16 | Ht 67.0 in | Wt 242.0 lb

## 2014-01-26 DIAGNOSIS — M62838 Other muscle spasm: Secondary | ICD-10-CM

## 2014-01-26 DIAGNOSIS — M549 Dorsalgia, unspecified: Secondary | ICD-10-CM

## 2014-01-26 MED ORDER — HYDROCODONE-ACETAMINOPHEN 10-325 MG PO TABS: 1.0000 | ORAL_TABLET | Freq: Three times a day (TID) | ORAL | Status: DC | PRN

## 2014-01-26 MED ORDER — MELOXICAM 15 MG PO TABS: 15.0000 mg | ORAL_TABLET | Freq: Every day | ORAL | Status: DC

## 2014-01-26 NOTE — Progress Notes (Signed)
   Subjective:    Patient ID: Troy Obrien, male    DOB: Apr 19, 1951, 63 y.o.   MRN: [id number]  HPI 63 year old male presents for evaluation of acute onset of upper back pain after lifting an amplifier last night.  States the pain is in his upper thoracic region and radiates down his back.  Denies paresthesias or weakness in his arm.  Has full ROM but does admit to associated pain.  Has taken ibuprofen 600 mg and tylenol 1000 mg which has not helped at all.  Used heat last night that didn't help.    Also has chronic pain in his right groin secondary to a hernia repair 1 year ago.  Has been seeing Dr. Carolynne Edouardoth for this who said he would no longer prescribe pain medication for him.  They also recommend a CT scan to evaluate the cause of his continued pain.  Patient reports it is intermittent in nature and only "flares" up when he lifts something heavy.    PCP is Dr. Clelia CroftShaw    Review of Systems  Gastrointestinal: Negative for nausea and vomiting.  Musculoskeletal: Positive for arthralgias.  Skin: Negative for wound.  Neurological: Negative for weakness and numbness.       Objective:   Physical Exam  Constitutional: He is oriented to person, place, and time. He appears well-developed and well-nourished.  HENT:  Head: Normocephalic and atraumatic.  Right Ear: External ear normal.  Left Ear: External ear normal.  Eyes: Conjunctivae are normal.  Neck: Normal range of motion.  Cardiovascular: Normal rate.   Pulmonary/Chest: Effort normal.  Musculoskeletal:       Thoracic back: He exhibits spasm (left sided paraspinals and trapezius). He exhibits no bony tenderness, no swelling and no deformity.  Neurological: He is alert and oriented to person, place, and time.  Psychiatric: He has a normal mood and affect. His behavior is normal. Judgment and thought content normal.          Assessment & Plan:  Spasm of muscle - Plan: meloxicam (MOBIC) 15 MG tablet  Pain in back - Plan:  HYDROcodone-acetaminophen (NORCO) 10-325 MG per tablet  Patient here with acute episode of thoracic back pain s/p an lifting injury last night. Patient is adamant that he will not take muscle relaxers and that tramadol will not work.  [place] controlled substance database reveals Hydrocodone #60 on 3/2 and oxycodone #20 on 3/3. Patient does not recall what the oxycodone rx was for but states oxycodone makes him sick and he did not take it.  He does not know what mobic is but is "sure that it won't work."  Agreed to write a small quantity of hydrocodone for this acute pain ONLY and instructed him to take the mobic first to see if it provides relief. Continue heating pad and gentle stretching and massage. Light duty at work x 3 days. RTC precautions discussed. Also discussed that if he will be needed further pain medication for his chronic hernia pain he should either f/u with Dr. Carolynne Edouardoth and get CT as recommended or discuss pain management options.

## 2014-01-30 ENCOUNTER — Other Ambulatory Visit: Payer: Self-pay | Admitting: Physician Assistant

## 2014-02-18 ENCOUNTER — Other Ambulatory Visit: Payer: Self-pay | Admitting: Family Medicine

## 2014-02-27 ENCOUNTER — Ambulatory Visit (INDEPENDENT_AMBULATORY_CARE_PROVIDER_SITE_OTHER): Payer: 59 | Admitting: Family Medicine

## 2014-02-27 VITALS — BP 116/68 | HR 60 | Temp 98.2°F | Resp 18 | Ht 66.0 in | Wt 232.0 lb

## 2014-02-27 DIAGNOSIS — R519 Headache, unspecified: Secondary | ICD-10-CM

## 2014-02-27 DIAGNOSIS — R110 Nausea: Secondary | ICD-10-CM

## 2014-02-27 DIAGNOSIS — R51 Headache: Secondary | ICD-10-CM

## 2014-02-27 MED ORDER — PROMETHAZINE HCL 25 MG PO TABS: 25.0000 mg | ORAL_TABLET | Freq: Three times a day (TID) | ORAL | Status: DC | PRN

## 2014-02-27 MED ORDER — HYDROCODONE-ACETAMINOPHEN 10-325 MG PO TABS: 1.0000 | ORAL_TABLET | Freq: Three times a day (TID) | ORAL | Status: DC | PRN

## 2014-02-27 NOTE — Patient Instructions (Signed)
Migraine Headache A migraine headache is an intense, throbbing pain on one or both sides of your head. A migraine can last for 30 minutes to several hours. CAUSES  The exact cause of a migraine headache is not always known. However, a migraine may be caused when nerves in the brain become irritated and release chemicals that cause inflammation. This causes pain. Certain things may also trigger migraines, such as:  Alcohol.  Smoking.  Stress.  Menstruation.  Aged cheeses.  Foods or drinks that contain nitrates, glutamate, aspartame, or tyramine.  Lack of sleep.  Chocolate.  Caffeine.  Hunger.  Physical exertion.  Fatigue.  Medicines used to treat chest pain (nitroglycerine), birth control pills, estrogen, and some blood pressure medicines. SIGNS AND SYMPTOMS  Pain on one or both sides of your head.  Pulsating or throbbing pain.  Severe pain that prevents daily activities.  Pain that is aggravated by any physical activity.  Nausea, vomiting, or both.  Dizziness.  Pain with exposure to bright lights, loud noises, or activity.  General sensitivity to bright lights, loud noises, or smells. Before you get a migraine, you may get warning signs that a migraine is coming (aura). An aura may include:  Seeing flashing lights.  Seeing bright spots, halos, or zig-zag lines.  Having tunnel vision or blurred vision.  Having feelings of numbness or tingling.  Having trouble talking.  Having muscle weakness. DIAGNOSIS  A migraine headache is often diagnosed based on:  Symptoms.  Physical exam.  A CT scan or MRI of your head. These imaging tests cannot diagnose migraines, but they can help rule out other causes of headaches. TREATMENT Medicines may be given for pain and nausea. Medicines can also be given to help prevent recurrent migraines.  HOME CARE INSTRUCTIONS  Only take over-the-counter or prescription medicines for pain or discomfort as directed by your  health care provider. The use of long-term narcotics is not recommended.  Lie down in a dark, quiet room when you have a migraine.  Keep a journal to find out what may trigger your migraine headaches. For example, write down:  What you eat and drink.  How much sleep you get.  Any change to your diet or medicines.  Limit alcohol consumption.  Quit smoking if you smoke.  Get 7 9 hours of sleep, or as recommended by your health care provider.  Limit stress.  Keep lights dim if bright lights bother you and make your migraines worse. SEEK IMMEDIATE MEDICAL CARE IF:   Your migraine becomes severe.  You have a fever.  You have a stiff neck.  You have vision loss.  You have muscular weakness or loss of muscle control.  You start losing your balance or have trouble walking.  You feel faint or pass out.  You have severe symptoms that are different from your first symptoms. MAKE SURE YOU:   Understand these instructions.  Will watch your condition.  Will get help right away if you are not doing well or get worse. Document Released: 10/17/2005 Document Revised: 08/07/2013 Document Reviewed: 06/24/2013 [employer] Patient Information 2014 [employer].  

## 2014-02-27 NOTE — Progress Notes (Signed)
Chief Complaint:  Chief Complaint  Patient presents with  . Headache    since sunday     HPI: Troy Obrien is a 63 y.o. 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 NO URI sxs, has sinus issues but that is normal for him. HE ahs HTN but that has been well controlled. He has been eating and drinking well. Back of his head , the inside of his back. He has had HA in the past. Similar to this but last one was 2-3 yrs ago. He has had some nausea, no emesis.No photophobia, eye pain, CP, SOB, No vision changes. No weakness, numbness or tingling. Has had fioricet in the past, has had imitrex, the vicodin helped and then it eased up and then he got a refill and he has not had one this bad sicne that time. He has taken tylenol and  Ibuprofen, 2x. Not the worst HA of his life.     Past Medical History  Diagnosis Date  . Headache(784.0)   . Hypertension   . Hyperlipidemia   . Arthritis   . Inguinal hernia   . Gout   . Glaucoma    Past Surgical History  Procedure Laterality Date  . Circumcision  1972  . Tonsillectomy  1963  . Carpel tunnel      bilateral  . Inguinal hernia repair  10/11/2012    Procedure: HERNIA REPAIR INGUINAL ADULT;  Surgeon: Robyne AskewPaul S Toth III, MD;  [facility];  Service: General;  Laterality: Right;  . Insertion of mesh  10/11/2012    Procedure: INSERTION OF MESH;  Surgeon: Robyne AskewPaul S Toth III, MD;  [facility];  Service: General;  Laterality: Right;   History   Social History  . Marital Status: Married    Spouse Name: N/A    Number of Children: N/A  . Years of Education: N/A   Social History Main Topics  . Smoking status: Never Smoker   . Smokeless tobacco: Never Used  . Alcohol Use: 0.0 oz/week  . Drug Use: No  . Sexual Activity: Yes    Birth Control/ Protection: None   Other Topics Concern  . None   Social History Narrative  . None   History reviewed.  No pertinent family history. Allergies  Allergen Reactions  . Laxative [Bisacodyl] Hives  . Cyclobenzaprine Other (See Comments)    Hand twitching/spasms   Prior to Admission medications   Medication Sig Start Date End Date Taking? Authorizing Provider  brimonidine-timolol (COMBIGAN) 0.2-0.5 % ophthalmic solution Place 1 drop into both eyes at bedtime.    Yes Historical Provider, MD  CIALIS 20 MG tablet TAKE 1/2 TO 1 TABLET BY MOUTH 2 HOURS PRIOR TO ANTICIPATED INTERCOURSE   Yes Peyton Najjaravid H Hopper, MD  ezetimibe (ZETIA) 10 MG tablet Take 1 tablet (10 mg total) by mouth daily. NEED VISIT, LABS!!! 10/28/13  Yes Sherren MochaEva N Shaw, MD  latanoprost (XALATAN) 0.005 % ophthalmic solution Place 1 drop into both eyes at bedtime.    Yes Historical Provider, MD  losartan-hydrochlorothiazide (HYZAAR) 100-25 MG per tablet TAKE 1 TABLET BY MOUTH EVERY EVENING. 01/30/14  Yes Ryan M Dunn, PA-C  meloxicam [facility](MOBIC) 15 MG tablet One daily for pain and inflammation 10/05/13  Yes Peyton Najjaravid H Hopper, MD  butalbital-acetaminophen-caffeine [place](FIORICET, [facility]) [phone] MG per tablet  08/08/13   Historical Provider,  MD  clonazePAM (KLONOPIN) 0.5 MG tablet Take 1 tablet (0.5 mg total) by mouth 2 (two) times daily as needed for anxiety. 10/28/13   Sherren MochaEva N Shaw, MD  HYDROcodone-acetaminophen (NORCO) 10-325 MG per tablet Take 1 tablet by mouth every 6 (six) hours as needed. 11/09/13   Sherren MochaEva N Shaw, MD  zoster vaccine live, PF, (ZOSTAVAX) [id number] UNT/0.65ML injection Inject 19,400 Units into the skin once. 10/28/13   Sherren MochaEva N Shaw, MD     ROS: The patient denies fevers, chills, night sweats, unintentional weight loss, chest pain, palpitations, wheezing, dyspnea on exertion, nausea, vomiting, abdominal pain, dysuria, hematuria, melena, numbness, weakness, or tingling.   All other systems have been reviewed and were otherwise negative with the exception of those mentioned in the HPI and as above.    PHYSICAL EXAM: Filed Vitals:   02/27/14 1810  BP:  116/68  Pulse: 60  Temp: 98.2 F (36.8 C)  Resp: 18   Filed Vitals:   02/27/14 1810  Height: 5\' 6"  (1.676 m)  Weight: 232 lb (105.235 kg)   Body mass index is 37.46 kg/(m^2).  General: Alert, no acute distress HEENT:  Normocephalic, atraumatic, oropharynx patent. EOMI, PERRLA. Fundo exam normal Cardiovascular:  Regular rate and rhythm, no rubs murmurs or gallops.  No Carotid bruits, radial pulse intact. No pedal edema.  Respiratory: Clear to auscultation bilaterally.  No wheezes, rales, or rhonchi.  No cyanosis, no use of accessory musculature GI: No organomegaly, abdomen is soft and non-tender, positive bowel sounds.  No masses. Skin: No rashes. Neurologic: Facial musculature symmetric. CN 2-12 grossly nl Psychiatric: Patient is appropriate throughout our interaction. Lymphatic: No cervical lymphadenopathy Musculoskeletal: Gait intact.   LABS: Results for orders placed in visit on 10/28/13  COMPREHENSIVE METABOLIC PANEL      Result Value Ref Range   Sodium 137  135 - 145 mEq/L   Potassium 4.6  3.5 - 5.3 mEq/L   Chloride 103  96 - 112 mEq/L   CO2 24  19 - 32 mEq/L   Glucose, Bld 80  70 - 99 mg/dL   BUN 19  6 - 23 mg/dL   Creat 6.041.18  5.400.50 - 9.811.35 mg/dL   Total Bilirubin 0.4  0.3 - 1.2 mg/dL   Alkaline Phosphatase 50  39 - 117 U/L   AST 21  0 - 37 U/L   ALT 28  0 - 53 U/L   Total Protein 7.6  6.0 - 8.3 g/dL   Albumin 4.4  3.5 - 5.2 g/dL   Calcium 9.8  8.4 - 19.110.5 mg/dL  TSH      Result Value Ref Range   TSH 0.934  0.350 - 4.500 uIU/mL  POCT CBC      Result Value Ref Range   WBC 7.0  4.6 - 10.2 K/uL   Lymph, poc 2.4  0.6 - 3.4   POC LYMPH PERCENT 34.5  10 - 50 %L   MID (cbc) 0.7  0 - 0.9   POC MID % 9.7  0 - 12 %M   POC Granulocyte 3.9  2 - 6.9   Granulocyte percent 55.8  37 - 80 %G   RBC 4.97  4.69 - 6.13 M/uL   Hemoglobin 13.5 (*) 14.1 - 18.1 g/dL   HCT, POC 47.843.2 (*) 29.543.5 - 53.7 %   MCV 86.9  80 - 97 fL   MCH, POC 27.2  27 - 31.2 pg   MCHC 31.3 (*) 31.8 - 35.4  g/dL  RDW, POC 15.7     Platelet Count, POC 251  142 - 424 K/uL   MPV 9.8  0 - 99.8 fL     EKG/XRAY:   Primary read interpreted by Dr. Conley Rolls at [facility].   ASSESSMENT/PLAN: Encounter Diagnoses  Name Primary?  . Headache Yes  . Nausea alone    Rx Norco ( advise pt about rebound HA) , he declines fioricet and/or triptans due to lack of efficacy Rx Promethazine prn F/u prn Declined Toradol injection since afraid of needles I told him to ask his opthalmologist if he can take nasacort otc for sinuses and also for how long.   Gross sideeffects, risk and benefits, and alternatives of medications d/w patient. Patient is aware that all medications have potential sideeffects and we are unable to predict every sideeffect or drug-drug interaction that may occur.  Lenell Antu, DO 02/27/2014 6:54 PM

## 2014-03-01 ENCOUNTER — Other Ambulatory Visit: Payer: Self-pay | Admitting: Physician Assistant

## 2014-03-15 ENCOUNTER — Ambulatory Visit (INDEPENDENT_AMBULATORY_CARE_PROVIDER_SITE_OTHER): Payer: 59 | Admitting: Family Medicine

## 2014-03-15 ENCOUNTER — Ambulatory Visit: Payer: 59

## 2014-03-15 VITALS — BP 104/70 | HR 64 | Temp 97.5°F | Resp 18 | Ht 65.71 in | Wt 222.1 lb

## 2014-03-15 DIAGNOSIS — S239XXA Sprain of unspecified parts of thorax, initial encounter: Secondary | ICD-10-CM

## 2014-03-15 DIAGNOSIS — M62838 Other muscle spasm: Secondary | ICD-10-CM

## 2014-03-15 DIAGNOSIS — M546 Pain in thoracic spine: Secondary | ICD-10-CM

## 2014-03-15 DIAGNOSIS — S29012A Strain of muscle and tendon of back wall of thorax, initial encounter: Secondary | ICD-10-CM

## 2014-03-15 DIAGNOSIS — I739 Peripheral vascular disease, unspecified: Secondary | ICD-10-CM

## 2014-03-15 LAB — POCT SEDIMENTATION RATE: POCT SED RATE: 28 mm/hr — AB (ref 0–22)

## 2014-03-15 LAB — POCT CBC
Granulocyte percent: 56.8 %G (ref 37–80)
HEMATOCRIT: 44 % (ref 43.5–53.7)
Hemoglobin: 14.2 g/dL (ref 14.1–18.1)
Lymph, poc: 1.6 (ref 0.6–3.4)
MCH: 27.1 pg (ref 27–31.2)
MCHC: 32.3 g/dL (ref 31.8–35.4)
MCV: 83.9 fL (ref 80–97)
MID (CBC): 0.5 (ref 0–0.9)
MPV: 11.2 fL (ref 0–99.8)
PLATELET COUNT, POC: 241 10*3/uL (ref 142–424)
POC Granulocyte: 2.7 (ref 2–6.9)
POC LYMPH %: 32.9 % (ref 10–50)
POC MID %: 10.3 %M (ref 0–12)
RBC: 5.24 M/uL (ref 4.69–6.13)
RDW, POC: 13.5 %
WBC: 4.8 10*3/uL (ref 4.6–10.2)

## 2014-03-15 LAB — APTT: APTT: 30 s (ref 24–37)

## 2014-03-15 MED ORDER — HYDROCODONE-ACETAMINOPHEN 10-325 MG PO TABS: 1.0000 | ORAL_TABLET | Freq: Three times a day (TID) | ORAL | Status: DC | PRN

## 2014-03-15 NOTE — Patient Instructions (Addendum)
Thoracic Strain You have injured the muscles or tendons that attach to the upper part of your back behind your chest. This injury is called a thoracic strain, thoracic sprain, or mid-back strain.  CAUSES  The cause of thoracic strain varies. A less severe injury involves pulling a muscle or tendon without tearing it. A more severe injury involves tearing (rupturing) a muscle or tendon. With less severe injuries, there may be little loss of strength. Sometimes, there are breaks (fractures) in the bones to which the muscles are attached. These fractures are rare, unless there was a direct hit (trauma) or you have weak bones due to osteoporosis or age. Longstanding strains may be caused by overuse or improper form during certain movements. Obesity can also increase your risk for back injuries. Sudden strains may occur due to injury or not warming up properly before exercise. Often, there is no obvious cause for a thoracic strain. SYMPTOMS  The main symptom is pain, especially with movement, such as during exercise. DIAGNOSIS  Your caregiver can usually tell what is wrong by taking an X-ray and doing a physical exam. TREATMENT   Physical therapy may be helpful for recovery. Your caregiver can give you exercises to do or refer you to a physical therapist after your pain improves.  After your pain improves, strengthening and conditioning programs appropriate for your sport or occupation may be helpful.  Always warm up before physical activities or athletics. Stretching after physical activity may also help.  Certain over-the-counter medicines may also help. Ask your caregiver if there are medicines that would help you. If this is your first thoracic strain injury, proper care and proper healing time before starting activities should prevent long-term problems. Torn ligaments and tendons require as long to heal as broken bones. Average healing times may be only 1 week for a mild strain. For torn muscles  and tendons, healing time may be up to 6 weeks to 2 months. HOME CARE INSTRUCTIONS   Apply ice to the injured area. Ice massages may also be used as directed.  Put ice in a plastic bag.  Place a towel between your skin and the bag.  Leave the ice on for 15-20 minutes, 03-04 times a day, for the first 2 days.  Only take over-the-counter or prescription medicines for pain, discomfort, or fever as directed by your caregiver.  Keep your appointments for physical therapy if this was prescribed.  Use wraps and back braces as instructed. SEEK IMMEDIATE MEDICAL CARE IF:   You have an increase in bruising, swelling, or pain.  Your pain has not improved with medicines.  You develop new shortness of breath, chest pain, or fever.  Problems seem to be getting worse rather than better. MAKE SURE YOU:   Understand these instructions.  Will watch your condition.  Will get help right away if you are not doing well or get worse. Document Released: 01/07/2004 Document Revised: 01/09/2012 Document Reviewed: 12/03/2010 [facility] Patient Information 2014 [place], [place]. Sjgren's Syndrome Sjgren's syndrome is a disease in which the body's natural defense system (immune system) turns against the body's own cells and attacks the body's glands that produce tears and saliva. Sjgren's syndrome is sometimes linked to rheumatic disorders, such as rheumatoid arthritis and lupus. It is 10 times more common in women than in men. Most people with Sjgren's syndrome are from 5145 to 61 years of age. CAUSES  The cause is unknown. It runs in families. It is possible that a trigger, such as a  viral infection, can set it off. SYMPTOMS  The main symptoms are:  Dry mouth.  Chalky feeling, mouth feels like it is full of cotton.  Difficulty swallowing, speaking, or tasting.  Prone to cavities and mouth infections.  Dry eyes.  Burning, itching, feels like sand in the eyes.  Blurry vision.  Light  sensitive. Other symptoms may include:  Skin, nose, and vaginal dryness.  Joint pain, stiffness, and muscle pain. Other organs that may be affected include:  Kidneys.  Blood vessels.  Lungs.  Liver.  Pancreas.  Brain. DIAGNOSIS  Diagnosis is first based on symptoms, medical history, and physical exam. Tests may also be done, including:  Tear production test (Schirmer's test).  A thorough eye exam using a magnifying device (slit-lamp exam).  Test to see the extent of eye damage using dye staining.  Mouth exam to look for signs of salivary gland swelling and mouth dryness.  Removal of a minor salivary gland from inside the lower lip to be studied under a microscope (lip biopsy).  Blood tests. Routine and special blood studies will be checked. Antibodies that attack normal tissue (autoantibodies) may be present, such as antinuclear antibodies (ANAs), rheumatoid factors, and Sjgren's antibodies (anti-SSA and anti-SSB).  Chest X-ray.  Urine tests (urinalysis). TREATMENT  There is no known cure for this syndrome. There is no specific treatment to restore gland secretion, either. Treatment varies and is generally based upon the problems present.  Moisture replacement therapies may ease the symptoms of dryness.  Nonsteroidal anti-inflammatory drugs (NSAIDs), such as ibuprofen, may be used to treat musculoskeletal symptoms.  Corticosteroids, such as prednisone, may be given for people with severe complications.  Immunosuppressive drugs may be prescribed to control overactivity of the immune system. In severe cases, this overactivity can lead to organ damage.  A surgical procedure called punctal occlusion may be considered. This is done to close the tear ducts, helping to keep more natural tears on the eye's surface. A small percentage of people with Sjgren's syndrome develop lymphoma. If you are worried that you might develop lymphoma, talk to your caregiver to learn more  about the disease and the symptoms to watch. HOME CARE INSTRUCTIONS   Eye care:  Use eyedrops as specified by your caregiver.  Try to blink 5 to 6 times per minute.  Protect your eyes from drafts and breezes.  Maintain properly humidified air.  Avoid smoke.  Mouth care:  Sugar-free gum and hard candy can help in some people.  Take frequent sips of water or sugar-free drinks.  Use lip balm, saliva substitutes, and prescription medicines as directed. SEEK MEDICAL CARE IF:   You have an oral temperature above 102 F (38.9 C).  You have night sweats.  You develop constant fatigue.  You have unexplained weight loss.  You develop itchy skin.  You have reddened patches on the skin. FOR MORE INFORMATION  Sjgren's Syndrome Foundation: www.sjogrens.[facility]org National Institute of Arthritis and Musculoskeletal and Skin Diseases: www.niams.http://www.myers.net/nih.gov Document Released: 10/07/2002 Document Revised: 01/09/2012 Document Reviewed: 02/22/2010 [facility] Patient Information 2014 [place] Square[place]Care, [place]LL[place].

## 2014-03-15 NOTE — Progress Notes (Signed)
Subjective:    Patient ID: Troy Obrien, male    DOB: [birth year], 63 y.o.   MRN: [id number] Chief Complaint  Patient presents with  . Back Pain    under shoulder blade, near center  . 'Cold Fingers"    HPI This chart was scribed for Sherren MochaEva N Shaw, MD by Charline BillsEssence Howell, ED Scribe. The patient was seen in room 1. Patient's care was started at 5:05 PM.  HPI Comments: Troy Obrien is a 63 y.o. male who presents to the [facility] with his wife complaining of severe, gradually worsening R upper back pain that radiates to R shoulder and R arm worsened over the past 2 weeks. He denies fall. He reports initial pain after heavy lifting approximately 2 months ago. Pain is worsened with cervical extension, improved with cervical flexion.   Pt reports coldness to fingers and toes onset 1 week ago. He denies pain, numbness, or tingling to his fingers and toes. He denies blue color to fingers. He has tried placing his feet in hot water with temporary relief. He denies changes in medication.  Also complainng of increased problems with ED.  He also reports unsuccessful blood draw a few days ago for life insurance. He states that his blood coagulated in the tube and they could only get a few drops so they denied him.  Pt has intentionally lost 20+ lbs due to eating healthy and a book entitled Fast Metabolism.  His wife, who is also a pt of mine.  Pt did vocalize his concerns of being perceived as drug-seeking to his prn hydrocodone use and the fact that nsaids and muscle relaxants don't work for him - adamant that he feels he has no secondary gain by taking hydrocodone. Pt is still taking Meloxicam.    Past Medical History  Diagnosis Date  . Headache(784.0)   . Hypertension   . Hyperlipidemia   . Arthritis   . Inguinal hernia   . Gout   . Glaucoma    Current Outpatient Prescriptions on File Prior to Visit  Medication Sig Dispense Refill  . brimonidine-timolol (COMBIGAN)  0.2-0.5 % ophthalmic solution Place 1 drop into both eyes 2 (two) times daily.       Marland Kitchen. CIALIS 20 MG tablet TAKE 1/2 TO 1 TABLET BY MOUTH 2 HOURS PRIOR TO ANTICIPATED INTERCOURSE  30 tablet  5  . ezetimibe (ZETIA) 10 MG tablet Take 1 tablet (10 mg total) by mouth daily. NEED VISIT, LABS!!!  30 tablet  5  . latanoprost (XALATAN) 0.005 % ophthalmic solution Place 1 drop into both eyes at bedtime.       Marland Kitchen. losartan-hydrochlorothiazide (HYZAAR) 100-25 MG per tablet TAKE 1 TABLET BY MOUTH EVERY EVENING.  90 tablet  0  . meloxicam (MOBIC) 15 MG tablet One daily for pain and inflammation  30 tablet  5  . promethazine (PHENERGAN) 25 MG tablet Take 1 tablet (25 mg total) by mouth every 8 (eight) hours as needed for nausea or vomiting.  20 tablet  0  . zoster vaccine live, PF, (ZOSTAVAX) [id number] UNT/0.65ML injection Inject 19,400 Units into the skin once.  1 each  0   No current facility-administered medications on file prior to visit.   Allergies  Allergen Reactions  . Laxative [Bisacodyl] Hives  . Cyclobenzaprine Other (See Comments)    Hand twitching/spasms    Review of Systems  Constitutional: Negative for activity change, appetite change, fatigue and unexpected weight change.  Cardiovascular: Negative  for palpitations and leg swelling.  Endocrine: Positive for cold intolerance. Negative for polyphagia.  Musculoskeletal: Positive for arthralgias, back pain, myalgias and neck pain. Negative for gait problem and joint swelling.  Skin: Negative for color change, pallor, rash and wound.  Allergic/Immunologic: Negative for immunocompromised state.  Neurological: Negative for dizziness, tremors, syncope, weakness, light-headedness and numbness.  Hematological: Does not bruise/bleed easily.      Objective:   Physical Exam  Nursing note and vitals reviewed. Constitutional: He appears well-developed and well-nourished. No distress.  HENT:  Head: Normocephalic and atraumatic.  Neck: Neck supple.  No  extension Lateral rotaton to 45 degrees bilaterally Lateral flexion to 45 degrees on L, 20 degress on R   Cardiovascular:  Pulses:      Radial pulses are 2+ on the right side, and 2+ on the left side.  Pulmonary/Chest: Effort normal.  Musculoskeletal:       Cervical back: He exhibits decreased range of motion and tenderness. He exhibits no bony tenderness.       Thoracic back: He exhibits decreased range of motion, tenderness, bony tenderness, pain and spasm. He exhibits no swelling and no deformity.  Pain over upper thoracic spinous processes L upper thoracic paraspinal muscles Severe rhomboid muscle spasms  Neurological: He is alert.  Reflex Scores:      Tricep reflexes are 2+ on the right side and 2+ on the left side.      Bicep reflexes are 2+ on the right side and 2+ on the left side.      Brachioradialis reflexes are 2+ on the right side and 2+ on the left side. Skin: He is not diaphoretic.  Fingers are cool to touch with capillary refill 4-5 seconds bilaterally    [facility] reading (PRIMARY) by  Dr. Clelia Croft.   Thoracic spine XR: endplate spurring in lower thoracic levels, No acute abnormality. EXAM: THORACIC SPINE - 2 VIEW  COMPARISON: DG CHEST 2V dated 10/28/2013  FINDINGS: The thoracic vertebral bodies are preserved in height. There is calcification of the anterior longitudinal ligament of the mid and lower thoracic spine. There are osteophytes at multiple thoracic levels on the right. The intervertebral disc space heights are reasonably well maintained. The pedicles appear intact. There are no abnormal paravertebral soft tissue densities.  IMPRESSION: There is no acute bony abnormality of the thoracic spine. There is mild degenerative change with calcification of the anterior longitudinal ligament and multi level right-sided osteophytes.   Assessment & Plan:   Thoracic back pain - Plan: DG Thoracic Spine 2 View, Protime-INR, Ambulatory referral to Physical  Therapy  Peripheral vasoconstriction - Plan: POCT CBC, POCT SEDIMENTATION RATE, Uric acid, APTT, C-reactive protein, Protime-INR, CANCELED: INR - labs pending.  If persists consider trial of ccb or referral to rheum vs vascular.  Strain of thoracic paraspinal muscles excluding T1 and T2 levels - Plan: Ambulatory referral to Physical Therapy  Trapezius muscle spasm - Plan: Ambulatory referral to Physical Therapy  Meds ordered this encounter  Medications  . bimatoprost (LUMIGAN) 0.01 % SOLN    Sig: Place 1 drop into both eyes at bedtime.  . butalbital-acetaminophen-caffeine (FIORICET, ESGIC) 50-325-40 MG per tablet    Sig:   . HYDROcodone-acetaminophen (NORCO) 10-325 MG per tablet    Sig: Take 1 tablet by mouth every 8 (eight) hours as needed.    Dispense:  30 tablet    Refill:  0    I personally performed the services described in this documentation, which was scribed in my presence.  The recorded information has been reviewed and considered, and addended by me as needed.  [name], MD MPH  Over 40 minutes of face-to-face interaction spent with pt.

## 2014-03-16 ENCOUNTER — Encounter: Payer: Self-pay | Admitting: Family Medicine

## 2014-03-16 LAB — PROTIME-INR
INR: 1.05 (ref <1.50)
PROTHROMBIN TIME: 13.6 s (ref 11.6–15.2)

## 2014-03-16 LAB — URIC ACID: Uric Acid, Serum: 6.9 mg/dL (ref 4.0–7.8)

## 2014-03-16 LAB — C-REACTIVE PROTEIN: CRP: 0.5 mg/dL (ref <0.60)

## 2014-03-18 ENCOUNTER — Telehealth: Payer: Self-pay | Admitting: Family Medicine

## 2014-03-18 NOTE — Telephone Encounter (Signed)
Message copied by Tilman NeatPETTY, Amairany Schumpert L on Tue Mar 18, 2014  9:42 AM ------      Message from: Norberto SorensonSHAW, EVA      Created: Sat Mar 15, 2014  5:23 PM       Please have pt sched for a full CPE with fasting labs      ES ------

## 2014-03-18 NOTE — Telephone Encounter (Signed)
Pt requested call back at noon today to schedule appt.

## 2014-03-26 ENCOUNTER — Ambulatory Visit (INDEPENDENT_AMBULATORY_CARE_PROVIDER_SITE_OTHER): Payer: 59 | Admitting: Family Medicine

## 2014-03-26 VITALS — BP 104/64 | HR 56 | Temp 97.7°F | Resp 18 | Ht 66.5 in | Wt 221.4 lb

## 2014-03-26 DIAGNOSIS — Z23 Encounter for immunization: Secondary | ICD-10-CM

## 2014-03-26 DIAGNOSIS — M546 Pain in thoracic spine: Secondary | ICD-10-CM

## 2014-03-26 DIAGNOSIS — Z0000 Encounter for general adult medical examination without abnormal findings: Secondary | ICD-10-CM

## 2014-03-26 DIAGNOSIS — R0981 Nasal congestion: Secondary | ICD-10-CM

## 2014-03-26 DIAGNOSIS — H409 Unspecified glaucoma: Secondary | ICD-10-CM

## 2014-03-26 DIAGNOSIS — J3489 Other specified disorders of nose and nasal sinuses: Secondary | ICD-10-CM

## 2014-03-26 DIAGNOSIS — G8929 Other chronic pain: Secondary | ICD-10-CM

## 2014-03-26 DIAGNOSIS — G4730 Sleep apnea, unspecified: Secondary | ICD-10-CM

## 2014-03-26 HISTORY — DX: Pain in thoracic spine: M54.6

## 2014-03-26 HISTORY — DX: Other chronic pain: G89.29

## 2014-03-26 LAB — POCT URINALYSIS DIPSTICK
BILIRUBIN UA: NEGATIVE
Glucose, UA: NEGATIVE
KETONES UA: NEGATIVE
Leukocytes, UA: NEGATIVE
Nitrite, UA: NEGATIVE
PROTEIN UA: NEGATIVE
Spec Grav, UA: 1.01
Urobilinogen, UA: 0.2
pH, UA: 5.5

## 2014-03-26 MED ORDER — OXYCODONE-ACETAMINOPHEN 7.5-325 MG PO TABS: 1.0000 | ORAL_TABLET | ORAL | Status: DC | PRN

## 2014-03-26 MED ORDER — OXYCODONE-ACETAMINOPHEN 10-325 MG PO TABS: 1.0000 | ORAL_TABLET | Freq: Three times a day (TID) | ORAL | Status: DC | PRN

## 2014-03-26 MED ORDER — FLUTICASONE PROPIONATE 50 MCG/ACT NA SUSP: 2.0000 | Freq: Every day | NASAL | Status: DC

## 2014-03-26 NOTE — Progress Notes (Signed)
Subjective:    Patient ID: Troy Obrien, male    DOB: February 13, 1951, 63 y.o.   MRN: [id number] Chief Complaint  Patient presents with  . Annual Exam    Not Fasting   HPI  Has been going to PT but feels like right upper back pain has gotten much worse - especially hurts bad at night - has been needing to use 2 hydrocodone at night to get any sleep at all - has been hanging right arm off bed to get comfortable.  First day of PT it did get better but since then it has not improved at all. Willing to keep doing PT even though not helping but will need stronger pain medication.  Eye dr is at [facility] - is going to change now that he is living in [place]. Has stage 4 glaucoma and put on eye drops but told he will be blind in about 10 yrs. Has not had any eye surgeries for this.  Following new high metabolism diet w/ his wife who is cooking all the food.  Past Medical History  Diagnosis Date  . Headache(784.0)   . Hypertension   . Hyperlipidemia   . Arthritis   . Inguinal hernia   . Gout   . Glaucoma    Past Surgical History  Procedure Laterality Date  . Circumcision  1972  . Tonsillectomy  1963  . Carpel tunnel      bilateral  . Inguinal hernia repair  10/11/2012    Procedure: HERNIA REPAIR INGUINAL ADULT;  Surgeon: Robyne Askew, MD;  Location: [facility] OR;  Service: General;  Laterality: Right;  . Insertion of mesh  10/11/2012    Procedure: INSERTION OF MESH;  Surgeon: Robyne Askew, MD;  Location: [facility] OR;  Service: General;  Laterality: Right;   Current Outpatient Prescriptions on File Prior to Visit  Medication Sig Dispense Refill  . bimatoprost (LUMIGAN) 0.01 % SOLN Place 1 drop into both eyes at bedtime.      . brimonidine-timolol (COMBIGAN) 0.2-0.5 % ophthalmic solution Place 1 drop into both eyes 2 (two) times daily.       Marland Kitchen CIALIS 20 MG tablet TAKE 1/2 TO 1 TABLET BY MOUTH 2 HOURS PRIOR TO ANTICIPATED INTERCOURSE  30 tablet  5  . ezetimibe (ZETIA) 10 MG tablet  Take 1 tablet (10 mg total) by mouth daily. NEED VISIT, LABS!!!  30 tablet  5  . losartan-hydrochlorothiazide (HYZAAR) 100-25 MG per tablet TAKE 1 TABLET BY MOUTH EVERY EVENING.  90 tablet  0  . meloxicam (MOBIC) 15 MG tablet One daily for pain and inflammation  30 tablet  5  . butalbital-acetaminophen-caffeine (FIORICET, ESGIC) 50-325-40 MG per tablet       . latanoprost (XALATAN) 0.005 % ophthalmic solution Place 1 drop into both eyes at bedtime.       . zoster vaccine live, PF, (ZOSTAVAX) 13244 UNT/0.65ML injection Inject 19,400 Units into the skin once.  1 each  0   No current facility-administered medications on file prior to visit.   Allergies  Allergen Reactions  . Laxative [Bisacodyl] Hives  . Cyclobenzaprine Other (See Comments)    Hand twitching/spasms   No family history on file. History   Social History  . Marital Status: Married    Spouse Name: N/A    Number of Children: N/A  . Years of Education: N/A   Social History Main Topics  . Smoking status: Never Smoker   . Smokeless tobacco:  Never Used  . Alcohol Use: 0.0 oz/week     Comment: rare  . Drug Use: No  . Sexual Activity: Yes    Birth Control/ Protection: None   Other Topics Concern  . None   Social History Narrative  . None    Review of Systems  Constitutional: Positive for activity change. Negative for unexpected weight change.  HENT: Positive for congestion and postnasal drip.   Eyes: Positive for visual disturbance. Negative for pain and itching.  Respiratory: Positive for apnea. Negative for chest tightness.   Cardiovascular: Negative for chest pain, palpitations and leg swelling.  Musculoskeletal: Positive for arthralgias, back pain, myalgias, neck pain and neck stiffness. Negative for gait problem and joint swelling.  Neurological: Positive for weakness, numbness and headaches.  Hematological: Negative for adenopathy.  Psychiatric/Behavioral: Positive for sleep disturbance. Negative for  dysphoric mood. The patient is not nervous/anxious.   All other systems reviewed and are negative.     BP 104/64  Pulse 56  Temp(Src) 97.7 F (36.5 C) (Oral)  Resp 18  Ht 5' 6.5" (1.689 m)  Wt 221 lb 6.4 oz (100.426 kg)  BMI 35.20 kg/m2  SpO2 98% Objective:   Physical Exam  Constitutional: He is oriented to person, place, and time. He appears well-developed and well-nourished. No distress.  HENT:  Head: Normocephalic and atraumatic.  Right Ear: Tympanic membrane, external ear and ear canal normal.  Left Ear: Tympanic membrane, external ear and ear canal normal.  Nose: Nose normal.  Mouth/Throat: Uvula is midline, oropharynx is clear and moist and mucous membranes are normal. No oropharyngeal exudate.  Eyes: Conjunctivae are normal. Right eye exhibits no discharge. Left eye exhibits no discharge. No scleral icterus.  Neck: Normal range of motion. Neck supple. No thyromegaly present.  Cardiovascular: Normal rate, regular rhythm, normal heart sounds and intact distal pulses.   Pulmonary/Chest: Effort normal and breath sounds normal. No respiratory distress.  Abdominal: Soft. Bowel sounds are normal. He exhibits no distension and no mass. There is no tenderness. There is no rebound and no guarding.  Genitourinary: Rectum normal and prostate normal. Rectal exam shows no mass and anal tone normal. Prostate is not enlarged and not tender.  Musculoskeletal: He exhibits no edema.  Lymphadenopathy:    He has no cervical adenopathy.  Neurological: He is alert and oriented to person, place, and time. He has normal reflexes. No cranial nerve deficit. He exhibits normal muscle tone.  Skin: Skin is warm and dry. No rash noted. He is not diaphoretic. No erythema.  Psychiatric: He has a normal mood and affect. His behavior is normal.          Assessment & Plan:   Routine general medical examination at a health care facility - Plan: POCT urinalysis dipstick, TSH, Lipid panel, Comprehensive  metabolic panel, PSA, Tdap vaccine greater than or equal to 7yo IM - not fasting but has not had lipid panel prior so will go ahead and check today to get some idea of where he is at, then will need fasting labs at next OV.  Nasal congestion - suspect this is contributing sig to his OSA but cant use nasal steroids or decongestants due to medical problems. Cons ENT referral if continues to impair sleep after weight loss - or may benefit from dental device during sleep (by Dr. Irene Limbo)  Need for prophylactic vaccination with combined diphtheria-tetanus-pertussis (DTP) vaccine - Plan: Tdap vaccine greater than or equal to 7yo IM  Sleep apnea - will hopefully  resolve some with the weightloss - cont diet and exercise - has lost 26 lbs so far w/ the help of his wife! Has not had formal PSNG as currently not interested at all in poss cpap therapy which is fine since he is currently doing great w/ weight loss.  Thoracic back pain - need to continue w/ PT - pt reluctantly agrees but needs stronger pain medicine so he can sleep at night since hurting worse w/ PT so rec oxycodone 10mg .  If pt fails PT or pain is continuing to worsen in another month, recommend ortho referral.  Pt NOT on chronic narcotics but seems to be heading that way unfortunately.  Gets very angry if you try to bring up issues of tolerance and dependence on narcotics w/ him - never mind addiction.  Glaucoma - advised NOT to use flonase, stage 4 - recommend pt see retinal or glaucoma specialist at [facility]  Meds ordered this encounter  Medications  . aspirin 81 MG tablet    Sig: Take 81 mg by mouth daily.  Marland Kitchen. DISCONTD: oxyCODONE-acetaminophen (PERCOCET) 7.5-325 MG per tablet    Sig: Take 1 tablet by mouth every 4 (four) hours as needed for pain.    Dispense:  30 tablet    Refill:  0  . oxyCODONE-acetaminophen (PERCOCET) 10-325 MG per tablet    Sig: Take 1 tablet by mouth every 8 (eight) hours as needed for  pain.    Dispense:  30 tablet    Refill:  0  . DISCONTD: fluticasone (FLONASE) 50 MCG/ACT nasal spray    Sig: Place 2 sprays into both nostrils at bedtime.    Dispense:  16 g    Refill:  2    [name], MD MPH

## 2014-03-26 NOTE — Patient Instructions (Signed)

## 2014-03-27 LAB — LIPID PANEL
Cholesterol: 163 mg/dL (ref 0–200)
HDL: 54 mg/dL (ref >39)
LDL Cholesterol: 95 mg/dL (ref 0–99)
TRIGLYCERIDES: 72 mg/dL (ref <150)
Total CHOL/HDL Ratio: 3 Ratio
VLDL: 14 mg/dL (ref 0–40)

## 2014-03-27 LAB — COMPREHENSIVE METABOLIC PANEL
ALK PHOS: 49 U/L (ref 39–117)
ALT: 20 U/L (ref 0–53)
AST: 20 U/L (ref 0–37)
Albumin: 4.7 g/dL (ref 3.5–5.2)
BUN: 29 mg/dL — ABNORMAL HIGH (ref 6–23)
CO2: 26 mEq/L (ref 19–32)
CREATININE: 1.58 mg/dL — AB (ref 0.50–1.35)
Calcium: 10.2 mg/dL (ref 8.4–10.5)
Chloride: 99 mEq/L (ref 96–112)
Glucose, Bld: 88 mg/dL (ref 70–99)
Potassium: 4.7 mEq/L (ref 3.5–5.3)
Sodium: 136 mEq/L (ref 135–145)
Total Bilirubin: 0.7 mg/dL (ref 0.2–1.2)
Total Protein: 8.3 g/dL (ref 6.0–8.3)

## 2014-03-27 LAB — TSH: TSH: 0.947 u[IU]/mL (ref 0.350–4.500)

## 2014-03-28 LAB — PSA: PSA: 1.15 ng/mL (ref <=4.00)

## 2014-03-31 ENCOUNTER — Other Ambulatory Visit: Payer: Self-pay | Admitting: Physician Assistant

## 2014-03-31 ENCOUNTER — Encounter: Payer: Self-pay | Admitting: Family Medicine

## 2014-03-31 NOTE — Telephone Encounter (Signed)
Dr Clelia Croft, you just saw pt for chronic issues but don't see HTN discussed. Can we RF?

## 2014-03-31 NOTE — Telephone Encounter (Signed)
Needs OV for repeat labs before further refills.

## 2014-04-03 NOTE — Progress Notes (Signed)
LMVM to CB to schedule an appt with Dr. Clelia Croft.

## 2014-04-11 ENCOUNTER — Ambulatory Visit (INDEPENDENT_AMBULATORY_CARE_PROVIDER_SITE_OTHER): Payer: 59 | Admitting: Family Medicine

## 2014-04-11 ENCOUNTER — Encounter: Payer: Self-pay | Admitting: Family Medicine

## 2014-04-11 VITALS — BP 112/65 | HR 49 | Temp 97.9°F | Resp 16 | Ht 66.5 in | Wt 223.4 lb

## 2014-04-11 DIAGNOSIS — Z79899 Other long term (current) drug therapy: Secondary | ICD-10-CM

## 2014-04-11 DIAGNOSIS — I7300 Raynaud's syndrome without gangrene: Secondary | ICD-10-CM

## 2014-04-11 DIAGNOSIS — M25819 Other specified joint disorders, unspecified shoulder: Secondary | ICD-10-CM

## 2014-04-11 DIAGNOSIS — N179 Acute kidney failure, unspecified: Secondary | ICD-10-CM

## 2014-04-11 DIAGNOSIS — R252 Cramp and spasm: Secondary | ICD-10-CM

## 2014-04-11 DIAGNOSIS — M7540 Impingement syndrome of unspecified shoulder: Secondary | ICD-10-CM

## 2014-04-11 DIAGNOSIS — E119 Type 2 diabetes mellitus without complications: Secondary | ICD-10-CM

## 2014-04-11 DIAGNOSIS — M7580 Other shoulder lesions, unspecified shoulder: Secondary | ICD-10-CM

## 2014-04-11 DIAGNOSIS — H409 Unspecified glaucoma: Secondary | ICD-10-CM

## 2014-04-11 DIAGNOSIS — N529 Male erectile dysfunction, unspecified: Secondary | ICD-10-CM

## 2014-04-11 MED ORDER — HYDROCODONE-ACETAMINOPHEN 10-325 MG PO TABS: 1.0000 | ORAL_TABLET | Freq: Four times a day (QID) | ORAL | Status: DC | PRN

## 2014-04-11 NOTE — Patient Instructions (Signed)
Raynaud's Syndrome Raynaud's Syndrome is a disorder of the blood vessels in your hands and feet. It occurs when small arteries of the arms/hands or legs/feet become sensitive to cold or emotional upset. This causes the arteries to constrict, or narrow, and reduces blood flow to the area. The color in the fingers or toes changes from white to bluish to red and this is not usually painful. There may be numbness and tingling. Sores on the skin (ulcers) can form. Symptoms are usually relieved by warming. HOME CARE INSTRUCTIONS   Avoid exposure to cold. Keep your whole body warm and dry. Dress in layers. Wear mittens or gloves when handling ice or frozen food and when outdoors. Use holders for glasses or cans containing cold drinks. If possible, stay indoors during cold weather.  Limit your use of caffeine. Switch to decaffeinated coffee, tea, and soda pop. Avoid chocolate.  Avoid smoking or being around cigarette smoke. Smoke will make symptoms worse.  Wear loose fitting socks and comfortable, roomy shoes.  Avoid vibrating tools and machinery.  If possible, avoid stressful and emotional situations. Exercise, meditation and yoga may help you cope with stress. Biofeedback may be useful.  Ask your caregiver about medicine (calcium channel blockers) that may control Raynaud's phenomena. SEEK MEDICAL CARE IF:   Your discomfort becomes worse, despite conservative treatment.  You develop sores on your fingers and toes that do not heal. Document Released: 10/14/2000 Document Revised: 01/09/2012 Document Reviewed: 10/21/2008 Exit[employer] Patient Information 2014 [employer].  
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
## 2014-04-12 LAB — HEMOGLOBIN A1C
Hgb A1c MFr Bld: 6.6 % — ABNORMAL HIGH (ref <5.7)
Mean Plasma Glucose: 143 mg/dL — ABNORMAL HIGH (ref <117)

## 2014-04-12 LAB — BASIC METABOLIC PANEL
BUN: 35 mg/dL — ABNORMAL HIGH (ref 6–23)
CO2: 23 mEq/L (ref 19–32)
Calcium: 9.3 mg/dL (ref 8.4–10.5)
Chloride: 101 mEq/L (ref 96–112)
Creat: 1.93 mg/dL — ABNORMAL HIGH (ref 0.50–1.35)
Glucose, Bld: 102 mg/dL — ABNORMAL HIGH (ref 70–99)
POTASSIUM: 4.6 meq/L (ref 3.5–5.3)
SODIUM: 132 meq/L — AB (ref 135–145)

## 2014-04-12 LAB — C-REACTIVE PROTEIN: CRP: 0.7 mg/dL — ABNORMAL HIGH (ref <0.60)

## 2014-04-12 LAB — CK: Total CK: 190 U/L (ref 7–232)

## 2014-04-12 LAB — MAGNESIUM: MAGNESIUM: 1.9 mg/dL (ref 1.5–2.5)

## 2014-04-12 LAB — SEDIMENTATION RATE: Sed Rate: 28 mm/hr — ABNORMAL HIGH (ref 0–16)

## 2014-04-13 DIAGNOSIS — E119 Type 2 diabetes mellitus without complications: Secondary | ICD-10-CM | POA: Insufficient documentation

## 2014-04-13 DIAGNOSIS — Z79899 Other long term (current) drug therapy: Secondary | ICD-10-CM

## 2014-04-13 DIAGNOSIS — N529 Male erectile dysfunction, unspecified: Secondary | ICD-10-CM | POA: Insufficient documentation

## 2014-04-13 DIAGNOSIS — I7300 Raynaud's syndrome without gangrene: Secondary | ICD-10-CM

## 2014-04-13 HISTORY — DX: Other long term (current) drug therapy: Z79.899

## 2014-04-13 HISTORY — DX: Male erectile dysfunction, unspecified: N52.9

## 2014-04-13 HISTORY — DX: Raynaud's syndrome without gangrene: I73.00

## 2014-04-14 LAB — ANA: ANA: NEGATIVE

## 2014-04-15 ENCOUNTER — Ambulatory Visit (INDEPENDENT_AMBULATORY_CARE_PROVIDER_SITE_OTHER): Payer: 59 | Admitting: Family Medicine

## 2014-04-15 VITALS — BP 134/70 | HR 50 | Temp 97.5°F | Ht 66.5 in | Wt 224.6 lb

## 2014-04-15 DIAGNOSIS — N289 Disorder of kidney and ureter, unspecified: Secondary | ICD-10-CM

## 2014-04-15 DIAGNOSIS — E119 Type 2 diabetes mellitus without complications: Secondary | ICD-10-CM

## 2014-04-15 LAB — POCT URINALYSIS DIPSTICK
Bilirubin, UA: NEGATIVE
GLUCOSE UA: NEGATIVE
Ketones, UA: NEGATIVE
Leukocytes, UA: NEGATIVE
Nitrite, UA: NEGATIVE
Protein, UA: NEGATIVE
RBC UA: NEGATIVE
Urobilinogen, UA: 0.2
pH, UA: 5.5

## 2014-04-15 NOTE — Patient Instructions (Signed)
Your urine looks fine.  I am so sorry for the confusion today!  Please go to the [facility] at [id number] [place].   Your most recent A1c does give you a diagnosis of diabetes.  However you do not need medication at this time.  Please continue your excellent diet and weight loss efforts.    I will let you know as soon as I get your results back

## 2014-04-15 NOTE — Progress Notes (Signed)
[facility] [address], [place] [address] [phone]  Date:  04/15/2014   Name:  Troy Obrien Laguna   DOB:  Jun 13, 1951   MRN:  [id number]  PCP:  Norberto SorensonSHAW,EVA, MD    Chief Complaint: Follow-up   History of Present Illness:  Troy Obrien Suell is a 63 y.o. very pleasant male patient who presents with the following:  Here to recheck his recnetly dx ARF.  His creat went from 1.5 to 1.93 from May to June of this year.  A1c has continued to look ok at 6.6%.   He has held his hyzaar, and is feeling ok.  He stopped his hyzaar 2 days ago.   He has lost about 20lbs recently through a diet which sounds balanced and reasonable.   Wt Readings from Last 3 Encounters:  04/15/14 224 lb 9.6 oz (101.878 kg)  04/11/14 223 lb 6.4 oz (101.334 kg)  03/26/14 221 lb 6.4 oz (100.426 kg)      Patient Active Problem List   Diagnosis Date Noted  . Encounter for long-term (current) use of other medications 04/13/2014  . Raynaud phenomenon 04/13/2014  . Erectile dysfunction 04/13/2014  . Type II or unspecified type diabetes mellitus without mention of complication, not stated as uncontrolled 04/13/2014  . Thoracic back pain 03/26/2014  . Right groin pain 02/20/2013  . Arthritis of hand 07/08/2012  . Fatigue 07/08/2012  . Sleep apnea 07/08/2012  . Hernia 07/08/2012  . Inguinal hernia, right 03/30/2012  . Right upper quadrant pain 03/30/2012  . Back pain 03/30/2012  . Hypertension 02/02/2012  . Glaucoma 02/02/2012    Past Medical History  Diagnosis Date  . Headache(784.0)   . Hypertension   . Hyperlipidemia   . Arthritis   . Inguinal hernia   . Gout   . Glaucoma     Past Surgical History  Procedure Laterality Date  . Circumcision  1972  . Tonsillectomy  1963  . Carpel tunnel      bilateral  . Inguinal hernia repair  10/11/2012    Procedure: HERNIA REPAIR INGUINAL ADULT;  Surgeon: Robyne AskewPaul S Toth III, MD;  [facility];  Service: General;  Laterality: Right;  .  Insertion of mesh  10/11/2012    Procedure: INSERTION OF MESH;  Surgeon: Robyne AskewPaul S Toth III, MD;  [facility];  Service: General;  Laterality: Right;    History  Substance Use Topics  . Smoking status: Never Smoker   . Smokeless tobacco: Never Used  . Alcohol Use: 0.0 oz/week     Comment: rare    No family history on file.  Allergies  Allergen Reactions  . Laxative [Bisacodyl] Hives  . Cyclobenzaprine Other (See Comments)    Hand twitching/spasms    Medication list has been reviewed and updated.  Current Outpatient Prescriptions on File Prior to Visit  Medication Sig Dispense Refill  . aspirin 81 MG tablet Take 81 mg by mouth daily.      . bimatoprost (LUMIGAN) 0.01 % SOLN Place 1 drop into both eyes at bedtime.      . brimonidine-timolol (COMBIGAN) 0.2-0.5 % ophthalmic solution Place 1 drop into both eyes 2 (two) times daily.       . butalbital-acetaminophen-caffeine (FIORICET, ESGIC) 50-325-40 MG per tablet       . CIALIS 20 MG tablet TAKE 1/2 TO 1 TABLET BY MOUTH 2 HOURS PRIOR TO ANTICIPATED INTERCOURSE  30 tablet  5  . ezetimibe (ZETIA) 10 MG tablet Take  1 tablet (10 mg total) by mouth daily. NEED VISIT, LABS!!!  30 tablet  5  . HYDROcodone-acetaminophen (NORCO) 10-325 MG per tablet Take 1 tablet by mouth every 6 (six) hours as needed.  60 tablet  0  . meloxicam (MOBIC) 15 MG tablet One daily for pain and inflammation  30 tablet  5  . zoster vaccine live, PF, (ZOSTAVAX) [id number] UNT/0.65ML injection Inject 19,400 Units into the skin once.  1 each  0  . latanoprost (XALATAN) 0.005 % ophthalmic solution Place 1 drop into both eyes at bedtime.       Marland Kitchen. losartan-hydrochlorothiazide (HYZAAR) 100-25 MG per tablet TAKE 1 TABLET BY MOUTH EVERY EVENING  30 tablet  0   No current facility-administered medications on file prior to visit.    Review of Systems:  As per HPI- otherwise negative.   Physical Examination: Filed Vitals:   04/15/14 1914  BP: 134/70  Pulse: 50  Temp:  97.5 F (36.4 C)   Filed Vitals:   04/15/14 1914  Height: 5' 6.5" (1.689 Obrien)  Weight: 224 lb 9.6 oz (101.878 kg)   Body mass index is 35.71 kg/(Obrien^2). Ideal Body Weight: Weight in (lb) to have BMI = 25: 156.9  GEN: WDWN, NAD, Non-toxic, A & O x 3, overweight, looks well HEENT: Atraumatic, Normocephalic. Neck supple. No masses, No LAD. Ears and Nose: No external deformity. CV: RRR, No Obrien/G/R. No JVD. No thrill. No extra heart sounds. PULM: CTA B, no wheezes, crackles, rhonchi. No retractions. No resp. distress. No accessory muscle use. EXTR: No c/c/e NEURO Normal gait.  PSYCH: Normally interactive. Conversant. Not depressed or anxious appearing.  Calm demeanor.   Results for orders placed in visit on 04/15/14  POCT URINALYSIS DIPSTICK      Result Value Ref Range   Color, UA yellow     Clarity, UA clear     Glucose, UA neg     Bilirubin, UA neg     Ketones, UA neg     Spec Grav, UA <=1.005     Blood, UA neg     pH, UA 5.5     Protein, UA neg     Urobilinogen, UA 0.2     Nitrite, UA neg     Leukocytes, UA Negative      Assessment and Plan: Acute renal insufficiency - Plan: POCT urinalysis dipstick, Basic metabolic panel  Type II or unspecified type diabetes mellitus without mention of complication, not stated as uncontrolled  He was not aware of his recent dx of diabetes yet.  Went over this in detail.  Right now he is working on losing weigh.  Keep this up and no need for medication at this time.   Recent onset of ARI.  Tried several x to draw bloood but no success today. He will follow-up at [facility] tomorrow  Signed Abbe AmsterdamJessica Copland, MD

## 2014-04-16 ENCOUNTER — Other Ambulatory Visit: Payer: Self-pay | Admitting: Family Medicine

## 2014-04-16 LAB — BASIC METABOLIC PANEL
Anion Gap: 8 (ref 7–16)
BUN: 27 mg/dL — AB (ref 7–18)
CREATININE: 1.63 mg/dL — AB (ref 0.60–1.30)
Calcium, Total: 8.8 mg/dL (ref 8.5–10.1)
Chloride: 102 mmol/L (ref 98–107)
Co2: 24 mmol/L (ref 21–32)
GFR CALC AF AMER: 52 — AB
GFR CALC NON AF AMER: 45 — AB
GLUCOSE: 78 mg/dL (ref 65–99)
Osmolality: 272 (ref 275–301)
POTASSIUM: 4.9 mmol/L (ref 3.5–5.1)
Sodium: 134 mmol/L — ABNORMAL LOW (ref 136–145)

## 2014-04-18 ENCOUNTER — Telehealth: Payer: Self-pay

## 2014-04-18 NOTE — Addendum Note (Signed)
Addended by: Norberto SorensonSHAW, Ashli Selders on: 04/18/2014 01:09 PM   Modules accepted: Orders

## 2014-04-18 NOTE — Telephone Encounter (Signed)
Advised pt- he agrees with plan

## 2014-04-18 NOTE — Telephone Encounter (Signed)
Pt was calling ahead to go ahead and get refill.  He stated that he was not out and he will be out on mon on tuesday

## 2014-04-18 NOTE — Telephone Encounter (Signed)
Pt can pick up refill on Sunday or after then. He will need an OV for any additional refills.

## 2014-04-18 NOTE — Telephone Encounter (Signed)
Patient calling to get a refill on pain medication for hydrocodone. He insist that Dr. Clelia CroftShaw is going to refill it today. I told him I will let her know.  I called after we hung up and spoke to [place]; she and Dr. Clelia CroftShaw agreed that he needs another office visit because he should have taken as needed and if taking more than ten tablets- a higher dose than directed by provider. Patient got medication on the 04/11/14. 60 tablets.  Please advise patient what to do. I told him that someone will call him back if the prescription was ready for pick up.   [phone]

## 2014-04-18 NOTE — Telephone Encounter (Signed)
Patient called to get a refill on pain medication hydrocodone. I told him I will put in a message and let dr. Clelia CroftShaw know. Patient insist that I call Dr. Clelia CroftShaw to let her know he called, he was told he would get it refilled today by her.  Patient is out and needs refilled. Was told after I hung up with patient that he will need an office visit for this refill because he received this on 04/11/14 for 60 tablets. He is currently all out.  Please call and advise patient. Thank you  [phone]

## 2014-04-18 NOTE — Telephone Encounter (Signed)
Also, we should go ahead and get started on a referral to orthopedic surgery for his shoulder and back pain - I think he definitely needs a second opinion with as much pain as he is having with this. Referral placed.

## 2014-04-19 ENCOUNTER — Other Ambulatory Visit: Payer: Self-pay | Admitting: Family Medicine

## 2014-04-19 ENCOUNTER — Telehealth: Payer: Self-pay | Admitting: Family Medicine

## 2014-04-19 DIAGNOSIS — I10 Essential (primary) hypertension: Secondary | ICD-10-CM

## 2014-04-19 MED ORDER — HYDROCODONE-ACETAMINOPHEN 10-325 MG PO TABS: 1.0000 | ORAL_TABLET | Freq: Four times a day (QID) | ORAL | Status: DC | PRN

## 2014-04-19 NOTE — Telephone Encounter (Signed)
[facility] last Wednesday - 6/18. Results not yet in chart. Has pt gone? If so can they fax the labs? I am out of the office this wk but will be checking Epic daily and recommend pt f/u with Dr. Patsy Lageropland (who ordered the bmp) if he needs to see a Riddick Nuon this week. ES

## 2014-04-21 NOTE — Telephone Encounter (Signed)
Spoke with the patient and the main lab at [facility]. They use [facility], not [facility].  Will fax us the results for Dr. Patsy Lageropland to review. BMP was done 04/16/2014.

## 2014-04-21 NOTE — Telephone Encounter (Signed)
Have been waiting on his BMP.  Called [facility] yesterday- it appears they do not have his blood.  Called him back today and he reports he did for sure go to [facility] and they drew his blood

## 2014-04-21 NOTE — Telephone Encounter (Signed)
Lm for rtn call 

## 2014-04-21 NOTE — Telephone Encounter (Signed)
Received labs from [facility].  His creatinine does look better- now at 1.63.LMOM.  I will try him back to discuss

## 2014-04-22 MED ORDER — LOSARTAN POTASSIUM 25 MG PO TABS: ORAL_TABLET | ORAL | Status: DC

## 2014-04-22 NOTE — Telephone Encounter (Signed)
Called him and was able to reach.  Will continue to keep him off HCTZ as his creatinine is now better, will restart a lower dose of plain losartan at 25mg  a day.  Sent in rx to his drug store.   He will come and see us in about 2 weeks for a recheck- will make an appt   Meds ordered this encounter  Medications  . losartan (COZAAR) 25 MG tablet    Sig: Start with 1 tablet daily.  May increase as directed    Dispense:  60 tablet    Refill:  3

## 2014-04-28 ENCOUNTER — Other Ambulatory Visit: Payer: Self-pay | Admitting: Family Medicine

## 2014-04-28 ENCOUNTER — Telehealth: Payer: Self-pay | Admitting: Family Medicine

## 2014-04-28 NOTE — Telephone Encounter (Signed)
Spoke to pt, he was concerned with restarting his BP medication as indicated by Dr. Patsy Lageropland since Dr. Clelia CroftShaw d/c this medication.   I assured him Dr. Patsy Lageropland has reviewed the lab results and feels this is in his best interest.  Pt understood, and will begin medication.

## 2014-05-01 ENCOUNTER — Encounter: Payer: Self-pay | Admitting: Family Medicine

## 2014-05-02 ENCOUNTER — Ambulatory Visit (INDEPENDENT_AMBULATORY_CARE_PROVIDER_SITE_OTHER): Payer: 59 | Admitting: Family Medicine

## 2014-05-02 VITALS — BP 122/82 | HR 54 | Temp 97.8°F | Resp 16 | Ht 67.0 in | Wt 224.8 lb

## 2014-05-02 DIAGNOSIS — N289 Disorder of kidney and ureter, unspecified: Secondary | ICD-10-CM

## 2014-05-02 DIAGNOSIS — R1319 Other dysphagia: Secondary | ICD-10-CM

## 2014-05-02 DIAGNOSIS — N529 Male erectile dysfunction, unspecified: Secondary | ICD-10-CM

## 2014-05-02 DIAGNOSIS — B370 Candidal stomatitis: Secondary | ICD-10-CM

## 2014-05-02 DIAGNOSIS — J029 Acute pharyngitis, unspecified: Secondary | ICD-10-CM

## 2014-05-02 LAB — POCT SKIN KOH: Skin KOH, POC: POSITIVE

## 2014-05-02 LAB — POCT RAPID STREP A (OFFICE): RAPID STREP A SCREEN: NEGATIVE

## 2014-05-02 MED ORDER — SILDENAFIL CITRATE 20 MG PO TABS: ORAL_TABLET | ORAL | Status: DC

## 2014-05-02 MED ORDER — CLOTRIMAZOLE 10 MG MT TROC: 10.0000 mg | Freq: Every day | OROMUCOSAL | Status: DC

## 2014-05-02 NOTE — Progress Notes (Addendum)
[facility] [address], [place] [address] [phone]  Date:  05/02/2014   Name:  Troy Obrien   DOB:  September 22, 1951   MRN:  [id number]  PCP:  Norberto SorensonSHAW,EVA, MD    Chief Complaint: Sore Throat, Oral Pain and sore place on lip   History of Present Illness:  Troy Obrien is a 63 y.o. very pleasant male patient who presents with the following:  Here today with complaint of mouth and throat pain. He notes a ST for about a week.   He has pain when he swallows, and feels like food is catching when he swallows. He sometimes has to cough food back up over the last week.  Liquids seem to go down fairly easily, but maybe not as well as usual.   Also he notes tender places on the roof of his mouth for about a week,  He cannot chew hard foods.    Also, last night he noted a small sore on his bottom lip.   He continues to work on his diet, and hopes that his blood sugar is getting better.  He was recently dx with mild DM  Otherwise he has felt ok, no fever.   No recent ABX use.  He does use cialis- just breaks off a part of the 20mg  tabs- as needed for ED.  However this always seem to cause a HA.  He would like to try something different to see if it might be easier for him to tolerate.    Patient Active Problem List   Diagnosis Date Noted  . Encounter for long-term (current) use of other medications 04/13/2014  . Raynaud phenomenon 04/13/2014  . Erectile dysfunction 04/13/2014  . Type II or unspecified type diabetes mellitus without mention of complication, not stated as uncontrolled 04/13/2014  . Thoracic back pain 03/26/2014  . Right groin pain 02/20/2013  . Arthritis of hand 07/08/2012  . Fatigue 07/08/2012  . Sleep apnea 07/08/2012  . Hernia 07/08/2012  . Inguinal hernia, right 03/30/2012  . Right upper quadrant pain 03/30/2012  . Back pain 03/30/2012  . Hypertension 02/02/2012  . Glaucoma 02/02/2012    Past Medical History  Diagnosis Date  .  Headache(784.0)   . Hypertension   . Hyperlipidemia   . Arthritis   . Inguinal hernia   . Gout   . Glaucoma     Past Surgical History  Procedure Laterality Date  . Circumcision  1972  . Tonsillectomy  1963  . Carpel tunnel      bilateral  . Inguinal hernia repair  10/11/2012    Procedure: HERNIA REPAIR INGUINAL ADULT;  Surgeon: Robyne AskewPaul S Toth III, MD;  [facility];  Service: General;  Laterality: Right;  . Insertion of mesh  10/11/2012    Procedure: INSERTION OF MESH;  Surgeon: Robyne AskewPaul S Toth III, MD;  [facility];  Service: General;  Laterality: Right;    History  Substance Use Topics  . Smoking status: Never Smoker   . Smokeless tobacco: Never Used  . Alcohol Use: 0.0 oz/week     Comment: rare    History reviewed. No pertinent family history.  Allergies  Allergen Reactions  . Laxative [Bisacodyl] Hives  . Cyclobenzaprine Other (See Comments)    Hand twitching/spasms    Medication list has been reviewed and updated.  Current Outpatient Prescriptions on File Prior to Visit  Medication Sig Dispense Refill  . aspirin 81 MG tablet Take 81 mg by  mouth daily.      . bimatoprost (LUMIGAN) 0.01 % SOLN Place 1 drop into both eyes at bedtime.      . brimonidine-timolol (COMBIGAN) 0.2-0.5 % ophthalmic solution Place 1 drop into both eyes 2 (two) times daily.       . butalbital-acetaminophen-caffeine (FIORICET, ESGIC) 50-325-40 MG per tablet       . CIALIS 20 MG tablet TAKE 1/2 TO 1 TABLET BY MOUTH 2 HOURS PRIOR TO ANTICIPATED INTERCOURSE  30 tablet  5  . ezetimibe (ZETIA) 10 MG tablet Take 1 tablet (10 mg total) by mouth daily. NEED VISIT, LABS!!!  30 tablet  5  . HYDROcodone-acetaminophen (NORCO) 10-325 MG per tablet Take 1 tablet by mouth every 6 (six) hours as needed.  60 tablet  0  . latanoprost (XALATAN) 0.005 % ophthalmic solution Place 1 drop into both eyes at bedtime.       Marland Kitchen losartan (COZAAR) 25 MG tablet Start with 1 tablet daily.  May increase as directed  60  tablet  3  . meloxicam (MOBIC) 15 MG tablet TAKE 1 TABLET BY MOUTH EVERY DAY FOR PAIN AND INFLAMMATION  30 tablet  5  . zoster vaccine live, PF, (ZOSTAVAX) 16109 UNT/0.65ML injection Inject 19,400 Units into the skin once.  1 each  0   No current facility-administered medications on file prior to visit.    Review of Systems:  As per HPI- otherwise negative.   Physical Examination: Filed Vitals:   05/02/14 1726  BP: 122/82  Pulse: 48  Temp: 97.8 F (36.6 C)  Resp: 16   Filed Vitals:   05/02/14 1726  Height: 5\' 7"  (1.702 m)  Weight: 224 lb 12.8 oz (101.969 kg)   Body mass index is 35.2 kg/(m^2). Ideal Body Weight: Weight in (lb) to have BMI = 25: 159.3  GEN: WDWN, NAD, Non-toxic, A & O x 3, overweight, looks well HEENT: Atraumatic, Normocephalic. Neck supple. No masses, No LAD.  Bilateral TM wnl, oropharynx normal.  PEERL,EOMI.   No visible thrush. He does have a few petechiae on the hard palate Ears and Nose: No external deformity. CV: RRR, No M/G/R. No JVD. No thrill. No extra heart sounds. PULM: CTA B, no wheezes, crackles, rhonchi. No retractions. No resp. distress. No accessory muscle use. EXTR: No c/c/e NEURO Normal gait.  PSYCH: Normally interactive. Conversant. Not depressed or anxious appearing.  Calm demeanor.   Results for orders placed in visit on 05/02/14  POCT SKIN KOH      Result Value Ref Range   Skin KOH, POC Positive    POCT RAPID STREP A (OFFICE)      Result Value Ref Range   Rapid Strep A Screen Negative  Negative    Assessment and Plan: Acute pharyngitis, unspecified pharyngitis type - Plan: POCT Skin KOH, POCT rapid strep A, Throat culture Loney Loh)  Other dysphagia - Plan: Throat culture Loney Loh)  Oral thrush - Plan: clotrimazole (MYCELEX) 10 MG troche, HIV antibody  Erectile dysfunction, unspecified erectile dysfunction type - Plan: sildenafil (REVATIO) 20 MG tablet  Acute renal insufficiency - Plan: Basic metabolic panel  Positive KOH  does suggest thrush.  Will treat with mycelex troches.  Explained that we need to rule- out HIV although this is unlikely he is ok with this. Also will follow-up on his creatnine.  Close follow-up planned.  If dysphagia does not resolve will refer to GI.   He will try generic viagra 20 mg for his ED- perhaps will have fever SE.  Meds ordered this encounter  Medications  . clotrimazole (MYCELEX) 10 MG troche    Sig: Take 1 tablet (10 mg total) by mouth 5 (five) times daily. Take for 2 weeks for oral thrush.    Dispense:  70 tablet    Refill:  0  . sildenafil (REVATIO) 20 MG tablet    Sig: Take 1, 2, or 3 tablets daily as needed for ED    Dispense:  30 tablet    Refill:  0   Signed Abbe AmsterdamJessica Laurencia Roma, MD  Called to check on him on 7/6: all labs are ok.  His throat is a little bit better, but not that much. He will give me a call if not making goof progress in the next couple of days and I can refer to ENT or GI

## 2014-05-02 NOTE — Patient Instructions (Signed)
I will be in touch with your labs asap.  Use the lozenges 5x a day for 2 weeks. If this does not help your situation I will refer you to GI  I sent in some generic viagra for you to try. Try 1 pill first, but you can take 2 or 3 if needed

## 2014-05-03 LAB — BASIC METABOLIC PANEL
BUN: 18 mg/dL (ref 6–23)
CHLORIDE: 103 meq/L (ref 96–112)
CO2: 24 mEq/L (ref 19–32)
Calcium: 9.4 mg/dL (ref 8.4–10.5)
Creat: 1.35 mg/dL (ref 0.50–1.35)
Glucose, Bld: 79 mg/dL (ref 70–99)
POTASSIUM: 4.3 meq/L (ref 3.5–5.3)
Sodium: 135 mEq/L (ref 135–145)

## 2014-05-03 LAB — HIV ANTIBODY (ROUTINE TESTING W REFLEX): HIV: NONREACTIVE

## 2014-05-05 LAB — CULTURE, GROUP A STREP: ORGANISM ID, BACTERIA: NORMAL

## 2014-05-07 ENCOUNTER — Other Ambulatory Visit: Payer: Self-pay | Admitting: Family Medicine

## 2014-05-10 ENCOUNTER — Ambulatory Visit (INDEPENDENT_AMBULATORY_CARE_PROVIDER_SITE_OTHER): Payer: 59 | Admitting: Family Medicine

## 2014-05-10 VITALS — BP 112/66 | HR 52 | Temp 97.2°F | Ht 66.0 in | Wt 223.0 lb

## 2014-05-10 DIAGNOSIS — N401 Enlarged prostate with lower urinary tract symptoms: Secondary | ICD-10-CM

## 2014-05-10 DIAGNOSIS — H409 Unspecified glaucoma: Secondary | ICD-10-CM

## 2014-05-10 DIAGNOSIS — Z79899 Other long term (current) drug therapy: Secondary | ICD-10-CM

## 2014-05-10 DIAGNOSIS — R200 Anesthesia of skin: Secondary | ICD-10-CM

## 2014-05-10 DIAGNOSIS — M25511 Pain in right shoulder: Secondary | ICD-10-CM

## 2014-05-10 DIAGNOSIS — R351 Nocturia: Secondary | ICD-10-CM

## 2014-05-10 DIAGNOSIS — E785 Hyperlipidemia, unspecified: Secondary | ICD-10-CM

## 2014-05-10 DIAGNOSIS — B370 Candidal stomatitis: Secondary | ICD-10-CM

## 2014-05-10 DIAGNOSIS — M25519 Pain in unspecified shoulder: Secondary | ICD-10-CM

## 2014-05-10 DIAGNOSIS — N179 Acute kidney failure, unspecified: Secondary | ICD-10-CM

## 2014-05-10 DIAGNOSIS — K2100 Gastro-esophageal reflux disease with esophagitis, without bleeding: Secondary | ICD-10-CM

## 2014-05-10 DIAGNOSIS — R1310 Dysphagia, unspecified: Secondary | ICD-10-CM

## 2014-05-10 DIAGNOSIS — R202 Paresthesia of skin: Secondary | ICD-10-CM

## 2014-05-10 DIAGNOSIS — N138 Other obstructive and reflux uropathy: Secondary | ICD-10-CM

## 2014-05-10 DIAGNOSIS — R209 Unspecified disturbances of skin sensation: Secondary | ICD-10-CM

## 2014-05-10 DIAGNOSIS — E119 Type 2 diabetes mellitus without complications: Secondary | ICD-10-CM

## 2014-05-10 DIAGNOSIS — I10 Essential (primary) hypertension: Secondary | ICD-10-CM

## 2014-05-10 DIAGNOSIS — B3781 Candidal esophagitis: Secondary | ICD-10-CM

## 2014-05-10 HISTORY — DX: Nocturia: R35.1

## 2014-05-10 HISTORY — DX: Benign prostatic hyperplasia with lower urinary tract symptoms: N40.1

## 2014-05-10 MED ORDER — DOXAZOSIN MESYLATE 2 MG PO TABS: 2.0000 mg | ORAL_TABLET | Freq: Every day | ORAL | Status: DC

## 2014-05-10 MED ORDER — HYDROCODONE-ACETAMINOPHEN 10-325 MG PO TABS: 1.0000 | ORAL_TABLET | Freq: Four times a day (QID) | ORAL | Status: DC | PRN

## 2014-05-10 MED ORDER — NYSTATIN 100000 UNIT/ML MT SUSP: 5.0000 mL | Freq: Four times a day (QID) | OROMUCOSAL | Status: DC

## 2014-05-10 MED ORDER — OMEPRAZOLE 40 MG PO CPDR: 40.0000 mg | DELAYED_RELEASE_CAPSULE | Freq: Every day | ORAL | Status: DC

## 2014-05-10 NOTE — Patient Instructions (Addendum)
Your appointment with your opthalmologist is on 7/30 at 1 p.m.   Dr. Lottie DawsonBond [facility]. New Patient Appointments: [phone]  Dr. Lottie DawsonBond office directly is: [phone]  [facility], 6th floor  [facility] [address] [place], [place] [address]  [facility] - they have called you 3 times to schedule an appointment. Please call them back to do so. For refills of the hydrocodone, you will need to be seen.  If you are going to need this medication on a regular basis, then we should discuss chronic pain management or referral to a pain management physician. If your numbness and tingling worsens, we may need to consider getting a nerve study of your arm or an ultrasound of your arm.  Stop the losartan. Try the cardura at night instead. Check your blood pressure outside of the office twice a week.  If you continue to have any trouble swallowing, we will need to refer you to GI for evaluation so please let me know if this does not resolve within the next 1-2 weeks.

## 2014-05-10 NOTE — Progress Notes (Signed)
Subjective:  This chart was scribed for [name], MD by Modena Jansky, ED Scribe. This patient was seen in room 1 and the patient's care was started at 3:02 PM.   Patient ID: Troy Obrien, male    DOB: 11-23-1950, 63 y.o.   MRN: [id number] Chief Complaint  Patient presents with  . Medication Refill    pain medication  . Numbness    left arm  . Sore Throat    mucus, difficulty swallowing x 3 weeks    HPI HPI Comments: Troy Obrien is a 63 y.o. male who presents to the [facility] complaining of right arm numbness and tingling.  He states that there is numbness and tingling in his right arm.   He reports that the numbness is exacerbated by laying or putting weight on his arm. He states that the numbness radiates to his fingers and upper arm.  Pt reports a hx of a back injury. He states that he has persistent back pain.  Pt states that he has no problems with grasp.  Pt reports a sore throat. He states that he has difficulty swallowing.  He reports no hx of prostate medications.   Pt was referred by me to orthopaedic surgery, but pt has declined to reschedule with them 3 times.  Pt is scheduled to see ophthalmologist on May 29, 2014.  On 12/04/12, Pt was intermittently compliant to Zetia, prior to that non HL  Pt reports that he has a hx of heart problem and indigestion. He states that he has "just lived with it".  Past Medical History  Diagnosis Date  . Headache(784.0)   . Hypertension   . Hyperlipidemia   . Arthritis   . Inguinal hernia   . Gout   . Glaucoma    Current Outpatient Prescriptions on File Prior to Visit  Medication Sig Dispense Refill  . aspirin 81 MG tablet Take 81 mg by mouth daily.      . bimatoprost (LUMIGAN) 0.01 % SOLN Place 1 drop into both eyes at bedtime.      . brimonidine-timolol (COMBIGAN) 0.2-0.5 % ophthalmic solution Place 1 drop into both eyes 2 (two) times daily.       Marland Kitchen CIALIS 20 MG tablet TAKE 1/2 TO 1 TABLET  BY MOUTH 2 HOURS PRIOR TO ANTICIPATED INTERCOURSE  30 tablet  5  . clotrimazole (MYCELEX) 10 MG troche Take 1 tablet (10 mg total) by mouth 5 (five) times daily. Take for 2 weeks for oral thrush.  70 tablet  0  . HYDROcodone-acetaminophen (NORCO) 10-325 MG per tablet Take 1 tablet by mouth every 6 (six) hours as needed.  60 tablet  0  . losartan (COZAAR) 25 MG tablet Start with 1 tablet daily.  May increase as directed  60 tablet  3  . meloxicam (MOBIC) 15 MG tablet TAKE 1 TABLET BY MOUTH EVERY DAY FOR PAIN AND INFLAMMATION  30 tablet  5  . sildenafil (REVATIO) 20 MG tablet Take 1, 2, or 3 tablets daily as needed for ED  30 tablet  0  . ZETIA 10 MG tablet TAKE 1 TABLET EVERY DAY  30 tablet  5  . zoster vaccine live, PF, (ZOSTAVAX) 40981 UNT/0.65ML injection Inject 19,400 Units into the skin once.  1 each  0  . butalbital-acetaminophen-caffeine (FIORICET, ESGIC) 50-325-40 MG per tablet       . latanoprost (XALATAN) 0.005 % ophthalmic solution Place 1 drop into both eyes at bedtime.  No current facility-administered medications on file prior to visit.   Allergies  Allergen Reactions  . Laxative [Bisacodyl] Hives  . Cyclobenzaprine Other (See Comments)    Hand twitching/spasms     Review of Systems  HENT: Positive for sore throat.   Musculoskeletal: Positive for back pain.  Neurological: Positive for numbness.   BP 112/66  Pulse 52  Temp(Src) 97.2 F (36.2 C) (Oral)  Ht 5\' 6"  (1.676 m)  Wt 223 lb (101.152 kg)  BMI 36.01 kg/m2  SpO2 98%     Objective:   Physical Exam  Nursing note and vitals reviewed. Constitutional: He is oriented to person, place, and time. He appears well-developed and well-nourished.  Non-toxic appearance. No distress.  HENT:  Head: Normocephalic and atraumatic.  Mouth/Throat: No oropharyngeal exudate.  Eyes: Conjunctivae and EOM are normal.  Neck: Normal range of motion. Neck supple. No tracheal deviation present.  No meningismus.  Cardiovascular:  Normal rate, regular rhythm, normal heart sounds and intact distal pulses.   No murmur heard. +2 radial and ulner pulses. +2 brachial pulse   Pulmonary/Chest: Effort normal and breath sounds normal. No respiratory distress.  Abdominal: Soft.  Musculoskeletal: Normal range of motion. He exhibits no edema and no tenderness.  Right trapezius muscle spasms  Neurological: He is alert and oriented to person, place, and time. No cranial nerve deficit. He exhibits normal muscle tone. Coordination normal.  Normal grasp strength. Normal strength of thumb opposition. Normal wrist strength. Negative phalen's test   Skin: Skin is warm and dry.  Psychiatric: He has a normal mood and affect. His behavior is normal.          Assessment & Plan:   BPH associated with nocturia  Essential hypertension, benign  Other and unspecified hyperlipidemia  Encounter for long-term (current) use of other medications - Plan: Basic Metabolic Panel  Glaucoma  Pain in joint, shoulder region, right - Plan: CANCELED: Ambulatory referral to Orthopedic Surgery  Numbness and tingling of right upper extremity - Plan: CANCELED: Ambulatory referral to Orthopedic Surgery  Type II or unspecified type diabetes mellitus without mention of complication, not stated as uncontrolled  Dysphagia, unspecified(787.20)  Thrush of mouth and esophagus  Acute renal failure, unspecified acute renal failure type - Plan: Basic Metabolic Panel  Gastroesophageal reflux disease with esophagitis  Meds ordered this encounter  Medications  . HYDROcodone-acetaminophen (NORCO) 10-325 MG per tablet    Sig: Take 1 tablet by mouth every 6 (six) hours as needed.    Dispense:  60 tablet    Refill:  0  . nystatin (MYCOSTATIN) 100000 UNIT/ML suspension    Sig: Use as directed 5 mLs (500,000 Units total) in the mouth or throat 4 (four) times daily.    Dispense:  180 mL    Refill:  0  . doxazosin (CARDURA) 2 MG tablet    Sig: Take 1 tablet  (2 mg total) by mouth at bedtime.    Dispense:  30 tablet    Refill:  5  . omeprazole (PRILOSEC) 40 MG capsule    Sig: Take 1 capsule (40 mg total) by mouth daily.    Dispense:  30 capsule    Refill:  2    I personally performed the services described in this documentation, which was scribed in my presence. The recorded information has been reviewed and considered, and addended by me as needed.  [name], MD MPH     More than 40 minutes spent with pt in face to face consultation

## 2014-05-11 LAB — BASIC METABOLIC PANEL
BUN: 15 mg/dL (ref 6–23)
CHLORIDE: 104 meq/L (ref 96–112)
CO2: 24 mEq/L (ref 19–32)
Calcium: 9.1 mg/dL (ref 8.4–10.5)
Creat: 1.23 mg/dL (ref 0.50–1.35)
Glucose, Bld: 78 mg/dL (ref 70–99)
POTASSIUM: 4.2 meq/L (ref 3.5–5.3)
SODIUM: 134 meq/L — AB (ref 135–145)

## 2014-05-14 ENCOUNTER — Encounter: Payer: Self-pay | Admitting: Family Medicine

## 2014-05-28 ENCOUNTER — Telehealth: Payer: Self-pay

## 2014-05-28 NOTE — Telephone Encounter (Signed)
Patient requesting a refill on his "hydrocodone". Patient also has questions to what blood pressure medication he is suppose to be on. Patient stated he thought his BP was recently changed to a different medication. Patient uses [facility] on [address]. Please call patient back at [phone]

## 2014-05-29 DIAGNOSIS — H401113 Primary open-angle glaucoma, right eye, severe stage: Secondary | ICD-10-CM | POA: Insufficient documentation

## 2014-05-29 DIAGNOSIS — H401190 Primary open-angle glaucoma, unspecified eye, stage unspecified: Secondary | ICD-10-CM | POA: Insufficient documentation

## 2014-05-29 HISTORY — DX: Primary open-angle glaucoma, right eye, severe stage: H40.1113

## 2014-05-29 NOTE — Telephone Encounter (Signed)
LM pt BP medication is Cardura.  Pt needs Hydrocodone refill.

## 2014-05-30 ENCOUNTER — Telehealth: Payer: Self-pay | Admitting: *Deleted

## 2014-05-30 NOTE — Telephone Encounter (Signed)
I will refill it one time as long as pt has an orthopedic surgery appt. Has he called them to schedule yet? (see referrals tab.) If so, I will refill it once and expect 60 tabs to last 30d - last used 60 in 20d. If pt thinks he is going to keep needing pain medicine and isn't at a point where he is ready to wean off, I would like to refer him to pain management.  He is welcome to come in to discuss this if he would like.

## 2014-05-30 NOTE — Telephone Encounter (Signed)
Patient called have his hydrocodone RX refilled, I spoke to Dr Clelia CroftShaw and she the patient has to make an appointment to see the orthopedic surgeon before another refill. I told him what the doctor said and he suppose to make the appointment. Also, he thought [facility] was scheduling the appointment.

## 2014-06-01 ENCOUNTER — Other Ambulatory Visit: Payer: Self-pay | Admitting: Family Medicine

## 2014-06-01 NOTE — Telephone Encounter (Signed)
Pt notified by Georg Ruddleynthia Joyce

## 2014-06-03 ENCOUNTER — Telehealth: Payer: Self-pay

## 2014-06-03 NOTE — Telephone Encounter (Signed)
Needs his pain medication refilled please. He also wanted Dr. Clelia CroftShaw to know that he will be keeping his appt for physical therapy 06/09/14 at 6pm.

## 2014-06-04 NOTE — Telephone Encounter (Signed)
But has he scheduled his appt with orthopedics yet? See other phone note.

## 2014-06-04 NOTE — Telephone Encounter (Signed)
Pt has an appt with [facility] on Monday.  He needs a refill until this appt.

## 2014-06-05 NOTE — Telephone Encounter (Signed)
Waiting for rx

## 2014-06-05 NOTE — Telephone Encounter (Signed)
Will be in the office tomorrow to print out rx for pt to pick up - will leave rx at 102 on 8/7.

## 2014-06-06 MED ORDER — HYDROCODONE-ACETAMINOPHEN 10-325 MG PO TABS: 1.0000 | ORAL_TABLET | Freq: Two times a day (BID) | ORAL | Status: DC | PRN

## 2014-06-06 NOTE — Telephone Encounter (Signed)
Advised script ready to be picked up.  He understands to RTC for further refills.

## 2014-06-06 NOTE — Telephone Encounter (Signed)
Please advise rx ready. I will call pt when ready for pick up.

## 2014-06-06 NOTE — Telephone Encounter (Signed)
rx printed. Please tell pt that I said that he will need to be seen in office for any additional refills.

## 2014-06-23 ENCOUNTER — Ambulatory Visit (INDEPENDENT_AMBULATORY_CARE_PROVIDER_SITE_OTHER): Payer: 59 | Admitting: Family Medicine

## 2014-06-23 VITALS — BP 138/82 | HR 52 | Temp 97.6°F | Resp 16 | Ht 67.0 in | Wt 227.0 lb

## 2014-06-23 DIAGNOSIS — N400 Enlarged prostate without lower urinary tract symptoms: Secondary | ICD-10-CM

## 2014-06-23 DIAGNOSIS — M25519 Pain in unspecified shoulder: Secondary | ICD-10-CM

## 2014-06-23 DIAGNOSIS — I10 Essential (primary) hypertension: Secondary | ICD-10-CM

## 2014-06-23 DIAGNOSIS — J329 Chronic sinusitis, unspecified: Secondary | ICD-10-CM

## 2014-06-23 DIAGNOSIS — N529 Male erectile dysfunction, unspecified: Secondary | ICD-10-CM

## 2014-06-23 DIAGNOSIS — R0981 Nasal congestion: Secondary | ICD-10-CM

## 2014-06-23 DIAGNOSIS — M25511 Pain in right shoulder: Secondary | ICD-10-CM

## 2014-06-23 DIAGNOSIS — J3489 Other specified disorders of nose and nasal sinuses: Secondary | ICD-10-CM

## 2014-06-23 DIAGNOSIS — H409 Unspecified glaucoma: Secondary | ICD-10-CM

## 2014-06-23 MED ORDER — HYDROCODONE-ACETAMINOPHEN 10-325 MG PO TABS: 1.0000 | ORAL_TABLET | Freq: Four times a day (QID) | ORAL | Status: DC | PRN

## 2014-06-23 MED ORDER — SILDENAFIL CITRATE 20 MG PO TABS: 20.0000 mg | ORAL_TABLET | ORAL | Status: DC | PRN

## 2014-06-23 NOTE — Progress Notes (Signed)
Subjective:   This chart was scribed for [name], MD by Andrew Au, ED Scribe. This patient was seen in room 11 and the patient's care was started at 8:31 PM.   Patient ID: Troy Obrien, male    DOB: 1951-02-12, 63 y.o.   MRN: [id number]  HPI Chief Complaint  Patient presents with  . Medication Refill    Norco & Sildenafil   HPI Comments: Troy Obrien is a 63 y.o. male with h/o  Glaucoma and HTN and  who presents to the [facility] here for a medication refill regarding norco and sildenafil. 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 Pt is seeing a new physical therapist. Pt states he was being seen at [facility] but reports the machines were burning him. Pt has been seen at [facility] for the past 3 weeks. Pt reports pain is improving. Pt has been taking 4 tablet a day. Pt reports he takes medication as needed. Pt denies having an appointment with an orthopedist.   Pt also c/o sinus problems. Pt reports he has trouble breathing through his nose. Pt has tried multiple nasal sprays without relief.  Pt reports his urine has doing well. He reports he was taking the wrong medication. Pt was taking sildenafil for bladder incontinence.   Pt no longer takes [place] and has switched to lumigan.  Past Medical History  Diagnosis Date  . Headache(784.0)   . Hypertension   . Hyperlipidemia   . Arthritis   . Inguinal hernia   . Gout   . Glaucoma    Past Surgical History  Procedure Laterality Date  . Circumcision  1972  . Tonsillectomy  1963  . Carpel tunnel      bilateral  . Inguinal hernia repair  10/11/2012    Procedure: HERNIA REPAIR INGUINAL ADULT;  Surgeon: Robyne Askew, MD;  Location: [facility] OR;  Service: General;  Laterality: Right;  . Insertion of mesh  10/11/2012    Procedure: INSERTION OF MESH;  Surgeon: Robyne Askew, MD;  Location: [facility] OR;  Service: General;  Laterality: Right;    Prior to Admission medications   Medication Sig Start Date End Date Taking? Authorizing Provider  aspirin 81 MG tablet Take 81 mg by mouth daily.   Yes Historical Provider, MD  bimatoprost (LUMIGAN) 0.01 % SOLN Place 1 drop into both eyes at bedtime.   Yes Historical Provider, MD  CIALIS 20 MG tablet TAKE 1/2 TO 1 TABLET BY MOUTH 2 HOURS PRIOR TO ANTICIPATED INTERCOURSE   Yes Peyton Najjar, MD  doxazosin (CARDURA) 2 MG tablet Take 1 tablet (2 mg total) by mouth at bedtime. 05/10/14  Yes Sherren Mocha, MD  HYDROcodone-acetaminophen (NORCO) 10-325 MG per tablet Take 1 tablet by mouth every 12 (twelve) hours as needed for severe pain. NEEDS OFFICE VISIT FOR ADDITIONAL FILLS 06/06/14  Yes Sherren Mocha, MD  meloxicam (MOBIC) 15 MG tablet TAKE 1 TABLET BY MOUTH EVERY DAY FOR PAIN AND INFLAMMATION 04/28/14  Yes Heather M Marte, PA-C  sildenafil (REVATIO) 20 MG tablet TAKE 1-3 TABLETS DAILY AS NEEDED   Yes Sherren Mocha, MD  ZETIA 10 MG tablet TAKE 1 TABLET EVERY DAY   Yes Heather M Marte, PA-C  zoster vaccine live, PF, (ZOSTAVAX) 40981 UNT/0.65ML injection Inject 19,400 Units into the skin once. 10/28/13  Yes Sherren Mocha, MD  brimonidine-timolol [facility])  0.2-0.5 % ophthalmic solution Place 1 drop into both eyes 2 (two) times daily.     Historical Provider, MD  nystatin (MYCOSTATIN) 100000 UNIT/ML suspension Use as directed 5 mLs (500,000 Units total) in the mouth or throat 4 (four) times daily. 05/10/14   Sherren Mocha, MD  omeprazole (PRILOSEC) 40 MG capsule Take 1 capsule (40 mg total) by mouth daily. 05/10/14   Sherren Mocha, MD   Review of Systems  HENT: Positive for congestion.   Musculoskeletal: Positive for myalgias.   BP 138/82  Pulse 52  Temp(Src) 97.6 F (36.4 C) (Oral)  Resp 16  Ht  (1.702 m)  Wt 227 lb (102.967 kg)  BMI 35.55 kg/m2  SpO2 98%  Objective:   Physical Exam  Nursing note and vitals reviewed. Constitutional: He is oriented to person, place, and time. He appears well-developed  and well-nourished. No distress.  HENT:  Head: Normocephalic and atraumatic.  Eyes: Conjunctivae and EOM are normal.  Neck: Neck supple.  Cardiovascular: Normal rate, regular rhythm and normal heart sounds.  Exam reveals no gallop and no friction rub.   No murmur heard. Pulmonary/Chest: Effort normal and breath sounds normal. No respiratory distress. He has no wheezes. He has no rales. He exhibits no tenderness.  Musculoskeletal: Normal range of motion.  Neurological: He is alert and oriented to person, place, and time.  Skin: Skin is warm and dry.  Psychiatric: He has a normal mood and affect. His behavior is normal.    Assessment & Plan:   1. Chronic nasal congestion   2. Unspecified sinusitis (chronic)   3. Right shoulder pain   4. Glaucoma   5. Essential hypertension, benign   6. BPH (benign prostatic hyperplasia)   7. Erectile dysfunction, unspecified erectile dysfunction type    Meds ordered this encounter  Medications  . HYDROcodone-acetaminophen (NORCO) 10-325 MG per tablet    Sig: Take 1 tablet by mouth every 6 (six) hours as needed for severe pain. Ok to fill today.    Dispense:  120 tablet    Refill:  0    This is a 30d supply.  . sildenafil (REVATIO) 20 MG tablet    Sig: Take 1 tablet (20 mg total) by mouth as needed (1 hr prior to intercourse).    Dispense:  30 tablet    Refill:  5   [place] database for controlled substances reviewed. Pt has not received narcotics anywhere other than [facility] since late march   Pt has declined referral to orthopedist and pain management until 3 months.  Pt will f/u in 3-4 weeks.   Nasal steroids indicated secondary to glaucoma. decongestant indicated secondary to HTN. Pt will be referred to ENT.   I personally performed the services described in this documentation, which was scribed in my presence. The recorded information has been reviewed and considered, and addended by me as needed.  [name], MD MPH     Over 40 min spent in  face-to-face evaluation of and consultation with patient and coordination of care.

## 2014-06-23 NOTE — Patient Instructions (Signed)
I am hoping that your shoulder will get much better with your physical therapy in the next month and that at our next visit we can start tapering the pain medication.  If for some reason we have not seen the amount of improvement expected, I would encourage you to consider referral to orthopedic surgery to find out exactly what is going on with your arm and how much it can improve. We will likely need to do more imaging including MRIs of your shoulder, and possibly of your neck and back as well.  If you are going to just continue to need pain medicine, I would recommend that we refer you to a chronic pain management doctor ot better control your pain over the long term but this is certainly something we can discuss down the road only if it unfortunately becomes necessary. We definitely need to get your nose fixed since the usual medications are contraindicated in you - unfortunately, I suspect this may be surgery by ENT but I do think that you need to do it for your long term health.  [facility] Policy for Prescribing Controlled Substances (Revised 08/2012) 1. Prescriptions for controlled substances will be filled by ONE provider at [facility] with whom you have established and developed a plan for your care, including follow-up. 2. You are encouraged to schedule an appointment with your prescriber at our appointment center for follow-up visits whenever possible. 3. If you request a prescription for the controlled substance while at [facility] for an acute problem (with someone other than your regular prescriber), you MAY be given a ONE-TIME prescription for a 30-day supply of the controlled substance, to allow time for you to return to see your regular prescriber for additional prescriptions. 4. You may not receive controlled substances from any other provider. If there is an acute condition for which you are seen elsewhere (e.g. you go to the ER after a car accident), please alert the provider that you are on a controlled  substance contract and if you are still prescribed a controlled substance, you alert our office within 24 hours of filling it.   5. If you are planning to be seen at our [facility] walk-in clinic for refills, please call ahead of time to confirm that I will be working and please arrive 2 hours prior to the end of my shift to guarantee that you will be seen by myself.  If you are seen at our office for another reason or I am out of the office for an extended period, some providers may be willing to provide a refill on your medications but others are not. It is left up to each provider's discretion at the time of the visit.   6. Please treat our staff with the respect with which you would like to be treated. I know that our medical system can be frustrating but they are doing their job to the best of their abilities as they were instructed.

## 2014-06-23 NOTE — Progress Notes (Deleted)
Subjective:   This chart was scribed for Troy Sorenson, MD by Andrew Au, ED Scribe. This patient was seen in room 11 and the patient's care was started at 8:31 PM.   Patient ID: Troy Obrien, male    DOB: July 10, 1951, 63 y.o.   MRN: [id number]  HPI Chief Complaint  Patient presents with   Medication Refill    Norco & Sildenafil   HPI Comments: GREY RAKESTRAW is a 63 y.o. male with h/o  Glaucoma and HTN and  who presents to the [facility] here for a medication refill regarding norco and sildenafil. 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 Pt is seeing a new physical therapist. Pt states he was being seen at [facility] but reports the machines were burning him. Pt has been seen at [facility] for the past 3 weeks. Pt reports pain is improving. Pt has been taking 4 tablet a day. Pt reports he takes medication as needed. Pt denies having an appointment with an orthopedist.   Pt also c/o sinus problems. Pt reports he has trouble breathing through his nose. Pt has tried multiple nasal sprays without relief.  Pt reports his urine has doing well. He reports he was taking the wrong medication. Pt was taking sildenafil for bladder incontinence.   Pt no longer takes [place] and has switched to lumigan.  Past Medical History  Diagnosis Date   Headache(784.0)    Hypertension    Hyperlipidemia    Arthritis    Inguinal hernia    Gout    Glaucoma    Past Surgical History  Procedure Laterality Date   Circumcision  1972   Tonsillectomy  1963   Carpel tunnel      bilateral   Inguinal hernia repair  10/11/2012    Procedure: HERNIA REPAIR INGUINAL ADULT;  Surgeon: Robyne Askew, MD;  Location: [facility] OR;  Service: General;  Laterality: Right;   Insertion of mesh  10/11/2012    Procedure: INSERTION OF MESH;  Surgeon: Robyne Askew, MD;  Location: [facility] OR;  Service: General;  Laterality: Right;    Prior to Admission medications   Medication Sig Start Date End Date Taking? Authorizing Provider  aspirin 81 MG tablet Take 81 mg by mouth daily.   Yes Historical Provider, MD  bimatoprost (LUMIGAN) 0.01 % SOLN Place 1 drop into both eyes at bedtime.   Yes Historical Provider, MD  CIALIS 20 MG tablet TAKE 1/2 TO 1 TABLET BY MOUTH 2 HOURS PRIOR TO ANTICIPATED INTERCOURSE   Yes Peyton Najjar, MD  doxazosin (CARDURA) 2 MG tablet Take 1 tablet (2 mg total) by mouth at bedtime. 05/10/14  Yes Sherren Mocha, MD  HYDROcodone-acetaminophen (NORCO) 10-325 MG per tablet Take 1 tablet by mouth every 12 (twelve) hours as needed for severe pain. NEEDS OFFICE VISIT FOR ADDITIONAL FILLS 06/06/14  Yes Sherren Mocha, MD  meloxicam (MOBIC) 15 MG tablet TAKE 1 TABLET BY MOUTH EVERY DAY FOR PAIN AND INFLAMMATION 04/28/14  Yes Heather M Marte, PA-C  sildenafil (REVATIO) 20 MG tablet TAKE 1-3 TABLETS DAILY AS NEEDED   Yes Sherren Mocha, MD  ZETIA 10 MG tablet TAKE 1 TABLET EVERY DAY   Yes Heather M Marte, PA-C  zoster vaccine live, PF, (ZOSTAVAX) 82956 UNT/0.65ML injection Inject 19,400 Units into the skin once. 10/28/13  Yes Sherren Mocha, MD  brimonidine-timolol [facility])  0.2-0.5 % ophthalmic solution Place 1 drop into both eyes 2 (two) times daily.     Historical Provider, MD  nystatin (MYCOSTATIN) 100000 UNIT/ML suspension Use as directed 5 mLs (500,000 Units total) in the mouth or throat 4 (four) times daily. 05/10/14   Sherren Mocha, MD  omeprazole (PRILOSEC) 40 MG capsule Take 1 capsule (40 mg total) by mouth daily. 05/10/14   Sherren Mocha, MD   Review of Systems  HENT: Positive for congestion.   Musculoskeletal: Positive for myalgias.    Objective:   Physical Exam  Nursing note and vitals reviewed. Constitutional: He is oriented to person, place, and time. He appears well-developed and well-nourished. No distress.  HENT:  Head: Normocephalic and atraumatic.  Eyes: Conjunctivae and EOM are normal.  Neck: Neck supple.   Cardiovascular: Normal rate, regular rhythm and normal heart sounds.  Exam reveals no gallop and no friction rub.   No murmur heard. Pulmonary/Chest: Effort normal and breath sounds normal. No respiratory distress. He has no wheezes. He has no rales. He exhibits no tenderness.  Musculoskeletal: Normal range of motion.  Neurological: He is alert and oriented to person, place, and time.  Skin: Skin is warm and dry.  Psychiatric: He has a normal mood and affect. His behavior is normal.    Assessment & Plan:   1. Chronic nasal congestion   2. Unspecified sinusitis (chronic)   3. Right shoulder pain   4. Glaucoma   5. Essential hypertension, benign    Meds ordered this encounter  Medications   HYDROcodone-acetaminophen (NORCO) 10-325 MG per tablet    Sig: Take 1 tablet by mouth every 6 (six) hours as needed for severe pain. Ok to fill today.    Dispense:  120 tablet    Refill:  0    This is a 30d supply.    database for controlled substances reviewed. Pt has not received narcotics anywhere other than [facility] since late march   Pt has declined referral to orthopedist and pain management until 3 months.  Pt will f/u in 3-4 weeks.   Nasal steroids indicated secondary to glaucoma. decongestant indicated secondary to HTN. Pt will be referred to ENT.  I personally performed the services described in this documentation, which was scribed in my presence. The recorded information has been reviewed and is accurate.  Over 40 min spent in face-to-face evaluation of and consultation with patient and coordination of care.

## 2014-07-14 ENCOUNTER — Ambulatory Visit (INDEPENDENT_AMBULATORY_CARE_PROVIDER_SITE_OTHER): Payer: 59 | Admitting: Family Medicine

## 2014-07-14 VITALS — BP 140/78 | HR 53 | Temp 97.7°F | Resp 12 | Ht 66.75 in | Wt 225.5 lb

## 2014-07-14 DIAGNOSIS — H409 Unspecified glaucoma: Secondary | ICD-10-CM

## 2014-07-14 DIAGNOSIS — Z79899 Other long term (current) drug therapy: Secondary | ICD-10-CM

## 2014-07-14 DIAGNOSIS — M546 Pain in thoracic spine: Secondary | ICD-10-CM

## 2014-07-14 DIAGNOSIS — E119 Type 2 diabetes mellitus without complications: Secondary | ICD-10-CM

## 2014-07-14 DIAGNOSIS — J309 Allergic rhinitis, unspecified: Secondary | ICD-10-CM

## 2014-07-14 DIAGNOSIS — I10 Essential (primary) hypertension: Secondary | ICD-10-CM

## 2014-07-14 MED ORDER — LEVOCETIRIZINE DIHYDROCHLORIDE 5 MG PO TABS: 5.0000 mg | ORAL_TABLET | Freq: Every evening | ORAL | Status: DC

## 2014-07-14 MED ORDER — AVANAFIL 200 MG PO TABS: ORAL_TABLET | ORAL | Status: DC

## 2014-07-14 MED ORDER — MONTELUKAST SODIUM 10 MG PO TABS: 10.0000 mg | ORAL_TABLET | Freq: Every day | ORAL | Status: DC

## 2014-07-14 MED ORDER — HYDROCODONE-ACETAMINOPHEN 10-325 MG PO TABS: 1.0000 | ORAL_TABLET | Freq: Four times a day (QID) | ORAL | Status: DC | PRN

## 2014-07-15 NOTE — Progress Notes (Signed)
Subjective:  This chart was scribed for [name], MD, by Elon Spanner, ED Scribe. This patient was seen in Rm 14 and the patient's care was started at 7:13 PM.   Patient ID: Troy Obrien, male    DOB: September 04, 1951, 63 y.o.   MRN: [id number] Chief Complaint  Patient presents with  . Medication Refill    Norco  . Nasal Congestion    ? Referral    HPI HPI Comments: Troy Obrien is a 63 y.o. male who has been seen before for severe, chronic rhinitis and nasal congestion for many years.  Nasal steroids are contraindicated secondary to glaucoma.  Nasal decongestant contraindicated secondary to hypertension.  Recommend patient referrel to ENT on last visit, but this was never place.  He presents to [facility] with a similar complaint.  Patient also reports intermittent ear pain and sinus pressure. He states his complaint is exacerbated by increased busyness.  Patient reports he recently used OTC sudafed this past weekend because he was "playing several anniversary parties".   Patient denies sore throat, fever, cough.    Past Medical History  Diagnosis Date  . Headache(784.0)   . Hypertension   . Hyperlipidemia   . Arthritis   . Inguinal hernia   . Gout   . Glaucoma   Patient is a Museum/gallery exhibitions officer.   Current Outpatient Prescriptions on File Prior to Visit  Medication Sig Dispense Refill  . aspirin 81 MG tablet Take 81 mg by mouth daily.      . bimatoprost (LUMIGAN) 0.01 % SOLN Place 1 drop into both eyes at bedtime.      Marland Kitchen doxazosin (CARDURA) 2 MG tablet Take 1 tablet (2 mg total) by mouth at bedtime.  30 tablet  5  . meloxicam (MOBIC) 15 MG tablet TAKE 1 TABLET BY MOUTH EVERY DAY FOR PAIN AND INFLAMMATION  30 tablet  5  . sildenafil (REVATIO) 20 MG tablet Take 1 tablet (20 mg total) by mouth as needed (1 hr prior to intercourse).  30 tablet  5  . ZETIA 10 MG tablet TAKE 1 TABLET EVERY DAY  30 tablet  5   No current facility-administered medications on file prior to visit.    Allergies  Allergen Reactions  . Laxative [Bisacodyl] Hives  . Cyclobenzaprine Other (See Comments)    Hand twitching/spasms      Review of Systems  Constitutional: Positive for chills. Negative for fever.  HENT: Positive for congestion and ear pain. Negative for sore throat.   Respiratory: Negative for cough.        Objective:  BP 140/78  Pulse 53  Temp(Src) 97.7 F (36.5 C) (Oral)  Resp 12  Ht 5' 6.75" (1.695 m)  Wt 225 lb 8 oz (102.286 kg)  BMI 35.60 kg/m2  SpO2 97%  Physical Exam  Nursing note and vitals reviewed. Constitutional: He is oriented to person, place, and time. He appears well-developed and well-nourished. No distress.  HENT:  Head: Normocephalic and atraumatic.  Right Ear: Tympanic membrane normal.  Left Ear: Tympanic membrane normal.  Nose: Mucosal edema and rhinorrhea present.  Mouth/Throat: Oropharynx is clear and moist.  Severe mucosal edema and rhinorrhea.  Nasal passages are completely shut.    Eyes: Conjunctivae and EOM are normal.  Neck: Neck supple. No tracheal deviation present.  Normal thyroid.  Cardiovascular: Normal rate and regular rhythm.   Normal S1/S2  Pulmonary/Chest: Effort normal. No respiratory distress.  Musculoskeletal: Normal range of motion.  Lymphadenopathy:  He has no cervical adenopathy.  Neurological: He is alert and oriented to person, place, and time.  Skin: Skin is warm and dry.  Psychiatric: He has a normal mood and affect. His behavior is normal.          Assessment & Plan:  7:24 PM Advised patient to schedule a follow-up visit to perform labs   Advised patient to use Good-Rx website to compare rx prices.  Ok to see if either  generic viagra or stendra w/ coupon is cheaper.  Type II or unspecified type diabetes mellitus without mention of complication, not stated as uncontrolled  Right-sided thoracic back pain - now chronic in past 6 mos from amp with minimal help by PT - recommend ortho eval or  consider chronic pain managmement - pt will consider and we will discuss further at f/u  Essential hypertension  Glaucoma  Encounter for long-term (current) use of other medications  Allergic rhinitis, unspecified allergic rhinitis type - Plan: Ambulatory referral to ENT - very severe and causing constant mouth breathing as nares ALWAYs swollen shut bilaterally - worse now during allergy season so will give patient steroid shot.  Will prescribed singulair and xyzal.  Will proved referral to ENT.  Meds ordered this encounter  Medications  . HYDROcodone-acetaminophen (NORCO) 10-325 MG per tablet    Sig: Take 1 tablet by mouth every 6 (six) hours as needed for severe pain. Ok to fill today.    Dispense:  120 tablet    Refill:  0    This is a 30d supply.  . Avanafil (STENDRA) 200 MG TABS    Sig: Take 1/4 to 1 tablet by mouth 15-30 minutes before sexual activity. Do not use >1 tab in 24 hours    Dispense:  5 tablet    Refill:  11  . levocetirizine (XYZAL) 5 MG tablet    Sig: Take 1 tablet (5 mg total) by mouth every evening.    Dispense:  30 tablet    Refill:  3  . montelukast (SINGULAIR) 10 MG tablet    Sig: Take 1 tablet (10 mg total) by mouth at bedtime.    Dispense:  30 tablet    Refill:  3    I personally performed the services described in this documentation, which was scribed in my presence. The recorded information has been reviewed and considered, and addended by me as needed.  [name], MD MPH

## 2014-07-17 ENCOUNTER — Telehealth: Payer: Self-pay

## 2014-07-17 NOTE — Telephone Encounter (Signed)
Pt would like Dr. Clelia Croft to call him back. I'm unaware as to what this is regarding; he wouldn't give me any more information.

## 2014-07-19 NOTE — Telephone Encounter (Signed)
Yes, the script I gave him said ok to fill today but the prior one said that it was a 30d supply.  Ok to call pharmacy and give authorization for early fill.

## 2014-07-19 NOTE — Telephone Encounter (Signed)
Pt wants to know if he can get his Hydrocodone RF'ed early. Wouldn't talk to me about anything and wouldn't tell me why he ran out early. He's not due to RF it until 9/24. Please advise. Thanks

## 2014-07-20 NOTE — Telephone Encounter (Signed)
Called pharm and authorized RF. LMOM that I did this for him.

## 2014-07-22 NOTE — Progress Notes (Signed)
Patient has to be seen after 6 pm so he will come into the walk in clinic and see you around October 23.  I told him to call around that date and see if you are working in the evenings during that week.

## 2014-08-11 ENCOUNTER — Telehealth: Payer: Self-pay

## 2014-08-11 NOTE — Telephone Encounter (Signed)
Pt is needing to know if he is to stop taking the medication for his breathing until he sees dr Clelia Croftshaw on the 23rd he says he requested a call back on Friday but has not had it yet

## 2014-08-11 NOTE — Telephone Encounter (Signed)
Pt is going to [place] on Sunday after church. He wants to get his fasting labs completed at the same time.

## 2014-08-17 ENCOUNTER — Ambulatory Visit (INDEPENDENT_AMBULATORY_CARE_PROVIDER_SITE_OTHER): Payer: 59 | Admitting: Family Medicine

## 2014-08-17 VITALS — BP 156/88 | HR 48 | Temp 97.5°F | Resp 16 | Ht 66.5 in | Wt 223.6 lb

## 2014-08-17 DIAGNOSIS — E785 Hyperlipidemia, unspecified: Secondary | ICD-10-CM

## 2014-08-17 DIAGNOSIS — N529 Male erectile dysfunction, unspecified: Secondary | ICD-10-CM

## 2014-08-17 DIAGNOSIS — N400 Enlarged prostate without lower urinary tract symptoms: Secondary | ICD-10-CM

## 2014-08-17 DIAGNOSIS — I10 Essential (primary) hypertension: Secondary | ICD-10-CM

## 2014-08-17 DIAGNOSIS — E119 Type 2 diabetes mellitus without complications: Secondary | ICD-10-CM

## 2014-08-17 DIAGNOSIS — G894 Chronic pain syndrome: Secondary | ICD-10-CM

## 2014-08-17 LAB — POCT GLYCOSYLATED HEMOGLOBIN (HGB A1C): HEMOGLOBIN A1C: 6

## 2014-08-17 LAB — GLUCOSE, POCT (MANUAL RESULT ENTRY): POC Glucose: 93 mg/dl (ref 70–99)

## 2014-08-17 MED ORDER — HYDROCODONE-ACETAMINOPHEN 10-325 MG PO TABS: 1.0000 | ORAL_TABLET | ORAL | Status: DC | PRN

## 2014-08-17 MED ORDER — AVANAFIL 200 MG PO TABS: 100.0000 mg | ORAL_TABLET | ORAL | Status: DC | PRN

## 2014-08-17 MED ORDER — DOXAZOSIN MESYLATE 4 MG PO TABS: 4.0000 mg | ORAL_TABLET | Freq: Every day | ORAL | Status: DC

## 2014-08-17 NOTE — Progress Notes (Signed)
Subjective:    Patient ID: Troy Obrien, male    DOB: 1951/09/17, 63 y.o.   MRN: [id number] This chart was scribed for Troy SorensonEva Shaw, MD by Jolene Provostobert Halas, Medical Scribe. This patient was seen in Room 8 and the patient's care was started a 4:19 PM.   Chief Complaint  Patient presents with  . Follow-up    here for follow up with Dr. Clelia CroftShaw for chol check, DM check.      HPI Past Medical History  Diagnosis Date  . Headache(784.0)   . Hypertension   . Hyperlipidemia   . Arthritis   . Inguinal hernia   . Gout   . Glaucoma    Allergies  Allergen Reactions  . Laxative [Bisacodyl] Hives  . Cyclobenzaprine Other (See Comments)    Hand twitching/spasms   Current Outpatient Prescriptions on File Prior to Visit  Medication Sig Dispense Refill  . aspirin 81 MG tablet Take 81 mg by mouth daily.      . bimatoprost (LUMIGAN) 0.01 % SOLN Place 1 drop into both eyes at bedtime.      Marland Kitchen. doxazosin (CARDURA) 2 MG tablet Take 1 tablet (2 mg total) by mouth at bedtime.  30 tablet  5  . HYDROcodone-acetaminophen (NORCO) 10-325 MG per tablet Take 1 tablet by mouth every 6 (six) hours as needed for severe pain. Ok to fill today.  120 tablet  0  . meloxicam (MOBIC) 15 MG tablet TAKE 1 TABLET BY MOUTH EVERY DAY FOR PAIN AND INFLAMMATION  30 tablet  5  . ZETIA 10 MG tablet TAKE 1 TABLET EVERY DAY  30 tablet  5  . levocetirizine (XYZAL) 5 MG tablet Take 1 tablet (5 mg total) by mouth every evening.  30 tablet  3  . montelukast (SINGULAIR) 10 MG tablet Take 1 tablet (10 mg total) by mouth at bedtime.  30 tablet  3  . sildenafil (REVATIO) 20 MG tablet Take 1 tablet (20 mg total) by mouth as needed (1 hr prior to intercourse).  30 tablet  5   No current facility-administered medications on file prior to visit.   HPI Comments: Troy Farrierddie M Madkins is a 63 y.o. male who [facility] for a follow up. Pt states that he saw Dr. Asencion PartridgeBayers and was prescribed QNASL for his sinus symptoms. Pt states he has been  having severe HA since he has been taking the QNASL. Pt states that he is interested in another erectile dysfunction medication.    Review of Systems  HENT: Positive for congestion and sinus pressure.   Genitourinary:       Erectile dysfunction.  Musculoskeletal: Positive for back pain.  Neurological: Positive for headaches.       Objective:  BP 156/88  Pulse 48  Temp(Src) 97.5 F (36.4 C) (Oral)  Resp 16  Ht 5' 6.5" (1.689 m)  Wt 223 lb 9.6 oz (101.424 kg)  BMI 35.55 kg/m2  SpO2 97%  Physical Exam  Nursing note and vitals reviewed. Constitutional: He is oriented to person, place, and time. He appears well-developed and well-nourished.  HENT:  Head: Normocephalic and atraumatic.  Pale mucosal edema and rhinorrhea, both nares completely swollen shut with a slight polyp in right nare.   Eyes: Pupils are equal, round, and reactive to light.  Neck: No JVD present.  Cardiovascular: Normal rate and regular rhythm.   Pulmonary/Chest: Effort normal and breath sounds normal. No respiratory distress.  Neurological: He is alert and oriented to person, place,  and time.  Skin: Skin is warm and dry.  Psychiatric: He has a normal mood and affect. His behavior is normal.       Assessment & Plan:   Diabetes mellitus without complication - Plan: POCT glycosylated hemoglobin (Hb A1C), Lipid panel, Comprehensive metabolic panel, POCT glucose (manual entry) - has decreased a1c from 6.6 to 6.0 in past 4 mos through tlc - cont with exercise and weight loss.  Chronic pain syndrome - pt's right thoracic pain is much improved on the hydrocodone, plans to f/u w/ PT again for further treatments. Does not want to see ortho or proceed w/ MRI at this time. Reviewed requirements for chronic narcotic management at [facility] - pt will need to be seen in 3 mos for any refills and no early med fills.  If use continues to increase at all, refer to PT  Essential hypertension, benign - increase doxazosin from 2 mg  to 4mg  qhs  Benign prostatic hypertrophy - still with nocturia of 4x/night  Hyperlipidemia - well controlled, cont zetia  Erectile dysfunction, unspecified erectile dysfunction type - gave coupon for strendra  Meds ordered this encounter  Medications  . HYDROcodone-acetaminophen (NORCO) 10-325 MG per tablet    Sig: Take 1 tablet by mouth every 4 (four) hours as needed for severe pain. Ok to fill today.    Dispense:  150 tablet    Refill:  0    This is a 30d supply.  Marland Kitchen HYDROcodone-acetaminophen (NORCO) 10-325 MG per tablet    Sig: Take 1 tablet by mouth every 4 (four) hours as needed. May fill in 30 days from date written.    Dispense:  150 tablet    Refill:  0  . HYDROcodone-acetaminophen (NORCO) 10-325 MG per tablet    Sig: Take 1 tablet by mouth every 4 (four) hours as needed. May fill in 60 days from date written    Dispense:  150 tablet    Refill:  0  . Avanafil 200 MG TABS    Sig: Take 100-200 mg by mouth as needed. 15 minutes prior to sexual activity. Do not repeat dose within 24 hours    Dispense:  30 tablet    Refill:  5  . doxazosin (CARDURA) 4 MG tablet    Sig: Take 1 tablet (4 mg total) by mouth at bedtime.    Dispense:  30 tablet    Refill:  5  . ezetimibe (ZETIA) 10 MG tablet    Sig: TAKE 1 TABLET EVERY DAY    Dispense:  30 tablet    Refill:  5    I personally performed the services described in this documentation, which was scribed in my presence. The recorded information has been reviewed and considered, and addended by me as needed.  Troy Sorenson, MD MPH  Results for orders placed in visit on 08/17/14  LIPID PANEL      Result Value Ref Range   Cholesterol 168  0 - 200 mg/dL   Triglycerides 44  <161 mg/dL   HDL 65  >09 mg/dL   Total CHOL/HDL Ratio 2.6     VLDL 9  0 - 40 mg/dL   LDL Cholesterol 94  0 - 99 mg/dL  COMPREHENSIVE METABOLIC PANEL      Result Value Ref Range   Sodium 137  135 - 145 mEq/L   Potassium 4.6  3.5 - 5.3 mEq/L   Chloride 105  96 -  112 mEq/L   CO2 27  19 - 32  mEq/L   Glucose, Bld 85  70 - 99 mg/dL   BUN 17  6 - 23 mg/dL   Creat 9.141.18  7.820.50 - 9.561.35 mg/dL   Total Bilirubin 0.6  0.2 - 1.2 mg/dL   Alkaline Phosphatase 40  39 - 117 U/L   AST 17  0 - 37 U/L   ALT 15  0 - 53 U/L   Total Protein 7.6  6.0 - 8.3 g/dL   Albumin 4.3  3.5 - 5.2 g/dL   Calcium 9.2  8.4 - 21.310.5 mg/dL  POCT GLYCOSYLATED HEMOGLOBIN (HGB A1C)      Result Value Ref Range   Hemoglobin A1C 6.0    GLUCOSE, POCT (MANUAL RESULT ENTRY)      Result Value Ref Range   POC Glucose 93  70 - 99 mg/dl

## 2014-08-17 NOTE — Patient Instructions (Signed)
[facility] Policy for Prescribing Controlled Substances (Revised 08/2012) 1. Prescriptions for controlled substances will be filled by ONE provider at [facility] with whom you have established and developed a plan for your care, including follow-up. 2. You are encouraged to schedule an appointment with your prescriber at our appointment center for follow-up visits whenever possible. 3. If you request a prescription for the controlled substance while at [facility] for an acute problem (with someone other than your regular prescriber), you MAY be given a ONE-TIME prescription for a 30-day supply of the controlled substance, to allow time for you to return to see your regular prescriber for additional prescriptions. 1. You will need to be seen for an office visit every 3 months.  Hopefully, all prescriptions can be issued at that time so that you do not need to call in for refill requests 2.  If for some reason, you are not issued all of your prescriptions at a visit, please allow a 72 hour or 3 day window to respond to your refill requests. This is especially important if you need to pick up a prescription over the weekend, so make sure you call during the week prior (e.g. Call by Thursday if you want to be able to pick up a prescription by Sunday). 3. You may only fill your prescriptions at one pharmacy. If you are traveling out of town for some reason during a time that you will need a refill filled, I request that you notify our office and stick to one chain - such was [facility], [facility], [facility], etc. 4. You may not receive controlled substances from any other provider. If there is an acute condition for which you are seen elsewhere (e.g. you go to the ER after a car accident), please alert the provider that you are on a controlled substance contract and if you are still prescribed a controlled substance, you alert our office before filling it.   5. If you are planning to be seen at our [facility] walk-in clinic for refills, please  call ahead of time to confirm that I will be working and please arrive 2 hours prior to the end of my shift to guarantee that you will be seen by myself.  If you are seen at our office for another reason or I am out of the office for an extended period, some providers may be willing to provide a refill on your medications but others are not. It is left up to each provider's discretion at the time of the visit.   6. Please treat our staff with the respect with which you would like to be treated. I know that our medical system can be frustrating but they are doing their job to the best of their abilities as they were instructed.

## 2014-08-18 ENCOUNTER — Encounter: Payer: Self-pay | Admitting: Family Medicine

## 2014-08-18 LAB — COMPREHENSIVE METABOLIC PANEL
ALBUMIN: 4.3 g/dL (ref 3.5–5.2)
ALT: 15 U/L (ref 0–53)
AST: 17 U/L (ref 0–37)
Alkaline Phosphatase: 40 U/L (ref 39–117)
BUN: 17 mg/dL (ref 6–23)
CALCIUM: 9.2 mg/dL (ref 8.4–10.5)
CHLORIDE: 105 meq/L (ref 96–112)
CO2: 27 mEq/L (ref 19–32)
CREATININE: 1.18 mg/dL (ref 0.50–1.35)
Glucose, Bld: 85 mg/dL (ref 70–99)
POTASSIUM: 4.6 meq/L (ref 3.5–5.3)
Sodium: 137 mEq/L (ref 135–145)
TOTAL PROTEIN: 7.6 g/dL (ref 6.0–8.3)
Total Bilirubin: 0.6 mg/dL (ref 0.2–1.2)

## 2014-08-18 LAB — LIPID PANEL
Cholesterol: 168 mg/dL (ref 0–200)
HDL: 65 mg/dL (ref >39)
LDL CALC: 94 mg/dL (ref 0–99)
Total CHOL/HDL Ratio: 2.6 Ratio
Triglycerides: 44 mg/dL (ref <150)
VLDL: 9 mg/dL (ref 0–40)

## 2014-08-18 MED ORDER — EZETIMIBE 10 MG PO TABS: ORAL_TABLET | ORAL | Status: DC

## 2014-09-07 ENCOUNTER — Other Ambulatory Visit: Payer: Self-pay | Admitting: Family Medicine

## 2014-09-10 ENCOUNTER — Telehealth: Payer: Self-pay

## 2014-09-10 NOTE — Telephone Encounter (Signed)
Noted, thanks, es

## 2014-09-10 NOTE — Telephone Encounter (Signed)
Pt wanted Dr. Clelia CroftShaw to know he had the nasal surgery done by DR. BYRD and was put on pain medicine along with antibiotics. Please call [phone]

## 2014-10-07 ENCOUNTER — Telehealth: Payer: Self-pay

## 2014-10-07 NOTE — Telephone Encounter (Signed)
Dr. Clelia CroftShaw, [facility] to ask if it was ok for pt to RF his pain med 3 days early. He wants it filled on 12/14 instead of 12/17

## 2014-10-08 NOTE — Telephone Encounter (Signed)
Pt states he did extra work over the weekend. Pt would like this refilled early. Advised him that we would not be able to refill and he asked for me to send this message to Dr. Clelia CroftShaw- she would understand.

## 2014-10-08 NOTE — Telephone Encounter (Signed)
Why?  If just running out of pain meds to soon than no and would recommend referral to pain management if need for medication is increasing.  If pt is leaving that state for the holidays, then ok to fill early for this fill but next rx will not will not be dispensed in advance. Needs OV for any medication refills.

## 2014-10-09 NOTE — Telephone Encounter (Signed)
Discussed w/ pt - he has recently had numerous work engagements that have increased his pain.  He only has 3-4 tabs left - is trying to prolong them as much as possible but he will be totally on on 12/14 with his rx not to be filled to 12/16.  Permission given to fill on 12/14 this once but then requests that next rx last him 32 days so he can return to fills on the 16th.  Pt informed that I was very concerned about his tolerance to the narcotics - his #/mo was increased form 120 to 150 in October and within 2 months he is already using more than prescribed.

## 2014-10-09 NOTE — Telephone Encounter (Signed)
No - no early refill - I will call pt later today to let him know.

## 2014-10-28 ENCOUNTER — Ambulatory Visit (INDEPENDENT_AMBULATORY_CARE_PROVIDER_SITE_OTHER): Payer: 59 | Admitting: Family Medicine

## 2014-10-28 VITALS — BP 134/82 | HR 50 | Temp 97.7°F | Resp 18 | Ht 67.0 in | Wt 238.0 lb

## 2014-10-28 DIAGNOSIS — E119 Type 2 diabetes mellitus without complications: Secondary | ICD-10-CM

## 2014-10-28 DIAGNOSIS — D692 Other nonthrombocytopenic purpura: Secondary | ICD-10-CM

## 2014-10-28 DIAGNOSIS — J309 Allergic rhinitis, unspecified: Secondary | ICD-10-CM

## 2014-10-28 DIAGNOSIS — E785 Hyperlipidemia, unspecified: Secondary | ICD-10-CM

## 2014-10-28 DIAGNOSIS — R0981 Nasal congestion: Secondary | ICD-10-CM

## 2014-10-28 DIAGNOSIS — M546 Pain in thoracic spine: Secondary | ICD-10-CM

## 2014-10-28 DIAGNOSIS — G894 Chronic pain syndrome: Secondary | ICD-10-CM

## 2014-10-28 DIAGNOSIS — Z79899 Other long term (current) drug therapy: Secondary | ICD-10-CM

## 2014-10-28 LAB — POCT URINALYSIS DIPSTICK
Bilirubin, UA: NEGATIVE
GLUCOSE UA: NEGATIVE
Ketones, UA: NEGATIVE
Leukocytes, UA: NEGATIVE
Nitrite, UA: NEGATIVE
PH UA: 7
Protein, UA: NEGATIVE
RBC UA: NEGATIVE
Spec Grav, UA: 1.02
UROBILINOGEN UA: 0.2

## 2014-10-28 MED ORDER — HYDROCODONE-ACETAMINOPHEN 10-325 MG PO TABS: 1.0000 | ORAL_TABLET | ORAL | Status: DC | PRN

## 2014-10-28 MED ORDER — HYDROCODONE-ACETAMINOPHEN 10-325 MG PO TABS
1.0000 | ORAL_TABLET | ORAL | Status: DC | PRN
Start: 2014-10-28 — End: 2015-02-01

## 2014-10-28 MED ORDER — MONTELUKAST SODIUM 10 MG PO TABS: 10.0000 mg | ORAL_TABLET | Freq: Every day | ORAL | Status: DC

## 2014-10-28 MED ORDER — LEVOCETIRIZINE DIHYDROCHLORIDE 5 MG PO TABS: 5.0000 mg | ORAL_TABLET | Freq: Every evening | ORAL | Status: DC

## 2014-10-28 NOTE — Progress Notes (Signed)
Subjective:    Patient ID: Troy Obrien, male    DOB: 07-09-1951, 63 y.o.   MRN: [id number] Chief Complaint  Patient presents with  . Cellulitis    spots on both feet x2 mths     HPI   Has not really noticed how long he had the rash - wife recently pointed it out to him but he thinks it has been there for a very long time - thinks it might have changed a little but not sure.   A little itchy but not painful or warm.  Has not tried anything. Last month needed an early refill on his hydrocodone 10mg  5x/d (=150 tabs/mo) because he reports that over the holidays he worked a ton and so had to lift his heavy amp very frequently which exacerbated his right-sided thoracic pain.  I reluctantly allowed this but told him that his next refill he will need to stretch for about 33-34 days to make up for this - this is because he is on such high doses of narcotics and increases in dose and frequency has ramped up very quickly and very recently.  Wife doing well - Marigene EhlersBarbara Harris who is a pt of mine as well.  Past Medical History  Diagnosis Date  . Headache(784.0)   . Hypertension   . Hyperlipidemia   . Arthritis   . Inguinal hernia   . Gout   . Glaucoma    Current Outpatient Prescriptions on File Prior to Visit  Medication Sig Dispense Refill  . aspirin 81 MG tablet Take 81 mg by mouth daily.    . bimatoprost (LUMIGAN) 0.01 % SOLN Place 1 drop into both eyes at bedtime.    Marland Kitchen. doxazosin (CARDURA) 4 MG tablet Take 1 tablet (4 mg total) by mouth at bedtime. 30 tablet 5  . ezetimibe (ZETIA) 10 MG tablet TAKE 1 TABLET EVERY DAY 30 tablet 5   No current facility-administered medications on file prior to visit.   Allergies  Allergen Reactions  . Laxative [Bisacodyl] Hives  . Cyclobenzaprine Other (See Comments)    Hand twitching/spasms      Review of Systems  Constitutional: Negative for fever, chills, activity change, appetite change and unexpected weight change.  Gastrointestinal: Negative  for abdominal pain, diarrhea and constipation.  Genitourinary: Negative for urgency, frequency, decreased urine volume and difficulty urinating.  Musculoskeletal: Positive for myalgias, back pain and arthralgias. Negative for joint swelling and gait problem.  Skin: Positive for color change and rash. Negative for wound.  Neurological: Negative for dizziness, weakness, light-headedness and numbness.  Hematological: Negative for adenopathy. Bruises/bleeds easily.  Psychiatric/Behavioral: Negative for sleep disturbance.       Objective:  BP 134/82 mmHg  Pulse 50  Temp(Src) 97.7 F (36.5 C) (Oral)  Resp 18  Ht 5\' 7"  (1.702 m)  Wt 238 lb (107.956 kg)  BMI 37.27 kg/m2  SpO2 98% Physical Exam  Constitutional: He is oriented to person, place, and time. He appears well-developed and well-nourished. No distress.  HENT:  Head: Normocephalic and atraumatic.  Right Ear: Tympanic membrane is retracted. A middle ear effusion is present.  Left Ear: Tympanic membrane is retracted. A middle ear effusion is present.  Nose: Mucosal edema and rhinorrhea present.  Mouth/Throat: Uvula is midline, oropharynx is clear and moist and mucous membranes are normal.  Eyes: Conjunctivae are normal. Pupils are equal, round, and reactive to light. No scleral icterus.  Neck: Normal range of motion. Neck supple. No thyromegaly present.  Cardiovascular: Normal  rate, regular rhythm, normal heart sounds and intact distal pulses.   Pulmonary/Chest: Effort normal and breath sounds normal. No respiratory distress.  Musculoskeletal: He exhibits no edema.  Lymphadenopathy:       Head (right side): No submandibular, no tonsillar, no preauricular and no posterior auricular adenopathy present.       Head (left side): No submandibular, no tonsillar, no preauricular and no posterior auricular adenopathy present.    He has no cervical adenopathy.       Right: No supraclavicular adenopathy present.       Left: No supraclavicular  adenopathy present.  Neurological: He is alert and oriented to person, place, and time.  Skin: Skin is warm and dry. Bruising, ecchymosis, petechiae and rash noted. Rash is maculopapular. He is not diaphoretic. No erythema.  Psychiatric: He has a normal mood and affect. His behavior is normal.     EXAM: THORACIC SPINE - 2 VIEW  COMPARISON: DG CHEST 2V dated 10/28/2013  FINDINGS: The thoracic vertebral bodies are preserved in height. There is calcification of the anterior longitudinal ligament of the mid and lower thoracic spine. There are osteophytes at multiple thoracic levels on the right. The intervertebral disc space heights are reasonably well maintained. The pedicles appear intact. There are no abnormal paravertebral soft tissue densities.  IMPRESSION: There is no acute bony abnormality of the thoracic spine. There is mild degenerative change with calcification of the anterior longitudinal ligament and multi level right-sided osteophytes.     Assessment & Plan:  Pt is VERY difficult to get blood from - multiple sticks even at [facility] drawing station -  Pt will need to come in for lab only visit in about 1 month so we can also get lipid panel and hgba1c but [facility] gets back as they were [facility] to [facility] as well.  Over 40 min spent in face-to-face evaluation of and consultation with patient and coordination of care.  Purpura - Plan: POCT urinalysis dipstick, POCT CBC, POCT SEDIMENTATION RATE, Protime-INR, APTT, C-reactive protein, CANCELED: POCT CBC, CANCELED: POCT SEDIMENTATION RATE, CANCELED: C-reactive protein, CANCELED: APTT, CANCELED: Protime-INR, CANCELED: POCT glucose (manual entry) -  Long-standing and mild - likely benign but recommend labs - unable to obtain today so future orders entered.  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  Today I have utilized the [place] Controlled Substance Registry's online query to confirm compliance regarding the patient's narcotic pain medications. My review reveals appropriate prescription fills and that [facility] is the sole provider of these medications. Rechecks will occur regularly and the patient is aware of our use of the system.  Reviewed controlled sub policy again w/ pt - emphasized that he cannot use more medication than rx'ed and will not be allowed any additional early fills - if he continues to use more than rx'ed I will need to refer him to Pain Management for further treatment. Pt understands and agrees.  Will need to f/u w/ OV in 3 months for any additional narcotic refills.  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  Chronic nasal congestion - Plan: POCT urinalysis dipstick, CANCELED: POCT CBC, CANCELED: POCT SEDIMENTATION RATE, CANCELED: C-reactive protein, CANCELED: APTT, CANCELED: Protime-INR, CANCELED: POCT glucose (manual entry) - pt disappointed about continuing difficulty with chronic nasal congestion persisting after recent ENT surgery - advised pt restart flonase and cont  xyzal and singulair until f/u w/ ENT for further eval/trx  Allergic rhinitis, unspecified allergic rhinitis type  Hyperlipidemia - Plan: Lipid panel - goal LDL <100  Polypharmacy - Plan: Comprehensive metabolic panel  Meds ordered this encounter  Medications  . HYDROcodone-acetaminophen (NORCO) 10-325 MG per tablet    Sig: Take 1 tablet by mouth every 4 (four) hours as needed. May fill on 12/15/14.    Dispense:  150 tablet    Refill:  0  . HYDROcodone-acetaminophen (NORCO) 10-325 MG per tablet    Sig: Take 1 tablet by mouth every 4 (four) hours as needed for severe pain. May fill on 01/14/15.    Dispense:  150 tablet    Refill:  0    This is a 30d supply.  Marland Kitchen HYDROcodone-acetaminophen (NORCO) 10-325 MG per tablet    Sig: Take 1 tablet by mouth every 4 (four) hours as needed. May fill on 11/13/14    Dispense:  150 tablet    Refill:  0  . montelukast (SINGULAIR) 10 MG tablet    Sig: Take 1 tablet (10 mg total) by mouth at bedtime.    Dispense:  30 tablet    Refill:  3  . levocetirizine (XYZAL) 5 MG tablet    Sig: Take 1 tablet (5 mg total) by mouth every evening.    Dispense:  30 tablet    Refill:  3    I personally performed the services described in this documentation, which was scribed in my presence. The recorded information has been reviewed and considered, and addended by me as needed.  [name], MD MPH

## 2014-10-28 NOTE — Patient Instructions (Signed)
Please remember that at your next office visit in 3 months you need to be fasting for your blood work and cholesterol check.  Apply a good quality thick lotion without color or scent additives twice a day, especially after showering. Take pictures of your rash at least once a month to document any changes. This could be due to diabetes but we will keep an eye on it and will re-evaluate at your next visit.  Please make a f/u appt with the ENT dr asap as your congestion has not significantly improved at all.   Here is a reminder of my protocol for prescribing chronic pain medicine. 1. You will need to be seen for an office visit every 3 months.  Hopefully, all prescriptions can be issued at that time so that you do not need to call in for refill requests 2.  If for some reason, you are not issued all of your prescriptions at a visit, please allow a 72 hour or 3 day window to respond to your refill requests. This is especially important if you need to pick up a prescription over the weekend, so make sure you call during the week prior (e.g. Call by Thursday if you want to be able to pick up a prescription by Sunday). 3. You may only fill your prescriptions at one pharmacy. If you are traveling out of town for some reason during a time that you will need a refill filled, I request that you notify our office and stick to one chain - such was [facility], [facility], [facility], etc. 4. You may not receive controlled substances from any other provider. If there is an acute condition for which you are seen elsewhere (e.g. you go to the ER after a car accident), please alert the provider that you are on a controlled substance contract and if you are still prescribed a controlled substance, you alert our office within 24 hours of filling it.   5. If you are planning to be seen at our [facility] walk-in clinic for refills, please call ahead of time to confirm that I will be working and please arrive 2 hours prior to the end of my  shift to guarantee that you will be seen by myself.  If you are seen at our office for another reason or I am out of the office for an extended period, some providers may be willing to provide a refill on your medications but others are not. It is left up to each provider's discretion at the time of the visit.   6. Please treat our staff with the respect with which you would like to be treated. I know that our medical system can be frustrating but they are doing their job to the best of their abilities as they were instructed.

## 2014-10-30 ENCOUNTER — Telehealth: Payer: Self-pay | Admitting: Family Medicine

## 2014-10-30 NOTE — Telephone Encounter (Signed)
-----   Message from Sherren MochaEva N Shaw, MD sent at 10/28/2014  5:09 PM EST ----- Please see if pt would like to sched a f/u appointment in early March 2015.  Also, pt needs to come in for lab-only visit for fasting labs in 1 month but would like to only come in when [facility] working - very difficult lab draw - she is the only person who has had duplicated success in obtaining his blood.

## 2014-11-04 ENCOUNTER — Other Ambulatory Visit: Payer: Self-pay

## 2014-11-04 MED ORDER — MELOXICAM 15 MG PO TABS: ORAL_TABLET | ORAL | Status: DC

## 2015-02-01 ENCOUNTER — Ambulatory Visit (INDEPENDENT_AMBULATORY_CARE_PROVIDER_SITE_OTHER): Payer: BLUE CROSS/BLUE SHIELD | Admitting: Family Medicine

## 2015-02-01 ENCOUNTER — Other Ambulatory Visit: Payer: Self-pay | Admitting: Family Medicine

## 2015-02-01 VITALS — BP 140/92 | HR 50 | Temp 98.1°F | Resp 12 | Ht 66.75 in | Wt 234.4 lb

## 2015-02-01 DIAGNOSIS — E11620 Type 2 diabetes mellitus with diabetic dermatitis: Secondary | ICD-10-CM

## 2015-02-01 DIAGNOSIS — J328 Other chronic sinusitis: Secondary | ICD-10-CM | POA: Diagnosis not present

## 2015-02-01 DIAGNOSIS — E559 Vitamin D deficiency, unspecified: Secondary | ICD-10-CM

## 2015-02-01 DIAGNOSIS — I10 Essential (primary) hypertension: Secondary | ICD-10-CM | POA: Diagnosis not present

## 2015-02-01 DIAGNOSIS — E785 Hyperlipidemia, unspecified: Secondary | ICD-10-CM | POA: Diagnosis not present

## 2015-02-01 DIAGNOSIS — Z79899 Other long term (current) drug therapy: Secondary | ICD-10-CM | POA: Diagnosis not present

## 2015-02-01 DIAGNOSIS — E119 Type 2 diabetes mellitus without complications: Secondary | ICD-10-CM

## 2015-02-01 DIAGNOSIS — D692 Other nonthrombocytopenic purpura: Secondary | ICD-10-CM | POA: Diagnosis not present

## 2015-02-01 DIAGNOSIS — M546 Pain in thoracic spine: Secondary | ICD-10-CM

## 2015-02-01 DIAGNOSIS — N401 Enlarged prostate with lower urinary tract symptoms: Secondary | ICD-10-CM | POA: Diagnosis not present

## 2015-02-01 DIAGNOSIS — Z8639 Personal history of other endocrine, nutritional and metabolic disease: Secondary | ICD-10-CM

## 2015-02-01 DIAGNOSIS — R351 Nocturia: Secondary | ICD-10-CM

## 2015-02-01 DIAGNOSIS — R21 Rash and other nonspecific skin eruption: Secondary | ICD-10-CM | POA: Diagnosis not present

## 2015-02-01 DIAGNOSIS — H409 Unspecified glaucoma: Secondary | ICD-10-CM | POA: Diagnosis not present

## 2015-02-01 DIAGNOSIS — J309 Allergic rhinitis, unspecified: Secondary | ICD-10-CM

## 2015-02-01 LAB — COMPREHENSIVE METABOLIC PANEL
ALBUMIN: 4.1 g/dL (ref 3.5–5.2)
ALT: 15 U/L (ref 0–53)
ALT: 15 U/L (ref 0–53)
AST: 15 U/L (ref 0–37)
AST: 15 U/L (ref 0–37)
Albumin: 4.1 g/dL (ref 3.5–5.2)
Alkaline Phosphatase: 46 U/L (ref 39–117)
Alkaline Phosphatase: 46 U/L (ref 39–117)
BUN: 17 mg/dL (ref 6–23)
BUN: 17 mg/dL (ref 6–23)
CHLORIDE: 105 meq/L (ref 96–112)
CO2: 26 meq/L (ref 19–32)
CO2: 26 meq/L (ref 19–32)
CREATININE: 1.1 mg/dL (ref 0.50–1.35)
Calcium: 9.3 mg/dL (ref 8.4–10.5)
Calcium: 9.3 mg/dL (ref 8.4–10.5)
Chloride: 105 mEq/L (ref 96–112)
Creat: 1.1 mg/dL (ref 0.50–1.35)
GLUCOSE: 85 mg/dL (ref 70–99)
GLUCOSE: 85 mg/dL (ref 70–99)
POTASSIUM: 4.4 meq/L (ref 3.5–5.3)
POTASSIUM: 4.4 meq/L (ref 3.5–5.3)
Sodium: 137 mEq/L (ref 135–145)
Sodium: 137 mEq/L (ref 135–145)
TOTAL PROTEIN: 7.4 g/dL (ref 6.0–8.3)
Total Bilirubin: 0.5 mg/dL (ref 0.2–1.2)
Total Bilirubin: 0.5 mg/dL (ref 0.2–1.2)
Total Protein: 7.4 g/dL (ref 6.0–8.3)

## 2015-02-01 LAB — MICROALBUMIN / CREATININE URINE RATIO
Creatinine, Urine: 255.8 mg/dL
Creatinine, Urine: 255.8 mg/dL
MICROALB/CREAT RATIO: 11.3 mg/g (ref 0.0–30.0)
MICROALB/CREAT RATIO: 11.3 mg/g (ref 0.0–30.0)
Microalb, Ur: 2.9 mg/dL — ABNORMAL HIGH (ref <2.0)
Microalb, Ur: 2.9 mg/dL — ABNORMAL HIGH (ref <2.0)

## 2015-02-01 LAB — TESTOSTERONE, FREE, TOTAL, SHBG: TESTOSTERONE: 328 ng/dL (ref 300–890)

## 2015-02-01 LAB — POCT CBC
Granulocyte percent: 61.3 %G (ref 37–80)
HCT, POC: 44.2 % (ref 43.5–53.7)
HEMOGLOBIN: 14.2 g/dL (ref 14.1–18.1)
LYMPH, POC: 2.2 (ref 0.6–3.4)
MCH, POC: 25.7 pg — AB (ref 27–31.2)
MCHC: 32.1 g/dL (ref 31.8–35.4)
MCV: 80.2 fL (ref 80–97)
MID (cbc): 0.6 (ref 0–0.9)
MPV: 7.9 fL (ref 0–99.8)
PLATELET COUNT, POC: 316 10*3/uL (ref 142–424)
POC GRANULOCYTE: 4.4 (ref 2–6.9)
POC LYMPH PERCENT: 31 %L (ref 10–50)
POC MID %: 7.7 % (ref 0–12)
RBC: 5.52 M/uL (ref 4.69–6.13)
RDW, POC: 15.3 %
WBC: 7.2 10*3/uL (ref 4.6–10.2)

## 2015-02-01 LAB — POCT SKIN KOH: SKIN KOH, POC: NEGATIVE

## 2015-02-01 LAB — POCT SEDIMENTATION RATE: POCT SED RATE: 20 mm/hr (ref 0–22)

## 2015-02-01 MED ORDER — HYDROCODONE-ACETAMINOPHEN 10-325 MG PO TABS: 1.0000 | ORAL_TABLET | ORAL | Status: DC | PRN

## 2015-02-01 MED ORDER — LEVOCETIRIZINE DIHYDROCHLORIDE 5 MG PO TABS: 5.0000 mg | ORAL_TABLET | Freq: Every evening | ORAL | Status: DC

## 2015-02-01 MED ORDER — CLOTRIMAZOLE-BETAMETHASONE 1-0.05 % EX CREA: 1.0000 "application " | TOPICAL_CREAM | Freq: Two times a day (BID) | CUTANEOUS | Status: DC

## 2015-02-01 MED ORDER — PREDNISONE 20 MG PO TABS: ORAL_TABLET | ORAL | Status: DC

## 2015-02-01 MED ORDER — EZETIMIBE 10 MG PO TABS: ORAL_TABLET | ORAL | Status: DC

## 2015-02-01 MED ORDER — MONTELUKAST SODIUM 10 MG PO TABS: 10.0000 mg | ORAL_TABLET | Freq: Every day | ORAL | Status: DC

## 2015-02-01 MED ORDER — AMOXICILLIN-POT CLAVULANATE 875-125 MG PO TABS: 1.0000 | ORAL_TABLET | Freq: Two times a day (BID) | ORAL | Status: DC

## 2015-02-01 MED ORDER — DOXAZOSIN MESYLATE 4 MG PO TABS: 4.0000 mg | ORAL_TABLET | Freq: Every day | ORAL | Status: DC

## 2015-02-01 NOTE — Patient Instructions (Signed)
Please follow-up with your ENT doctor ASAP  Sinusitis Sinusitis is redness, soreness, and inflammation of the paranasal sinuses. Paranasal sinuses are air pockets within the bones of your face (beneath the eyes, the middle of the forehead, or above the eyes). In healthy paranasal sinuses, mucus is able to drain out, and air is able to circulate through them by way of your nose. However, when your paranasal sinuses are inflamed, mucus and air can become trapped. This can allow bacteria and other germs to grow and cause infection. Sinusitis can develop quickly and last only a short time (acute) or continue over a long period (chronic). Sinusitis that lasts for more than 12 weeks is considered chronic.  CAUSES  Causes of sinusitis include:  Allergies.  Structural abnormalities, such as displacement of the cartilage that separates your nostrils (deviated septum), which can decrease the air flow through your nose and sinuses and affect sinus drainage.  Functional abnormalities, such as when the small hairs (cilia) that line your sinuses and help remove mucus do not work properly or are not present. SIGNS AND SYMPTOMS  Symptoms of acute and chronic sinusitis are the same. The primary symptoms are pain and pressure around the affected sinuses. Other symptoms include:  Upper toothache.  Earache.  Headache.  Bad breath.  Decreased sense of smell and taste.  A cough, which worsens when you are lying flat.  Fatigue.  Fever.  Thick drainage from your nose, which often is green and may contain pus (purulent).  Swelling and warmth over the affected sinuses. DIAGNOSIS  Your health care provider will perform a physical exam. During the exam, your health care provider may:  Look in your nose for signs of abnormal growths in your nostrils (nasal polyps).  Tap over the affected sinus to check for signs of infection.  View the inside of your sinuses (endoscopy) using an imaging device that has  a light attached (endoscope). If your health care provider suspects that you have chronic sinusitis, one or more of the following tests may be recommended:  Allergy tests.  Nasal culture. A sample of mucus is taken from your nose, sent to a lab, and screened for bacteria.  Nasal cytology. A sample of mucus is taken from your nose and examined by your health care provider to determine if your sinusitis is related to an allergy. TREATMENT  Most cases of acute sinusitis are related to a viral infection and will resolve on their own within 10 days. Sometimes medicines are prescribed to help relieve symptoms (pain medicine, decongestants, nasal steroid sprays, or saline sprays).  However, for sinusitis related to a bacterial infection, your health care provider will prescribe antibiotic medicines. These are medicines that will help kill the bacteria causing the infection.  Rarely, sinusitis is caused by a fungal infection. In theses cases, your health care provider will prescribe antifungal medicine. For some cases of chronic sinusitis, surgery is needed. Generally, these are cases in which sinusitis recurs more than 3 times per year, despite other treatments. HOME CARE INSTRUCTIONS   Drink plenty of water. Water helps thin the mucus so your sinuses can drain more easily.  Use a humidifier.  Inhale steam 3 to 4 times a day (for example, sit in the bathroom with the shower running).  Apply a warm, moist washcloth to your face 3 to 4 times a day, or as directed by your health care provider.  Use saline nasal sprays to help moisten and clean your sinuses.  Take medicines only  as directed by your health care provider.  If you were prescribed either an antibiotic or antifungal medicine, finish it all even if you start to feel better. SEEK IMMEDIATE MEDICAL CARE IF:  You have increasing pain or severe headaches.  You have nausea, vomiting, or drowsiness.  You have swelling around your  face.  You have vision problems.  You have a stiff neck.  You have difficulty breathing. MAKE SURE YOU:   Understand these instructions.  Will watch your condition.  Will get help right away if you are not doing well or get worse. Document Released: 10/17/2005 Document Revised: 03/03/2014 Document Reviewed: 11/01/2011 [facility] Patient Information 2015 [place], [place]. This information is not intended to replace advice given to you by your health care provider. Make sure you discuss any questions you have with your health care provider.

## 2015-02-01 NOTE — Progress Notes (Addendum)
Subjective:  This chart was scribed for Troy JulyEva N. Clelia CroftShaw, MD by Troy Obrien, ED Scribe. This patient was seen in room 9 and the patient's care was started at 12:31 PM.     Patient ID: Troy Obrien, male    DOB: 1950-12-05, 64 y.o.   MRN: [id number] Chief Complaint  Patient presents with  . Follow-up  . Sinusitis    HPI HPI Comments: Troy Obrien is a 64 y.o. male, with PMH noted below including HTN, who presents to the [facility] complaining of cold Sx including diaphoresis, chills, SOB, sinus pressure, rhinorrhea, sleep disturbances, productive cough with discolored sputum, congestion onset 5 days ago. Pt reports taking OTC meds including Tylenol Severe Cold and Flu with some relief.   Zetia: Pt is not taking Zetia due to price but is willing to restart.   Fish Oil: Pt denies taking fish oil supplements. However, notes he is willing to start said supplement. Pt further notes that whenever he starts any type of supplement, 3 days following the regimen he develops headaches.   Cardura: Pt also stopped his cardura and notes that he is urinating much more frequently. Pt will restart said meds.   Skin Peeling: Pt also complains of dry, peeling skin to the right ankle. Pt denies that the area is itchy.   Diffused Sweating: Pt complains of intermittent episodes of diaphoresis onset one year ago. Pt denies taking any measures at home to alleviate his Sx.   Past Medical History  Diagnosis Date  . Headache(784.0)   . Hypertension   . Hyperlipidemia   . Arthritis   . Inguinal hernia   . Gout   . Glaucoma    Allergies  Allergen Reactions  . Laxative [Bisacodyl] Hives  . Cyclobenzaprine Other (See Comments)    Hand twitching/spasms     Current outpatient prescriptions:  .  aspirin 81 MG tablet, Take 81 mg by mouth daily., Disp: , Rfl:  .  bimatoprost (LUMIGAN) 0.01 % SOLN, Place 1 drop into both eyes at bedtime., Disp: , Rfl:  .  doxazosin (CARDURA) 4 MG  tablet, Take 1 tablet (4 mg total) by mouth at bedtime., Disp: 30 tablet, Rfl: 5 .  HYDROcodone-acetaminophen (NORCO) 10-325 MG per tablet, Take 1 tablet by mouth every 4 (four) hours as needed. May fill on 12/15/14., Disp: 150 tablet, Rfl: 0 .  levocetirizine (XYZAL) 5 MG tablet, Take 1 tablet (5 mg total) by mouth every evening., Disp: 30 tablet, Rfl: 3 .  meloxicam (MOBIC) 15 MG tablet, TAKE 1 TABLET BY MOUTH EVERY DAY FOR PAIN AND INFLAMMATION, Disp: 30 tablet, Rfl: 5 .  ezetimibe (ZETIA) 10 MG tablet, TAKE 1 TABLET EVERY DAY (Patient not taking: Reported on 02/01/2015), Disp: 30 tablet, Rfl: 5 .  montelukast (SINGULAIR) 10 MG tablet, Take 1 tablet (10 mg total) by mouth at bedtime. (Patient not taking: Reported on 02/01/2015), Disp: 30 tablet, Rfl: 3   Review of Systems  Constitutional: Positive for chills, diaphoresis and fatigue. Negative for fever, activity change, appetite change and unexpected weight change.  HENT: Positive for congestion, postnasal drip, rhinorrhea, sinus pressure, sore throat and trouble swallowing. Negative for nosebleeds and voice change.   Respiratory: Positive for cough and shortness of breath.   Cardiovascular: Negative for chest pain and leg swelling.  Endocrine: Positive for polyuria.  Genitourinary: Positive for frequency. Negative for dysuria, enuresis and difficulty urinating.  Musculoskeletal: Positive for myalgias, back pain and arthralgias.  Skin: Positive for  rash.  Hematological: Negative for adenopathy. Bruises/bleeds easily.  Psychiatric/Behavioral: Positive for sleep disturbance and dysphoric mood. Negative for confusion. The patient is nervous/anxious.        Objective:   Physical Exam  Constitutional: He is oriented to person, place, and time. He appears well-developed and well-nourished. No distress.  HENT:  Head: Normocephalic and atraumatic.  Right Ear: Tympanic membrane is erythematous.  Left Ear: Tympanic membrane is erythematous.    Mouth/Throat: Uvula is midline. Posterior oropharyngeal edema and posterior oropharyngeal erythema present. No oropharyngeal exudate.  Nose: Bilaterally boggy pale edematous nasal mucousa Both nasal passages are completely shut off with clear rhinitis. Tonsillar adenopathy bilaterally.   Eyes: Conjunctivae and EOM are normal.  Neck: Neck supple.  Cardiovascular: Normal rate and regular rhythm.   Pulmonary/Chest: Effort normal and breath sounds normal. No respiratory distress.  Musculoskeletal: Normal range of motion.  Lymphadenopathy:    He has cervical adenopathy.  Neurological: He is alert and oriented to person, place, and time.  Skin: Skin is warm and dry.  Medial Aspect of Right Ankle: Hyperpigmented macule approx 6-7 mm serpiginous but well defined border with scaling around.   Psychiatric: He has a normal mood and affect. His behavior is normal.  Nursing note and vitals reviewed.  Filed Vitals:   02/01/15 1154  BP: 140/92  Pulse: 50  Temp: 98.1 F (36.7 C)  Resp: 12         Assessment & Plan:  DIAGNOSTIC STUDIES: Oxygen Saturation is 97% on RA, nl by my interpretation.    COORDINATION OF CARE: 12:38 PM-Discussed treatment plan with pt at bedside and pt agreed to plan.    Essential hypertension  Glaucoma - dr. Lottie Dawson at [facility]  Right-sided thoracic back pain  Polypharmacy - Plan: Comprehensive metabolic panel  BPH associated with nocturia - Plan: PSA, Testosterone, Free, Total, SHBG - PSA completed on Mar 26, 2014-- recheck completed during today's office visit.  Restart doxazosin as was having sig decrease in nocturia w/ this.  Type 2 diabetes mellitus with diabetic dermatitis - Plan: Microalbumin / creatinine urine ratio  Rash and nonspecific skin eruption - Plan: POCT Skin KOH  Other chronic sinusitis  Allergic rhinitis, unspecified allergic rhinitis type - severe - pt does not appear to have had ANY improvement after recent surgery - cont flonase and  f/u w/ ENT, cont antihistamine  Purpura - Plan: POCT CBC, POCT SEDIMENTATION RATE, Protime-INR, C-reactive protein, CANCELED: APTT  Diabetes mellitus without complication - Plan: Comprehensive metabolic panel, Lipid panel  Hyperlipidemia - Plan: Lipid panel - restart zetia (off for several months due to cost.) start fish oil supp 2 tabs bid, can't tolerate statin.  Cont diet/exericse  Vit D def - once wkly high dose replacement x 6 mos as level today is 9 - then start 2000-5000u otc vit D qd.  History of rickets - Plan: Vit D  25 hydroxy (rtn osteoporosis monitoring), consider aptt and pth at f/u OV.  Pt further inquired whether he should start cod liver oil noting that he took it as a child due to Dx of rickets,  Meds ordered this encounter  Medications  . montelukast (SINGULAIR) 10 MG tablet    Sig: Take 1 tablet (10 mg total) by mouth at bedtime.    Dispense:  90 tablet    Refill:  3  . levocetirizine (XYZAL) 5 MG tablet    Sig: Take 1 tablet (5 mg total) by mouth every evening.    Dispense:  90 tablet  Refill:  3  . HYDROcodone-acetaminophen (NORCO) 10-325 MG per tablet    Sig: Take 1 tablet by mouth every 4 (four) hours as needed. May fill on 04/14/15    Dispense:  150 tablet    Refill:  0  . HYDROcodone-acetaminophen (NORCO) 10-325 MG per tablet    Sig: Take 1 tablet by mouth every 4 (four) hours as needed. May fill on 02/13/15.    Dispense:  150 tablet    Refill:  0  . HYDROcodone-acetaminophen (NORCO) 10-325 MG per tablet    Sig: Take 1 tablet by mouth every 4 (four) hours as needed for severe pain. May fill on 03/14/15.    Dispense:  150 tablet    Refill:  0    This is a 30d supply.  . doxazosin (CARDURA) 4 MG tablet    Sig: Take 1 tablet (4 mg total) by mouth at bedtime.    Dispense:  90 tablet    Refill:  3  . amoxicillin-clavulanate (AUGMENTIN) 875-125 MG per tablet    Sig: Take 1 tablet by mouth 2 (two) times daily.    Dispense:  28 tablet    Refill:  0  .  predniSONE (DELTASONE) 20 MG tablet    Sig: Take 3 tabs po qd x 3d, then 2 tabs po qd x 3d, then 1 tab po qd x 3d    Dispense:  18 tablet    Refill:  0  . ezetimibe (ZETIA) 10 MG tablet    Sig: TAKE 1 TABLET EVERY DAY    Dispense:  90 tablet    Refill:  3    Over 40 min spent in face-to-face evaluation of and consultation with patient and coordination of care.  Over 50% of this time was spent counseling this patient.   I personally performed the services described in this documentation, which was scribed in my presence. The recorded information has been reviewed and considered, and addended by me as needed.  [name], MD MPH

## 2015-02-02 LAB — PROTIME-INR
INR: 1.12 (ref <1.50)
INR: 1.12 (ref <1.50)
Prothrombin Time: 14.4 seconds (ref 11.6–15.2)
Prothrombin Time: 14.4 seconds (ref 11.6–15.2)

## 2015-02-02 LAB — VITAMIN D 25 HYDROXY (VIT D DEFICIENCY, FRACTURES): VIT D 25 HYDROXY: 9 ng/mL — AB (ref 30–100)

## 2015-02-02 LAB — PSA: PSA: 0.6 ng/mL (ref <=4.00)

## 2015-02-02 LAB — TESTOSTERONE, FREE, TOTAL, SHBG
Sex Hormone Binding: 26 nmol/L (ref 22–77)
TESTOSTERONE-% FREE: 2.3 % (ref 1.6–2.9)
TESTOSTERONE: 328 ng/dL (ref 300–890)
Testosterone, Free: 74.3 pg/mL (ref 47.0–244.0)

## 2015-02-02 NOTE — Progress Notes (Signed)
Patient will give us a call later he ws in his office and was busy buy he still want to see Dr Clelia CroftShaw in three months

## 2015-02-04 ENCOUNTER — Encounter: Payer: Self-pay | Admitting: Family Medicine

## 2015-02-04 DIAGNOSIS — J3089 Other allergic rhinitis: Secondary | ICD-10-CM

## 2015-02-04 DIAGNOSIS — J328 Other chronic sinusitis: Secondary | ICD-10-CM

## 2015-02-04 DIAGNOSIS — E559 Vitamin D deficiency, unspecified: Secondary | ICD-10-CM | POA: Insufficient documentation

## 2015-02-04 HISTORY — DX: Other allergic rhinitis: J30.89

## 2015-02-04 HISTORY — DX: Other chronic sinusitis: J32.8

## 2015-02-04 MED ORDER — ERGOCALCIFEROL 1.25 MG (50000 UT) PO CAPS: 50000.0000 [IU] | ORAL_CAPSULE | ORAL | Status: DC

## 2015-02-17 ENCOUNTER — Telehealth: Payer: Self-pay

## 2015-02-17 DIAGNOSIS — R7303 Prediabetes: Secondary | ICD-10-CM

## 2015-02-17 NOTE — Telephone Encounter (Signed)
Pt's wife-Barbara called wanting to know her husband's A1C lab results. He was seen on 02/01/15. Please advise at [phone]

## 2015-02-17 NOTE — Telephone Encounter (Signed)
Ordered POCT hgba1c - was 6.6 on 04/14/14 and down to 6.0 in December - considering getting a1c by fingerstick as pt is hard stick.

## 2015-02-17 NOTE — Telephone Encounter (Signed)
Pt's wife is not on his release of information. I called pt and an A1C was not drawn. Can we put in an order for this so pt can come by to get his blood drawn. Pt states this is the main reason why he came in that day. He also gave us permission to speak with his wife about any of his health information.

## 2015-02-18 ENCOUNTER — Other Ambulatory Visit (INDEPENDENT_AMBULATORY_CARE_PROVIDER_SITE_OTHER): Payer: BLUE CROSS/BLUE SHIELD | Admitting: *Deleted

## 2015-02-18 DIAGNOSIS — E119 Type 2 diabetes mellitus without complications: Secondary | ICD-10-CM | POA: Diagnosis not present

## 2015-02-18 LAB — POCT GLYCOSYLATED HEMOGLOBIN (HGB A1C): HEMOGLOBIN A1C: 6.1

## 2015-02-18 NOTE — Progress Notes (Signed)
Fast track a1c

## 2015-02-18 NOTE — Telephone Encounter (Signed)
Left message for pt to call back  °

## 2015-02-18 NOTE — Telephone Encounter (Signed)
Spoke with pt's wife advised message from Dr. Clelia CroftShaw. Pt will come in to get A1C.

## 2015-02-23 ENCOUNTER — Telehealth: Payer: Self-pay

## 2015-02-23 ENCOUNTER — Encounter: Payer: Self-pay | Admitting: Family Medicine

## 2015-02-23 NOTE — Telephone Encounter (Signed)
Pt's wife called and LM on VM about A1C. Spoke with pt and relayed Dr. Alver FisherShaw's message and let him know we mailed him a letter.

## 2015-03-28 ENCOUNTER — Other Ambulatory Visit: Payer: Self-pay | Admitting: Family Medicine

## 2015-03-28 ENCOUNTER — Encounter: Payer: Self-pay | Admitting: Family Medicine

## 2015-03-28 DIAGNOSIS — R5383 Other fatigue: Secondary | ICD-10-CM

## 2015-03-28 DIAGNOSIS — R7989 Other specified abnormal findings of blood chemistry: Secondary | ICD-10-CM

## 2015-05-05 ENCOUNTER — Other Ambulatory Visit: Payer: Self-pay | Admitting: Family Medicine

## 2015-05-09 ENCOUNTER — Ambulatory Visit (INDEPENDENT_AMBULATORY_CARE_PROVIDER_SITE_OTHER): Payer: BLUE CROSS/BLUE SHIELD | Admitting: Family Medicine

## 2015-05-09 ENCOUNTER — Telehealth: Payer: Self-pay | Admitting: Family Medicine

## 2015-05-09 VITALS — BP 134/80 | HR 52 | Temp 97.8°F | Resp 18 | Ht 66.5 in | Wt 241.8 lb

## 2015-05-09 DIAGNOSIS — E785 Hyperlipidemia, unspecified: Secondary | ICD-10-CM | POA: Diagnosis not present

## 2015-05-09 DIAGNOSIS — R5383 Other fatigue: Secondary | ICD-10-CM

## 2015-05-09 DIAGNOSIS — Z79899 Other long term (current) drug therapy: Secondary | ICD-10-CM | POA: Diagnosis not present

## 2015-05-09 DIAGNOSIS — E11620 Type 2 diabetes mellitus with diabetic dermatitis: Secondary | ICD-10-CM

## 2015-05-09 DIAGNOSIS — J328 Other chronic sinusitis: Secondary | ICD-10-CM

## 2015-05-09 DIAGNOSIS — M546 Pain in thoracic spine: Secondary | ICD-10-CM

## 2015-05-09 DIAGNOSIS — E559 Vitamin D deficiency, unspecified: Secondary | ICD-10-CM

## 2015-05-09 DIAGNOSIS — J309 Allergic rhinitis, unspecified: Secondary | ICD-10-CM

## 2015-05-09 DIAGNOSIS — G4730 Sleep apnea, unspecified: Secondary | ICD-10-CM | POA: Diagnosis not present

## 2015-05-09 MED ORDER — HYDROCODONE-ACETAMINOPHEN 10-325 MG PO TABS: 1.0000 | ORAL_TABLET | ORAL | Status: DC | PRN

## 2015-05-09 MED ORDER — PRAVASTATIN SODIUM 20 MG PO TABS: 20.0000 mg | ORAL_TABLET | Freq: Every day | ORAL | Status: DC

## 2015-05-09 MED ORDER — AZELASTINE-FLUTICASONE 137-50 MCG/ACT NA SUSP: 1.0000 | Freq: Two times a day (BID) | NASAL | Status: DC

## 2015-05-09 NOTE — Telephone Encounter (Signed)
He needs an OV w/ me for any refills on controlled pain medicine.  He should be aware of this.    Also, we have reviewed many times prior that he was unable tolerate the statins so that is why he was put on zetia - there are no other medicines like zetia.  THere are a few old cholesterol medicines but a lot of them are expensive, have severe side effects or have to be taken multiple times throughout the day.  I will be happy to discuss this with him in detail at his next visit and we can determine at that time if the cost of the zetia is worth the benefit or if not what is his next best option.

## 2015-05-09 NOTE — Progress Notes (Addendum)
Subjective:  This chart was scribed for [name], MD by Andrew Auaven Small, ED Scribe. This patient was seen in room 5 and the patient's care was started at 3:50 PM.   Patient ID: Troy Obrien, male    DOB: Jan 12, 1951, 64 y.o.   MRN: [id number]  HPI   Chief Complaint  Patient presents with  . Medication Refill  . medication consult    Zetia   HPI Comments: Troy Obrien is a 64 y.o. Male with hx of hyperlipidemia and HTN who presents to the [facility] for a medication refill. Pt is being followed for pre diabetes and his last A1C was done 3 months ago and was 6.1.    Allergies He has been taking sudafed for his allergies but has not been using nasal sprays.   Hyperlipidemia  During last visit pt was not taking Zetia due to cost. Pt states Zetia is expensive and is wanting a different medication that is more affordable. He reports Statin would make him nauseous but is will to go back to this medication. He has tried lovastatin back in 2013.   Vitamin D deficiency  Vitamin D level was low at 9 and was started on vitamin D weekly. Pt has been compliant with Vitamin D supplements and is tolerating it well.    Testosterone Testosterone has been on the low side of normal. Pt is willing to start on testosterone and willing to come in for labs in the future.  He denies CP  Past Medical History  Diagnosis Date  . Headache(784.0)   . Hypertension   . Hyperlipidemia   . Arthritis   . Inguinal hernia   . Gout   . Glaucoma   . Rickets, fetal   . Vitamin D deficiency    Past Surgical History  Procedure Laterality Date  . Circumcision  1972  . Tonsillectomy  1963  . Carpel tunnel      bilateral  . Inguinal hernia repair  10/11/2012    Procedure: HERNIA REPAIR INGUINAL ADULT;  Surgeon: Robyne AskewPaul S Toth III, MD;  [facility];  Service: General;  Laterality: Right;  . Insertion of mesh  10/11/2012    Procedure: INSERTION OF MESH;  Surgeon: Robyne AskewPaul S Toth III, MD;   [facility];  Service: General;  Laterality: Right;   Prior to Admission medications   Medication Sig Start Date End Date Taking? Authorizing Provider  aspirin 81 MG tablet Take 81 mg by mouth daily.   Yes Historical Provider, MD  doxazosin (CARDURA) 4 MG tablet Take 1 tablet (4 mg total) by mouth at bedtime. 02/01/15  Yes Sherren MochaEva N Shaw, MD  ergocalciferol (VITAMIN D2) 50000 UNITS capsule Take 1 capsule (50,000 Units total) by mouth once a week. 02/04/15  Yes Sherren MochaEva N Shaw, MD  ezetimibe (ZETIA) 10 MG tablet TAKE 1 TABLET EVERY DAY 02/01/15  Yes Sherren MochaEva N Shaw, MD  HYDROcodone-acetaminophen [facility](NORCO) 10-325 MG per tablet Take 1 tablet by mouth every 4 (four) hours as needed. May fill on 04/14/15 02/01/15  Yes Sherren MochaEva N Shaw, MD  levocetirizine (XYZAL) 5 MG tablet Take 1 tablet (5 mg total) by mouth every evening. 02/01/15  Yes Sherren MochaEva N Shaw, MD  meloxicam (MOBIC) 15 MG tablet TAKE 1 TABLET BY MOUTH EVERY DAY FOR PAIN AND INFLAMMATION 05/06/15  Yes Sherren MochaEva N Shaw, MD  montelukast (SINGULAIR) 10 MG tablet Take 1 tablet (10 mg total) by mouth at bedtime. 02/01/15  Yes Sherren MochaEva N Shaw,  MD  amoxicillin-clavulanate (AUGMENTIN) 875-125 MG per tablet Take 1 tablet by mouth 2 (two) times daily. Patient not taking: Reported on 05/09/2015 02/01/15   Sherren Mocha, MD  bimatoprost (LUMIGAN) 0.01 % SOLN Place 1 drop into both eyes at bedtime.    Historical Provider, MD  clotrimazole-betamethasone (LOTRISONE) cream Apply 1 application topically 2 (two) times daily. To rash on inner ankle Patient not taking: Reported on 05/09/2015 02/01/15   Sherren Mocha, MD  HYDROcodone-acetaminophen [facility]) 10-325 MG per tablet Take 1 tablet by mouth every 4 (four) hours as needed. May fill on 02/13/15. Patient not taking: Reported on 05/09/2015 02/01/15   Sherren Mocha, MD  HYDROcodone-acetaminophen [facility]) 10-325 MG per tablet Take 1 tablet by mouth every 4 (four) hours as needed for severe pain. May fill on 03/14/15. Patient not taking: Reported on 05/09/2015 02/01/15   Sherren Mocha,  MD  predniSONE (DELTASONE) 20 MG tablet Take 3 tabs po qd x 3d, then 2 tabs po qd x 3d, then 1 tab po qd x 3d Patient not taking: Reported on 05/09/2015 02/01/15   Sherren Mocha, MD   Review of Systems  Constitutional: Positive for diaphoresis and fatigue. Negative for fever, chills and activity change.  HENT: Positive for congestion, postnasal drip, rhinorrhea and sinus pressure. Negative for facial swelling.   Eyes: Negative for visual disturbance.  Respiratory: Negative for shortness of breath.   Cardiovascular: Negative for chest pain.  Gastrointestinal: Negative for nausea and vomiting.  Musculoskeletal: Positive for myalgias, back pain and arthralgias. Negative for joint swelling and gait problem.  Skin: Negative for rash.  Neurological: Positive for headaches. Negative for dizziness, syncope, facial asymmetry, weakness and light-headedness.  Psychiatric/Behavioral: Positive for sleep disturbance.    Objective:   Physical Exam  Constitutional: He is oriented to person, place, and time. He appears well-developed and well-nourished. No distress.  HENT:  Head: Normocephalic and atraumatic.  Right Ear: Hearing, tympanic membrane, external ear and ear canal normal.  Left Ear: Hearing, tympanic membrane, external ear and ear canal normal.  Pale boggy mucosa. Both sides completely occluded .  Eyes: Conjunctivae and EOM are normal.  Neck: Normal range of motion. Neck supple.  Cardiovascular: Normal rate, regular rhythm and normal heart sounds.  Exam reveals no gallop and no friction rub.   No murmur heard. Pulmonary/Chest: Effort normal.  Musculoskeletal: Normal range of motion.  Lymphadenopathy:    He has no cervical adenopathy.  Neurological: He is alert and oriented to person, place, and time.  Skin: Skin is warm and dry.  Psychiatric: He has a normal mood and affect. His behavior is normal.  Nursing note and vitals reviewed.  Filed Vitals:   05/09/15 1548  BP: 140/78  Pulse: 52    Temp: 97.8 F (36.6 C)  TempSrc: Oral  Resp: 18  Height: 5' 6.5" (1.689 m)  Weight: 241 lb 12.8 oz (109.68 kg)  SpO2: 98%   Assessment & Plan:   1. Allergic rhinitis, unspecified allergic rhinitis type - pt with severe bilateral nasal obstruction - unchanged since prior surgery - audible breathing constant - cont xyzal and singular - has failed flonase and qnasl, so try dymista and f/u w/ ENT surg Dr. Jearld Fenton - i think his severe nasal congestion is contributing to sleep apnea, chronic fatigue, increased weight, etc  2. Other chronic sinusitis   3. Right-sided thoracic back pain - refilled chronic hydrocodone - pt needing early refill as his wife used some due to a hip  injury.    4. Other fatigue - prior testosterone level is borderline low - pt would really like treatment but I am concerned that he will not notice sig improvement in mood, fatigue, sex drive while his presumed sleep apnea remains untreated - can RTC for fasting a.m. Level and can consider trial of treatment vs referral to urology for further eval at that time - discussed risks in detail and pt still interested  5. Polypharmacy   6. Type 2 diabetes mellitus with diabetic dermatitis - improved w/ diet and exercise a1c 6.6 -> 6.0 -> 6.1  7. Vitamin D deficiency - cont on wkly high dose - transition to 5000u otc qd when complete  8. Sleep apnea - does not appear pt has had sleep study or seen neuro - assume he has sleep apnea due to large neck, weight, and complete nasal obstruction  9.     HPL - intolerant of lovastatin and simvastatin prev.  Lipid panel looks great on zetia but he freq stops taking it due to cost, maxed out on fish oil - not intrested in niacin or wellchol due to side effects - would like to try low dose pravastatin with coenzyme q10 instead of zetia due to cost.   Today I have utilized the [place] Controlled Substance Registry's online query to confirm compliance regarding the patient's narcotic pain medications. My  review reveals appropriate prescription fills and that [facility] is the sole provider of these medications. Rechecks will occur regularly and the patient is aware of our use of the system.  Meds ordered this encounter  Medications  . HYDROcodone-acetaminophen (NORCO) 10-325 MG per tablet    Sig: Take 1 tablet by mouth every 4 (four) hours as needed for severe pain. May fill on 05/09/15.    Dispense:  150 tablet    Refill:  0    This is a 30d supply.  Marland Kitchen HYDROcodone-acetaminophen (NORCO) 10-325 MG per tablet    Sig: Take 1 tablet by mouth every 4 (four) hours as needed. May fill on 06/08/15.    Dispense:  150 tablet    Refill:  0  . HYDROcodone-acetaminophen (NORCO) 10-325 MG per tablet    Sig: Take 1 tablet by mouth every 4 (four) hours as needed. May fill on 07/08/15    Dispense:  150 tablet    Refill:  0  . Azelastine-Fluticasone 137-50 MCG/ACT SUSP    Sig: Place 1 spray into the nose 2 (two) times daily.    Dispense:  1 Bottle    Refill:  11    Has failed qnasl, flonase, singulair, claritin, allegra, xyzal, zyrtec, and others  . pravastatin (PRAVACHOL) 20 MG tablet    Sig: Take 1 tablet (20 mg total) by mouth at bedtime.    Dispense:  90 tablet    Refill:  1    I personally performed the services described in this documentation, which was scribed in my presence. The recorded information has been reviewed and considered, and addended by me as needed.  [name], MD MPH

## 2015-05-09 NOTE — Telephone Encounter (Signed)
Patient called requesting to speak with Dr. Clelia CroftShaw. I informed the patient that Dr. Clelia CroftShaw is seeing patients and is unable to speak with the patient directly. Patient request for a refill of Hydrocodone 10-325. Patient also request for a less costly medication than Zetia 10 MG. Patient request to speak with Dr. Clelia CroftShaw directly [phone].

## 2015-05-09 NOTE — Patient Instructions (Addendum)
We will see you back in 3 months - call to schedule if you would like - i am booked out so call now - I have entered future labs  So a few days before your appointment or I see you for follow up please come in first thing in the morning before eating or drinking anything (other than water or black coffee with your meds) to get your labs so we have these to discuss at your next appointment.  Call Dr. Suzanna ObeyJohn Byers office with [facility] to get  Back in - your nose is no better and this is keeping you from deep sleep and causing increased fatigue, weight gain, elevated blood pressures, decreased pain tolerance etc since you are never actually well rested.  If he can't do anything with your nose, then we need to see if you need to get a CPAP machine through the neurologist office.  Normally for testosterone replacement, I send people to urology so that they can monitor it - you either have to put on a cream (which your wife should not touch or rub up on) or give yourself a shot every few weeks.  Start taking coenzyme Q10 along with the pravastatin instead of the zetia.

## 2015-06-22 ENCOUNTER — Ambulatory Visit (INDEPENDENT_AMBULATORY_CARE_PROVIDER_SITE_OTHER): Payer: BLUE CROSS/BLUE SHIELD | Admitting: Emergency Medicine

## 2015-06-22 VITALS — BP 140/76 | HR 67 | Temp 98.5°F | Resp 20 | Ht 66.5 in | Wt 242.0 lb

## 2015-06-22 DIAGNOSIS — J209 Acute bronchitis, unspecified: Secondary | ICD-10-CM

## 2015-06-22 DIAGNOSIS — J302 Other seasonal allergic rhinitis: Secondary | ICD-10-CM

## 2015-06-22 MED ORDER — PSEUDOEPHEDRINE-GUAIFENESIN ER 60-600 MG PO TB12: 1.0000 | ORAL_TABLET | Freq: Two times a day (BID) | ORAL | Status: DC

## 2015-06-22 MED ORDER — TRIAMCINOLONE ACETONIDE 55 MCG/ACT NA AERO: 2.0000 | INHALATION_SPRAY | Freq: Every day | NASAL | Status: DC

## 2015-06-22 MED ORDER — AZITHROMYCIN 250 MG PO TABS: ORAL_TABLET | ORAL | Status: DC

## 2015-06-22 MED ORDER — HYDROCOD POLST-CPM POLST ER 10-8 MG/5ML PO SUER: 5.0000 mL | Freq: Two times a day (BID) | ORAL | Status: DC

## 2015-06-22 NOTE — Patient Instructions (Signed)

## 2015-06-22 NOTE — Progress Notes (Signed)
Subjective:  Patient ID: Troy Obrien, male    DOB: 04-May-1951  Age: 64 y.o. MRN: [id number]  CC: URI   HPI Troy Obrien presents  with nasal congestion and postnasal drainage. He has difficulty breathing through his nose. He has clear watery nasal discharge. Has no sore throat or ear pain. Has no fever or chills. Although he said that he has cold sweats. He has persistent non-reductive cough. No wheezing or shortness of breath. He has no nausea or vomiting. No rash. He's had no improvement with over-the-counter medication. He has a history of allergic rhinitis and a rhinoplasty.  History Troy Obrien has a past medical history of Headache(784.0); Hypertension; Hyperlipidemia; Arthritis; Inguinal hernia; Gout; Glaucoma; Rickets, fetal; and Vitamin D deficiency.   He has past surgical history that includes Circumcision (1972); Tonsillectomy (1963); carpel tunnel; Inguinal hernia repair (10/11/2012); and Insertion of mesh (10/11/2012).   His  family history is not on file.  He   reports that he has never smoked. He has never used smokeless tobacco. He reports that he drinks alcohol. He reports that he does not use illicit drugs.  Outpatient Prescriptions Prior to Visit  Medication Sig Dispense Refill  . aspirin 81 MG tablet Take 81 mg by mouth daily.    . Azelastine-Fluticasone 137-50 MCG/ACT SUSP Place 1 spray into the nose 2 (two) times daily. 1 Bottle 11  . bimatoprost (LUMIGAN) 0.01 % SOLN Place 1 drop into both eyes at bedtime.    Marland Kitchen doxazosin (CARDURA) 4 MG tablet Take 1 tablet (4 mg total) by mouth at bedtime. 90 tablet 3  . ergocalciferol (VITAMIN D2) 50000 UNITS capsule Take 1 capsule (50,000 Units total) by mouth once a week. 12 capsule 1  . HYDROcodone-acetaminophen (NORCO) 10-325 MG per tablet Take 1 tablet by mouth every 4 (four) hours as needed for severe pain. May fill on 05/09/15. 150 tablet 0  . levocetirizine (XYZAL) 5 MG tablet Take 1 tablet (5 mg total) by mouth every  evening. 90 tablet 3  . meloxicam (MOBIC) 15 MG tablet TAKE 1 TABLET BY MOUTH EVERY DAY FOR PAIN AND INFLAMMATION 30 tablet 2  . montelukast (SINGULAIR) 10 MG tablet Take 1 tablet (10 mg total) by mouth at bedtime. 90 tablet 3  . pravastatin (PRAVACHOL) 20 MG tablet Take 1 tablet (20 mg total) by mouth at bedtime. 90 tablet 1  . HYDROcodone-acetaminophen (NORCO) 10-325 MG per tablet Take 1 tablet by mouth every 4 (four) hours as needed. May fill on 06/08/15. (Patient not taking: Reported on 06/22/2015) 150 tablet 0  . HYDROcodone-acetaminophen (NORCO) 10-325 MG per tablet Take 1 tablet by mouth every 4 (four) hours as needed. May fill on 07/08/15 (Patient not taking: Reported on 06/22/2015) 150 tablet 0   No facility-administered medications prior to visit.    Social History   Social History  . Marital Status: Married    Spouse Name: N/A  . Number of Children: N/A  . Years of Education: N/A   Social History Main Topics  . Smoking status: Never Smoker   . Smokeless tobacco: Never Used  . Alcohol Use: 0.0 oz/week    0 Standard drinks or equivalent per week     Comment: rare  . Drug Use: No  . Sexual Activity: Yes    Birth Control/ Protection: None   Other Topics Concern  . None   Social History Narrative     Review of Systems  Constitutional: Negative for fever, chills and appetite change.  HENT: Negative for congestion, ear pain, postnasal drip, sinus pressure and sore throat.   Eyes: Negative for pain and redness.  Respiratory: Negative for cough, shortness of breath and wheezing.   Cardiovascular: Negative for leg swelling.  Gastrointestinal: Negative for nausea, vomiting, abdominal pain, diarrhea, constipation and blood in stool.  Endocrine: Negative for polyuria.  Genitourinary: Negative for dysuria, urgency, frequency and flank pain.  Musculoskeletal: Negative for gait problem.  Skin: Negative for rash.  Neurological: Negative for weakness and headaches.    Psychiatric/Behavioral: Negative for confusion and decreased concentration. The patient is not nervous/anxious.     Objective:  BP 140/76 mmHg  Pulse 67  Temp(Src) 98.5 F (36.9 C) (Oral)  Resp 20  Ht 5' 6.5" (1.689 m)  Wt 242 lb (109.77 kg)  BMI 38.48 kg/m2  SpO2 98%  Physical Exam  Constitutional: He is oriented to person, place, and time. He appears well-developed and well-nourished. No distress.  HENT:  Head: Normocephalic and atraumatic.  Right Ear: External ear normal.  Left Ear: External ear normal.  Nose: Nose normal.  Eyes: Conjunctivae and EOM are normal. Pupils are equal, round, and reactive to light. No scleral icterus.  Neck: Normal range of motion. Neck supple. No tracheal deviation present.  Cardiovascular: Normal rate, regular rhythm and normal heart sounds.   Pulmonary/Chest: Effort normal. No respiratory distress. He has no wheezes. He has no rales.  Abdominal: He exhibits no mass. There is no tenderness. There is no rebound and no guarding.  Musculoskeletal: He exhibits no edema.  Lymphadenopathy:    He has no cervical adenopathy.  Neurological: He is alert and oriented to person, place, and time. Coordination normal.  Skin: Skin is warm and dry. No rash noted.  Psychiatric: He has a normal mood and affect. His behavior is normal.      Assessment & Plan:   Troy Obrien was seen today for uri.  Diagnoses and all orders for this visit:  Seasonal allergic rhinitis  Acute bronchitis, unspecified organism  Other orders -     pseudoephedrine-guaifenesin (MUCINEX D) 60-600 MG per tablet; Take 1 tablet by mouth every 12 (twelve) hours. -     triamcinolone (NASACORT AQ) 55 MCG/ACT AERO nasal inhaler; Place 2 sprays into the nose daily. -     chlorpheniramine-HYDROcodone (TUSSIONEX PENNKINETIC ER) 10-8 MG/5ML SUER; Take 5 mLs by mouth 2 (two) times daily. -     azithromycin (ZITHROMAX) 250 MG tablet; Take 2 tabs PO x 1 dose, then 1 tab PO QD x 4 days  I am  having Troy Obrien start on pseudoephedrine-guaifenesin, triamcinolone, chlorpheniramine-HYDROcodone, and azithromycin. I am also having him maintain his bimatoprost, aspirin, montelukast, levocetirizine, doxazosin, ergocalciferol, meloxicam, HYDROcodone-acetaminophen, Azelastine-Fluticasone, and pravastatin.  Meds ordered this encounter  Medications  . pseudoephedrine-guaifenesin (MUCINEX D) 60-600 MG per tablet    Sig: Take 1 tablet by mouth every 12 (twelve) hours.    Dispense:  18 tablet    Refill:  0  . triamcinolone (NASACORT AQ) 55 MCG/ACT AERO nasal inhaler    Sig: Place 2 sprays into the nose daily.    Dispense:  1 Inhaler    Refill:  12  . chlorpheniramine-HYDROcodone (TUSSIONEX PENNKINETIC ER) 10-8 MG/5ML SUER    Sig: Take 5 mLs by mouth 2 (two) times daily.    Dispense:  60 mL    Refill:  0  . azithromycin (ZITHROMAX) 250 MG tablet    Sig: Take 2 tabs PO x 1 dose, then 1 tab PO QD x  4 days    Dispense:  6 tablet    Refill:  0    Appropriate red flag conditions were discussed with the patient as well as actions that should be taken.  Patient expressed his understanding.  Follow-up: Return if symptoms worsen or fail to improve.  [name], MD

## 2015-07-15 ENCOUNTER — Other Ambulatory Visit: Payer: Self-pay | Admitting: Family Medicine

## 2015-07-27 ENCOUNTER — Other Ambulatory Visit: Payer: Self-pay | Admitting: Family Medicine

## 2015-08-03 ENCOUNTER — Ambulatory Visit (INDEPENDENT_AMBULATORY_CARE_PROVIDER_SITE_OTHER): Payer: BLUE CROSS/BLUE SHIELD | Admitting: Family Medicine

## 2015-08-03 VITALS — BP 158/98 | Ht 66.5 in | Wt 245.8 lb

## 2015-08-03 DIAGNOSIS — G479 Sleep disorder, unspecified: Secondary | ICD-10-CM | POA: Diagnosis not present

## 2015-08-03 DIAGNOSIS — R51 Headache: Secondary | ICD-10-CM | POA: Diagnosis not present

## 2015-08-03 DIAGNOSIS — R519 Headache, unspecified: Secondary | ICD-10-CM

## 2015-08-03 DIAGNOSIS — Z79899 Other long term (current) drug therapy: Secondary | ICD-10-CM | POA: Diagnosis not present

## 2015-08-03 DIAGNOSIS — S4992XA Unspecified injury of left shoulder and upper arm, initial encounter: Secondary | ICD-10-CM

## 2015-08-03 DIAGNOSIS — R5383 Other fatigue: Secondary | ICD-10-CM | POA: Diagnosis not present

## 2015-08-03 DIAGNOSIS — G894 Chronic pain syndrome: Secondary | ICD-10-CM

## 2015-08-03 DIAGNOSIS — J3489 Other specified disorders of nose and nasal sinuses: Secondary | ICD-10-CM | POA: Diagnosis not present

## 2015-08-03 DIAGNOSIS — I10 Essential (primary) hypertension: Secondary | ICD-10-CM | POA: Diagnosis not present

## 2015-08-03 MED ORDER — HYDROCODONE-ACETAMINOPHEN 10-325 MG PO TABS: 1.0000 | ORAL_TABLET | ORAL | Status: DC | PRN

## 2015-08-03 MED ORDER — DOXAZOSIN MESYLATE 4 MG PO TABS: 6.0000 mg | ORAL_TABLET | Freq: Every day | ORAL | Status: DC

## 2015-08-03 MED ORDER — MELOXICAM 15 MG PO TABS: ORAL_TABLET | ORAL | Status: DC

## 2015-08-03 NOTE — Progress Notes (Signed)
Subjective:  This chart was scribed for [name], MD by [facility], medical scribe at [facility].The patient was seen in exam room 02 and the patient's care was started at 7:48 PM.   Patient ID: Troy Obrien, male    DOB: [birth year], 64 y.o.   MRN: [id number] Chief Complaint  Patient presents with  . Shoulder Pain    right shoulder, x 2- 3 weeks  . hot flashes  . Immunizations    flu vaccine   HPI HPI Comments: Troy Obrien is a 64 y.o. male who presents to [facility] complaining of left shoulder pain for the past 2-3 weeks. Developed this pain after increasing carrying load to his left shoulder. Pain is worse at night. No weakness in his grasps. No numbness in his hand. Back pain is fairly stable. Checks BP at home, he does not remember the readings. Recheck in the office was 160/100.  Also complains of a severe headache which is gradually improving. Made an appointment ENT but due to confusion he actually made an appointment with an optometrist. Nasal spray once in the morning and once at night due to congestion. He was taking two different nasal sprays. Saw Dr Jearld Fenton ENT one year ago. Did take sudafed a couple days ago. Weight is up 3 pounds from last visit.   Past Medical History  Diagnosis Date  . Headache(784.0)   . Hypertension   . Hyperlipidemia   . Arthritis   . Inguinal hernia   . Gout   . Glaucoma   . Rickets, fetal   . Vitamin D deficiency    Current Outpatient Prescriptions on File Prior to Visit  Medication Sig Dispense Refill  . aspirin 81 MG tablet Take 81 mg by mouth daily.    . Azelastine-Fluticasone 137-50 MCG/ACT SUSP Place 1 spray into the nose 2 (two) times daily. 1 Bottle 11  . bimatoprost (LUMIGAN) 0.01 % SOLN Place 1 drop into both eyes at bedtime.    Marland Kitchen doxazosin (CARDURA) 4 MG tablet Take 1 tablet (4 mg total) by mouth at bedtime. 90 tablet 3  . HYDROcodone-acetaminophen (NORCO) 10-325 MG per tablet Take 1  tablet by mouth every 4 (four) hours as needed for severe pain. May fill on 05/09/15. 150 tablet 0  . levocetirizine (XYZAL) 5 MG tablet Take 1 tablet (5 mg total) by mouth every evening. 90 tablet 3  . meloxicam (MOBIC) 15 MG tablet TAKE 1 TABLET BY MOUTH EVERY DAY FOR PAIN AND INFLAMMATION 30 tablet 2  . montelukast (SINGULAIR) 10 MG tablet Take 1 tablet (10 mg total) by mouth at bedtime. 90 tablet 3  . pravastatin (PRAVACHOL) 20 MG tablet Take 1 tablet (20 mg total) by mouth at bedtime. 90 tablet 1  . triamcinolone (NASACORT AQ) 55 MCG/ACT AERO nasal inhaler Place 2 sprays into the nose daily. 1 Inhaler 12  . Vitamin D, Ergocalciferol, (DRISDOL) 50000 UNITS CAPS capsule TAKE 1 CAPSULE (50,000 UNITS TOTAL) BY MOUTH ONCE A WEEK. 12 capsule 1  . azithromycin (ZITHROMAX) 250 MG tablet Take 2 tabs PO x 1 dose, then 1 tab PO QD x 4 days (Patient not taking: Reported on 08/03/2015) 6 tablet 0  . chlorpheniramine-HYDROcodone (TUSSIONEX PENNKINETIC ER) 10-8 MG/5ML SUER Take 5 mLs by mouth 2 (two) times daily. (Patient not taking: Reported on 08/03/2015) 60 mL 0  . pseudoephedrine-guaifenesin (MUCINEX D) 60-600 MG per tablet Take 1 tablet by mouth every 12 (twelve) hours. (  Patient not taking: Reported on 08/03/2015) 18 tablet 0   No current facility-administered medications on file prior to visit.   Allergies  Allergen Reactions  . Laxative [Bisacodyl] Hives  . Cyclobenzaprine Other (See Comments)    Hand twitching/spasms   Review of Systems  Constitutional: Positive for diaphoresis and fatigue. Negative for fever, chills, activity change, appetite change and unexpected weight change.  HENT: Positive for congestion and postnasal drip. Negative for nosebleeds, sinus pressure, sneezing, sore throat and trouble swallowing.   Respiratory: Positive for shortness of breath and wheezing. Negative for chest tightness and stridor.   Endocrine: Positive for heat intolerance.  Musculoskeletal: Positive for  myalgias, back pain, arthralgias, neck pain and neck stiffness. Negative for joint swelling and gait problem.  Neurological: Positive for headaches. Negative for dizziness, weakness, light-headedness and numbness.  Psychiatric/Behavioral: Negative for sleep disturbance.   c/o episodic hot flashes/sweats lasting for sev mins but no night sweats    Objective:  Ht 5' 6.5" (1.689 m)  Wt 245 lb 12.8 oz (111.494 kg)  BMI 39.08 kg/m2 Physical Exam  Constitutional: He is oriented to person, place, and time. He appears well-developed and well-nourished. No distress.  HENT:  Head: Normocephalic and atraumatic.  Eyes: Pupils are equal, round, and reactive to light.  Neck: Normal range of motion.  C-spine with no pain with TTP of the c-spine and paraspinal muscles. Negative Spurling's.  Moderate ROM. Normal finger grip test.  Cardiovascular: Normal rate and regular rhythm.   Pulmonary/Chest: Effort normal. No respiratory distress.  Musculoskeletal: Normal range of motion.  Normal clavicle no enlargement of the ac joint. Normal coracoid and chromium. Full ROM of left shoulder alought pain with extreme internal rotation. Normal deltoid, bicep, triceps, wrist extension is 5/5. 5/5 strength with internal and external rotation and empty can test.  Neurological: He is alert and oriented to person, place, and time.  Skin: Skin is warm and dry.  Psychiatric: He has a normal mood and affect. His behavior is normal.  Nursing note and vitals reviewed.      Assessment & Plan:   1. Essential hypertension, benign - increase doxazosin from 4 to 6.  2. Chronic pain syndrome - on chronic narcotics for thoracic back pain with DDD - absolute max dose narcotics - will not increase as pt does have a lot of tolerance issues with rapid escalation of doses prior. Pt frequently has additional injuries or illness that he feels requires additional medications but pt has had some red flags for opioid abuse prior so keep  very strict to pain protocol. Needs UDS at next OV. Had PT 1 1/2 yrs ago for lumbar back pain at [facility] - they reported full function with just mild MSK etiology and rec therapeutic massage - pt reported some worsening of this sxs with PT  3. Shoulder injury, left, initial encounter - exam benign. Likely mSK from heavy lifting - if sxs cont rec RTC for xray and cons PT or cortisone inj.  Try topical like aspercreme  4. Fatigue due to sleep pattern disturbance - highly suspect pt has OSA - refer for eval/ sleep study  5. Nasal obstruction - very severe rhinitis - nasal breathing is always audible from across the room. Has cont on antihistamine and nasal steroid and nasal antihistamine since ENT surg last yr but sxs not improved at all - pt again encouraged to f/u again with ENT so he can recheck on sxs - see if there is anything else  that can be done as this is likely severely exacerbating osa sxs  6. Polypharmacy   7. Nonintractable headache, unspecified chronicity pattern, unspecified headache type     Meds ordered this encounter  Medications  . meloxicam (MOBIC) 15 MG tablet    Sig: TAKE 1 TABLET BY MOUTH EVERY DAY FOR PAIN AND INFLAMMATION    Dispense:  30 tablet    Refill:  2  . HYDROcodone-acetaminophen (NORCO) 10-325 MG tablet    Sig: Take 1 tablet by mouth every 4 (four) hours as needed for severe pain.    Dispense:  150 tablet    Refill:  0    This is a 30d supply. May fill today.  Marland Kitchen HYDROcodone-acetaminophen (NORCO) 10-325 MG tablet    Sig: Take 1 tablet by mouth every 4 (four) hours as needed for moderate pain.    Dispense:  150 tablet    Refill:  0    May fill 30 d from today. This is a 30d supply  . HYDROcodone-acetaminophen (NORCO) 10-325 MG tablet    Sig: Take 1 tablet by mouth every 4 (four) hours as needed for moderate pain.    Dispense:  150 tablet    Refill:  0    May fill in 60d from date written. This is a 30d supply  . doxazosin (CARDURA) 4 MG tablet     Sig: Take 1.5 tablets (6 mg total) by mouth at bedtime.    Dispense:  90 tablet    Refill:  0    I personally performed the services described in this documentation, which was scribed in my presence. The recorded information has been reviewed and considered, and addended by me as needed.  [name], MD MPH   By signing my name below, I, Nadim Abuhashem, attest that this documentation has been prepared under the direction and in the presence of [name], MD.  Electronically Signed: Conchita Paris, medical scribe. 08/03/2015, 7:44 PM.

## 2015-08-03 NOTE — Patient Instructions (Signed)
Your blood pressure is much to high.  Continue monitoring outside of the office and increase your doxazosin to 1 1/2 tabs daily.   MAKE AN APPOINTMENT WITH [facility] - DR. BYERS. YOU NEED A SLEEP STUDY - [facility] (a division of [facility]) is going to contact you.  [facility] Policy for Prescribing Controlled Substances (Revised 08/2012) 1. Prescriptions for controlled substances will be filled by ONE provider at [facility] with whom you have established and developed a plan for your care, including follow-up. 2. You are encouraged to schedule an appointment with your prescriber at our appointment center for follow-up visits whenever possible. 3. If you request a prescription for the controlled substance while at [facility] for an acute problem (with someone other than your regular prescriber), you MAY be given a ONE-TIME prescription for a 30-day supply of the controlled substance, to allow time for you to return to see your regular prescriber for additional prescriptions.   Managing Your High Blood Pressure Blood pressure is a measurement of how forceful your blood is pressing against the walls of the arteries. Arteries are muscular tubes within the circulatory system. Blood pressure does not stay the same. Blood pressure rises when you are active, excited, or nervous; and it lowers during sleep and relaxation. If the numbers measuring your blood pressure stay above normal most of the time, you are at risk for health problems. High blood pressure (hypertension) is a long-term (chronic) condition in which blood pressure is elevated. A blood pressure reading is recorded as two numbers, such as 120 over 80 (or 120/80). The first, higher number is called the systolic pressure. It is a measure of the pressure in your arteries as the heart beats. The second, lower number is called the diastolic pressure. It is a measure of the pressure in your arteries as the heart relaxes between beats.  Keeping  your blood pressure in a normal range is important to your overall health and prevention of health problems, such as heart disease and stroke. When your blood pressure is uncontrolled, your heart has to work harder than normal. High blood pressure is a very common condition in adults because blood pressure tends to rise with age. Men and women are equally likely to have hypertension but at different times in life. Before age 16, men are more likely to have hypertension. After 62 years of age, women are more likely to have it. Hypertension is especially common in African Americans. This condition often has no signs or symptoms. The cause of the condition is usually not known. Your caregiver can help you come up with a plan to keep your blood pressure in a normal, healthy range. BLOOD PRESSURE STAGES Blood pressure is classified into four stages: normal, prehypertension, stage 1, and stage 2. Your blood pressure reading will be used to determine what type of treatment, if any, is necessary. Appropriate treatment options are tied to these four stages:  Normal  Systolic pressure (mm Hg): below 120.  Diastolic pressure (mm Hg): below 80. Prehypertension 1. Systolic pressure (mm Hg): 120 to 139. 2. Diastolic pressure (mm Hg): 80 to 89. Stage1  Systolic pressure (mm Hg): 140 to 159.  Diastolic pressure (mm Hg): 90 to 99. Stage2  Systolic pressure (mm Hg): 160 or above.  Diastolic pressure (mm Hg): 100 or above. RISKS RELATED TO HIGH BLOOD PRESSURE Managing your blood pressure is an important responsibility. Uncontrolled high blood pressure can lead to:  A heart attack.  A stroke.  A  weakened blood vessel (aneurysm).  Heart failure.  Kidney damage.  Eye damage.  Metabolic syndrome.  Memory and concentration problems. HOW TO MANAGE YOUR BLOOD PRESSURE Blood pressure can be managed effectively with lifestyle changes and medicines (if needed). Your caregiver will help you come up with  a plan to bring your blood pressure within a normal range. Your plan should include the following: Education  Read all information provided by your caregivers about how to control blood pressure.  Educate yourself on the latest guidelines and treatment recommendations. New research is always being done to further define the risks and treatments for high blood pressure. Lifestylechanges  Control your weight.  Avoid smoking.  Stay physically active.  Reduce the amount of salt in your diet.  Reduce stress.  Control any chronic conditions, such as high cholesterol or diabetes.  Reduce your alcohol intake. Medicines  Several medicines (antihypertensive medicines) are available, if needed, to bring blood pressure within a normal range. Communication  Review all the medicines you take with your caregiver because there may be side effects or interactions.  Talk with your caregiver about your diet, exercise habits, and other lifestyle factors that may be contributing to high blood pressure.  See your caregiver regularly. Your caregiver can help you create and adjust your plan for managing high blood pressure. RECOMMENDATIONS FOR TREATMENT AND FOLLOW-UP  The following recommendations are based on current guidelines for managing high blood pressure in nonpregnant adults. Use these recommendations to identify the proper follow-up period or treatment option based on your blood pressure reading. You can discuss these options with your caregiver.  Systolic pressure of 120 to 139 or diastolic pressure of 80 to 89: Follow up with your caregiver as directed.  Systolic pressure of 140 to 160 or diastolic pressure of 90 to 100: Follow up with your caregiver within 2 months.  Systolic pressure above 160 or diastolic pressure above 100: Follow up with your caregiver within 1 month.  Systolic pressure above 180 or diastolic pressure above 110: Consider antihypertensive therapy; follow up with your  caregiver within 1 week.  Systolic pressure above 200 or diastolic pressure above 120: Begin antihypertensive therapy; follow up with your caregiver within 1 week. Document Released: 07/11/2012 Document Reviewed: 07/11/2012 [facility] Patient Information 2015 [place], [place]. This information is not intended to replace advice given to you by your health care provider. Make sure you discuss any questions you have with your health care provider.  Sleep Apnea  Sleep apnea is a sleep disorder characterized by abnormal pauses in breathing while you sleep. When your breathing pauses, the level of oxygen in your blood decreases. This causes you to move out of deep sleep and into light sleep. As a result, your quality of sleep is poor, and the system that carries your blood throughout your body (cardiovascular system) experiences stress. If sleep apnea remains untreated, the following conditions can develop:  High blood pressure (hypertension).  Coronary artery disease.  Inability to achieve or maintain an erection (impotence).  Impairment of your thought process (cognitive dysfunction). There are three types of sleep apnea: 3. Obstructive sleep apnea--Pauses in breathing during sleep because of a blocked airway. 4. Central sleep apnea--Pauses in breathing during sleep because the area of the brain that controls your breathing does not send the correct signals to the muscles that control breathing. 5. Mixed sleep apnea--A combination of both obstructive and central sleep apnea. RISK FACTORS The following risk factors can increase your risk of developing sleep apnea:  Being overweight.  Smoking.  Having narrow passages in your nose and throat.  Being of older age.  Being male.  Alcohol use.  Sedative and tranquilizer use.  Ethnicity. Among individuals younger than 35 years, African Americans are at increased risk of sleep apnea. SYMPTOMS   Difficulty staying asleep.  Daytime sleepiness  and fatigue.  Loss of energy.  Irritability.  Loud, heavy snoring.  Morning headaches.  Trouble concentrating.  Forgetfulness.  Decreased interest in sex. DIAGNOSIS  In order to diagnose sleep apnea, your caregiver will perform a physical examination. Your caregiver may suggest that you take a home sleep test. Your caregiver may also recommend that you spend the night in a sleep lab. In the sleep lab, several monitors record information about your heart, lungs, and brain while you sleep. Your leg and arm movements and blood oxygen level are also recorded. TREATMENT The following actions may help to resolve mild sleep apnea:  Sleeping on your side.   Using a decongestant if you have nasal congestion.   Avoiding the use of depressants, including alcohol, sedatives, and narcotics.   Losing weight and modifying your diet if you are overweight. There also are devices and treatments to help open your airway:  Oral appliances. These are custom-made mouthpieces that shift your lower jaw forward and slightly open your bite. This opens your airway.  Devices that create positive airway pressure. This positive pressure "splints" your airway open to help you breathe better during sleep. The following devices create positive airway pressure:  Continuous positive airway pressure (CPAP) device. The CPAP device creates a continuous level of air pressure with an air pump. The air is delivered to your airway through a mask while you sleep. This continuous pressure keeps your airway open.  Nasal expiratory positive airway pressure (EPAP) device. The EPAP device creates positive air pressure as you exhale. The device consists of single-use valves, which are inserted into each nostril and held in place by adhesive. The valves create very little resistance when you inhale but create much more resistance when you exhale. That increased resistance creates the positive airway pressure. This positive  pressure while you exhale keeps your airway open, making it easier to breath when you inhale again.  Bilevel positive airway pressure (BPAP) device. The BPAP device is used mainly in patients with central sleep apnea. This device is similar to the CPAP device because it also uses an air pump to deliver continuous air pressure through a mask. However, with the BPAP machine, the pressure is set at two different levels. The pressure when you exhale is lower than the pressure when you inhale.  Surgery. Typically, surgery is only done if you cannot comply with less invasive treatments or if the less invasive treatments do not improve your condition. Surgery involves removing excess tissue in your airway to create a wider passage way. Document Released: 10/07/2002 Document Revised: 02/11/2013 Document Reviewed: 02/23/2012 [facility] Patient Information 2015 [place], [place]. This information is not intended to replace advice given to you by your health care provider. Make sure you discuss any questions you have with your health care provider.

## 2015-08-30 ENCOUNTER — Other Ambulatory Visit: Payer: Self-pay | Admitting: Family Medicine

## 2015-09-02 ENCOUNTER — Telehealth: Payer: Self-pay

## 2015-09-02 NOTE — Telephone Encounter (Signed)
[facility] Pt has tried Cialis and sildenafil previously. Pending.

## 2015-09-07 MED ORDER — SILDENAFIL CITRATE 100 MG PO TABS: ORAL_TABLET | ORAL | Status: DC

## 2015-09-07 NOTE — Telephone Encounter (Signed)
viagra [facility] - remind pt that he may want to try the goodrx.com website or app to find cheapest place for this and to pull in any on-line coupons. If he goes to [employer] website there is usu a coupon he can print off.

## 2015-09-07 NOTE — Telephone Encounter (Signed)
PA was denied. Pt must first try Viagra. It looks like he tried sildenafil, but not Viagra. Do you want to Rx that?

## 2015-09-15 NOTE — Telephone Encounter (Signed)
Called pt and he reported that he has also tried/failed use of brand Viagra in the past and can not tolerate it due to SE of severe HAs. Advised pt that I will resubmit the PA with this add'l info. Done. Pending again on covermymeds.

## 2015-09-21 NOTE — Telephone Encounter (Signed)
PA approved through 10/30/2038. Notified pharm. 

## 2015-10-22 ENCOUNTER — Telehealth: Payer: Self-pay | Admitting: Family Medicine

## 2015-10-22 ENCOUNTER — Ambulatory Visit (INDEPENDENT_AMBULATORY_CARE_PROVIDER_SITE_OTHER): Payer: BLUE CROSS/BLUE SHIELD | Admitting: Family Medicine

## 2015-10-22 ENCOUNTER — Encounter: Payer: Self-pay | Admitting: Family Medicine

## 2015-10-22 ENCOUNTER — Telehealth: Payer: Self-pay

## 2015-10-22 VITALS — BP 160/80 | HR 51 | Temp 97.2°F | Resp 16 | Ht 66.25 in | Wt 250.0 lb

## 2015-10-22 DIAGNOSIS — I10 Essential (primary) hypertension: Secondary | ICD-10-CM

## 2015-10-22 DIAGNOSIS — G894 Chronic pain syndrome: Secondary | ICD-10-CM | POA: Diagnosis not present

## 2015-10-22 DIAGNOSIS — R0689 Other abnormalities of breathing: Secondary | ICD-10-CM | POA: Diagnosis not present

## 2015-10-22 DIAGNOSIS — N529 Male erectile dysfunction, unspecified: Secondary | ICD-10-CM | POA: Diagnosis not present

## 2015-10-22 DIAGNOSIS — E119 Type 2 diabetes mellitus without complications: Secondary | ICD-10-CM

## 2015-10-22 DIAGNOSIS — E785 Hyperlipidemia, unspecified: Secondary | ICD-10-CM

## 2015-10-22 DIAGNOSIS — Z79899 Other long term (current) drug therapy: Secondary | ICD-10-CM

## 2015-10-22 DIAGNOSIS — M19019 Primary osteoarthritis, unspecified shoulder: Secondary | ICD-10-CM

## 2015-10-22 DIAGNOSIS — E559 Vitamin D deficiency, unspecified: Secondary | ICD-10-CM | POA: Diagnosis not present

## 2015-10-22 DIAGNOSIS — M129 Arthropathy, unspecified: Secondary | ICD-10-CM

## 2015-10-22 DIAGNOSIS — J309 Allergic rhinitis, unspecified: Secondary | ICD-10-CM

## 2015-10-22 LAB — COMPREHENSIVE METABOLIC PANEL
ALK PHOS: 45 U/L (ref 40–115)
ALT: 18 U/L (ref 9–46)
AST: 18 U/L (ref 10–35)
Albumin: 4.2 g/dL (ref 3.6–5.1)
BILIRUBIN TOTAL: 0.5 mg/dL (ref 0.2–1.2)
BUN: 20 mg/dL (ref 7–25)
CO2: 24 mmol/L (ref 20–31)
CREATININE: 1.16 mg/dL (ref 0.70–1.25)
Calcium: 9.2 mg/dL (ref 8.6–10.3)
Chloride: 103 mmol/L (ref 98–110)
GLUCOSE: 82 mg/dL (ref 65–99)
POTASSIUM: 4.2 mmol/L (ref 3.5–5.3)
SODIUM: 136 mmol/L (ref 135–146)
TOTAL PROTEIN: 7.5 g/dL (ref 6.1–8.1)

## 2015-10-22 MED ORDER — HYDROCODONE-ACETAMINOPHEN 10-325 MG PO TABS: 1.0000 | ORAL_TABLET | ORAL | Status: DC | PRN

## 2015-10-22 MED ORDER — AMLODIPINE BESYLATE 5 MG PO TABS: 5.0000 mg | ORAL_TABLET | Freq: Every day | ORAL | Status: DC

## 2015-10-22 NOTE — Progress Notes (Addendum)
Subjective:    Patient ID: Troy Obrien, male    DOB: 1951/02/03, 64 y.o.   MRN: [id number]   Chief Complaint  Patient presents with  . Follow-up    HTN  . Shoulder Pain    LEFT x 2 months  . BUTTOCKS    pain x 2 months    HPI He is no having left shoulder pain that his been bothering him more at night. Hurts when he reaches  He is now having left buttock pain  Went back to the ENT doctor and he was told that there was nothing else that he can do for his severe nasal congestion - nares are always completely closed shut leading to constant noisy breathing and mouth breathing.  Stayed on oral antihistamines xyzal and nasal steroid along with singulair for sev mos without any improvement in sxs at all.  He then went to an allergist for skin test and states that those tests came back negative. Slightly allergic to pollen, mold, dust like most people but nothing sig enough to qualify him for allergy shots or affect his sxs/management. Since he has already had surgery and failed debulking of nares nothing else to offer him.  Possible renal failure with losartan prior.  No change in sxs since we incrased his doxazosin from 4 to 6 at last visit, no orthostatic sxs.  Left shoulder has been much more painful with lifting his amp so had been using more of his pain medicine. His wife is a pt of mine as well - I saw her sev wks ago and she had an acute MSK injury and so had been using some of pt's hydrocodone which they both admitted independently to me so pt is out and requests early refill today. He is on pain contract with me and has not been receiving any controlled substances elsewhere.  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  Past Medical History  Diagnosis Date  . Headache(784.0)   . Hypertension   . Hyperlipidemia   . Arthritis   . Inguinal hernia   . Gout   . Glaucoma   . Rickets, fetal   . Vitamin D deficiency     Past Surgical History  Procedure Laterality Date  . Circumcision  1972  . Tonsillectomy  1963  . Carpel tunnel      bilateral  . Inguinal hernia repair  10/11/2012    Procedure: HERNIA REPAIR INGUINAL ADULT;  Surgeon: Robyne AskewPaul S Toth III, MD;  [facility];  Service: General;  Laterality: Right;  . Insertion of mesh  10/11/2012    Procedure: INSERTION OF MESH;  Surgeon: Robyne AskewPaul S Toth III, MD;  [facility];  Service: General;  Laterality: Right;   Current Outpatient Prescriptions on File Prior to Visit  Medication Sig Dispense Refill  . aspirin 81 MG tablet Take 81 mg by mouth daily.    . Azelastine-Fluticasone 137-50 MCG/ACT SUSP Place 1 spray into the nose 2 (two) times daily. 1 Bottle 11  . bimatoprost (LUMIGAN) 0.01 % SOLN Place 1 drop into both eyes at bedtime.    Marland Kitchen. doxazosin (CARDURA) 4 MG tablet Take 1.5 tablets (6 mg total) by mouth at bedtime. 90 tablet 0  . meloxicam (MOBIC) 15 MG tablet TAKE 1 TABLET BY MOUTH EVERY DAY FOR PAIN AND INFLAMMATION 30 tablet 2  . pravastatin (PRAVACHOL) 20 MG tablet Take 1 tablet (20 mg total) by mouth at  bedtime. 90 tablet 1  . sildenafil (VIAGRA) 100 MG tablet Take 1/4 to 1/2 tablet po qd as needed for erectile dysfunction  30 min to 4 hr prior to intercourse 5 tablet 11  . Vitamin D, Ergocalciferol, (DRISDOL) 50000 UNITS CAPS capsule TAKE 1 CAPSULE (50,000 UNITS TOTAL) BY MOUTH ONCE A WEEK. (Patient not taking: Reported on 10/22/2015) 12 capsule 1   No current facility-administered medications on file prior to visit.   Allergies  Allergen Reactions  . Laxative [Bisacodyl] Hives  . Cyclobenzaprine Other (See Comments)    Hand twitching/spasms   No family history on file. Social History   Social History  . Marital Status: Married    Spouse Name: N/A  . Number of Children: N/A  . Years of Education: N/A   Social History Main Topics  . Smoking status: Never Smoker   . Smokeless tobacco: Never Used  . Alcohol Use: 0.0 oz/week    0  Standard drinks or equivalent per week     Comment: rare  . Drug Use: No  . Sexual Activity: Yes    Birth Control/ Protection: None   Other Topics Concern  . None   Social History Narrative     Review of Systems  Constitutional: Positive for diaphoresis, activity change and fatigue. Negative for fever, chills, appetite change and unexpected weight change.  HENT: Positive for congestion, postnasal drip, rhinorrhea, sinus pressure and sore throat. Negative for dental problem, facial swelling, hearing loss, trouble swallowing and voice change.   Eyes: Negative for visual disturbance.  Respiratory: Negative for cough, chest tightness and shortness of breath.   Cardiovascular: Positive for leg swelling. Negative for chest pain and palpitations.  Gastrointestinal: Negative for abdominal pain, diarrhea and constipation.  Genitourinary: Negative for urgency, frequency, decreased urine volume and difficulty urinating.  Musculoskeletal: Positive for myalgias, back pain, joint swelling, arthralgias, gait problem, neck pain and neck stiffness.  Skin: Negative for color change and wound.  Allergic/Immunologic: Negative for environmental allergies, food allergies and immunocompromised state.  Neurological: Positive for weakness and headaches. Negative for dizziness, light-headedness and numbness.  Hematological: Positive for adenopathy.  Psychiatric/Behavioral: Positive for sleep disturbance.       Objective:  BP 158/79 mmHg  Pulse 51  Temp(Src) 97.2 F (36.2 C) (Oral)  Resp 16  Ht 5' 6.25" (1.683 m)  Wt 250 lb (113.399 kg)  BMI 40.04 kg/m2  SpO2 94%  Recheck BP 139/78  Physical Exam  Constitutional: He is oriented to person, place, and time. He appears well-developed and well-nourished. No distress.  HENT:  Head: Normocephalic and atraumatic.  Right Ear: Tympanic membrane is injected and retracted. Tympanic membrane is not erythematous.  Left Ear: Tympanic membrane is injected and  retracted. Tympanic membrane is not erythematous.  Nose: Mucosal edema and rhinorrhea present. No septal deviation or nasal septal hematoma.  Mouth/Throat: Uvula is midline, oropharynx is clear and moist and mucous membranes are normal. No trismus in the jaw. Normal dentition. No uvula swelling.  Eyes: Conjunctivae are normal. Pupils are equal, round, and reactive to light. No scleral icterus.  Neck: Normal range of motion. Neck supple. No thyromegaly present.  Cardiovascular: Normal rate, regular rhythm, normal heart sounds and intact distal pulses.   Pulmonary/Chest: Effort normal and breath sounds normal. No respiratory distress.  Musculoskeletal: He exhibits no edema.       Right shoulder: He exhibits decreased range of motion, tenderness, pain and spasm. He exhibits no bony tenderness, no effusion, no crepitus and normal strength.  Left shoulder: He exhibits decreased range of motion, tenderness, bony tenderness, crepitus, pain, spasm and decreased strength. He exhibits no swelling, no effusion and normal pulse.  Lymphadenopathy:       Head (right side): No submandibular, no tonsillar, no preauricular and no posterior auricular adenopathy present.       Head (left side): No submandibular, no tonsillar, no preauricular and no posterior auricular adenopathy present.    He has no cervical adenopathy.       Right: No supraclavicular adenopathy present.       Left: No supraclavicular adenopathy present.  Neurological: He is alert and oriented to person, place, and time.  Skin: Skin is warm and dry. He is not diaphoretic.  Psychiatric: He has a normal mood and affect. His behavior is normal.      BP recheck 160/80 by myself - Lt arm, manual, large cuff, stitting    Diabetic Foot Exam - Simple   Simple Foot Form  Diabetic Foot exam was performed with the following findings:  Yes 10/22/2015  3:15 PM  Visual Inspection  Sensation Testing  Pulse Check  Comments      Assessment &  Plan:   1. Diabetes mellitus without complication (HCC) - a1c inc to 6.5 today from 6.1 8 mos prior.  work on tlc. Cont asa. Needs pneumovax at next OV. Eye exam was 10/14/15. Food exam today. Urine microalb done 04/16.Needs lipid panel at next visit  2. Vitamin D deficiency - cont otc supp  3. High risk medication use often uses more than rx'd - reminded pt that he is limited by acetaminophen component  4. Arthropathy of shoulder region   5. Essential hypertension, benign - bp still to elevated despite doxazosin increase from 4 ->6 at least OV so start amlodipine 5 in addition. Try compression socks for pedal edema.  6. Chronic pain syndrome - on pain contract with me - poorly compliant with sig - Today I have utilized the [place] Controlled Substance Registry's online query to confirm compliance regarding the patient's narcotic pain medications. My review reveals appropriate prescription fills and that [facility] is the sole provider of these medications. Rechecks will occur regularly and the patient is aware of our use of the system.  Routine monitoring uds today. Ok to cont meloxicam - reminded of no otc nsaids   7. Erectile dysfunction, unspecified erectile dysfunction type - ok to refill phosphodiesterase inh prn.  8. Allergic rhinitis, unspecified allergic rhinitis type - had ENT surg earlier this yr without any improvement in sxs, no response to singular, zyrtec, nasal steroid after sev mos so stopped. Repeat ENT eval informed him they had nothing more to offer and sent him for allergy testing which was also unrevealing. Do not cont nasal steroid due to severe glaucoma with no sig symptomatic benefit on it.  9. Noisy breathing - from chronic severe rhinitis - consider dental evaluation - Dr. Irene Limbo - perhaps mouth piece could enable better breathing o/n with more restful sleep since cannot breath through nares and large neck so likely OSA from upper airway soft tissue  obstruction.  10.    HPL - pt's complaints of a variety of myalgias/arthralgias did not sig decrease off of statin nor did his use of narcotics lessen so cont pravastatin 20 and reminded to come FASTING to next OV for lipid panel.  Needs zostavax rx at next OV. Needs hep C screen w/ next labs.  Orders Placed This Encounter  Procedures  .  Comprehensive metabolic panel  . VITAMIN D 25 Hydroxy (Vit-D Deficiency, Fractures)  . Hemoglobin A1c  . Prescript Monitor Profile (9)  . HM Diabetes Foot Exam    Meds ordered this encounter  Medications  . HYDROcodone-acetaminophen (NORCO) 10-325 MG tablet    Sig: Take 1 tablet by mouth every 4 (four) hours as needed for moderate pain.    Dispense:  150 tablet    Refill:  0    May fill today. This is a 30d supply  . HYDROcodone-acetaminophen (NORCO) 10-325 MG tablet    Sig: Take 1 tablet by mouth every 4 (four) hours as needed for severe pain.    Dispense:  150 tablet    Refill:  0    This is a 30d supply. May fill on or after 11/21/2015  . HYDROcodone-acetaminophen (NORCO) 10-325 MG tablet    Sig: Take 1 tablet by mouth every 4 (four) hours as needed for moderate pain.    Dispense:  150 tablet    Refill:  0    May fill 12/21/2015. This is a 30d supply  . amLODipine (NORVASC) 5 MG tablet    Sig: Take 1 tablet (5 mg total) by mouth daily.    Dispense:  90 tablet    Refill:  0    [name], MD MPH  Results for orders placed or performed in visit on 10/22/15  Comprehensive metabolic panel  Result Value Ref Range   Sodium 136 135 - 146 mmol/L   Potassium 4.2 3.5 - 5.3 mmol/L   Chloride 103 98 - 110 mmol/L   CO2 24 20 - 31 mmol/L   Glucose, Bld 82 65 - 99 mg/dL   BUN 20 7 - 25 mg/dL   Creat 1.61 0.96 - 0.45 mg/dL   Total Bilirubin 0.5 0.2 - 1.2 mg/dL   Alkaline Phosphatase 45 40 - 115 U/L   AST 18 10 - 35 U/L   ALT 18 9 - 46 U/L   Total Protein 7.5 6.1 - 8.1 g/dL   Albumin 4.2 3.6 - 5.1 g/dL   Calcium 9.2 8.6 - 40.9 mg/dL  VITAMIN  D 25 Hydroxy (Vit-D Deficiency, Fractures)  Result Value Ref Range   Vit D, 25-Hydroxy 31 30 - 100 ng/mL  Hemoglobin A1c  Result Value Ref Range   Hgb A1c MFr Bld 6.5 (H) <5.7 %   Mean Plasma Glucose 140 (H) <117 mg/dL

## 2015-10-22 NOTE — Telephone Encounter (Signed)
Rx clairification

## 2015-10-22 NOTE — Patient Instructions (Addendum)
Dr. Irene Limbo does mouth pieces for sleep apnea - check out this dentist, I think that a mouth piece might help you sleep better overnight and wake well rested.  Check out compression socks.  The [employer] brand and Go2 compression socks on [place] are great. You would want to get a light to medium grade (5-55mmHg type).  Start the amlodipine for blood pressure.  Come to the next visit around your birthday FASTING. It is time to recheck your cholesterol.   STOP EATING SALT.  Peripheral Edema You have swelling in your legs (peripheral edema). This swelling is due to excess accumulation of salt and water in your body. Edema may be a sign of heart, kidney or liver disease, or a side effect of a medication. It may also be due to problems in the leg veins. Elevating your legs and using special support stockings may be very helpful, if the cause of the swelling is due to poor venous circulation. Avoid long periods of standing, whatever the cause. Treatment of edema depends on identifying the cause. Chips, pretzels, pickles and other salty foods should be avoided. Restricting salt in your diet is almost always needed. Water pills (diuretics) are often used to remove the excess salt and water from your body via urine. These medicines prevent the kidney from reabsorbing sodium. This increases urine flow. Diuretic treatment may also result in lowering of potassium levels in your body. Potassium supplements may be needed if you have to use diuretics daily. Daily weights can help you keep track of your progress in clearing your edema. You should call your caregiver for follow up care as recommended. SEEK IMMEDIATE MEDICAL CARE IF:   You have increased swelling, pain, redness, or heat in your legs.  You develop shortness of breath, especially when lying down.  You develop chest or abdominal pain, weakness, or fainting.  You have a fever.   This information is not intended to replace advice given to you  by your health care provider. Make sure you discuss any questions you have with your health care provider.   Document Released: 11/24/2004 Document Revised: 01/09/2012 Document Reviewed: 04/29/2015 Elsevier Interactive Patient Education 2016 [employer]   DASH Eating Plan DASH stands for "Dietary Approaches to Stop Hypertension." The DASH eating plan is a healthy eating plan that has been shown to reduce high blood pressure (hypertension). Additional health benefits may include reducing the risk of type 2 diabetes mellitus, heart disease, and stroke. The DASH eating plan may also help with weight loss. WHAT DO I NEED TO KNOW ABOUT THE DASH EATING PLAN? For the DASH eating plan, you will follow these general guidelines:  Choose foods with a percent daily value for sodium of less than 5% (as listed on the food label).  Use salt-free seasonings or herbs instead of table salt or sea salt.  Check with your health care provider or pharmacist before using salt substitutes.  Eat lower-sodium products, often labeled as "lower sodium" or "no salt added."  Eat fresh foods.  Eat more vegetables, fruits, and low-fat dairy products.  Choose whole grains. Look for the word "whole" as the first word in the ingredient list.  Choose fish and skinless chicken or [place] more often than red meat. Limit fish, poultry, and meat to 6 oz (170 g) each day.  Limit sweets, desserts, sugars, and sugary drinks.  Choose heart-healthy fats.  Limit cheese to 1 oz (28 g) per day.  Eat more home-cooked food and less restaurant, buffet,  and fast food.  Limit fried foods.  Cook foods using methods other than frying.  Limit canned vegetables. If you do use them, rinse them well to decrease the sodium.  When eating at a restaurant, ask that your food be prepared with less salt, or no salt if possible. WHAT FOODS CAN I EAT? Seek help from a dietitian for individual calorie needs. Grains Whole grain or whole  wheat bread. Brown rice. Whole grain or whole wheat pasta. Quinoa, bulgur, and whole grain cereals. Low-sodium cereals. Corn or whole wheat flour tortillas. Whole grain cornbread. Whole grain crackers. Low-sodium crackers. Vegetables Fresh or frozen vegetables (raw, steamed, roasted, or grilled). Low-sodium or reduced-sodium tomato and vegetable juices. Low-sodium or reduced-sodium tomato sauce and paste. Low-sodium or reduced-sodium canned vegetables.  Fruits All fresh, canned (in natural juice), or frozen fruits. Meat and Other Protein Products Ground beef (85% or leaner), grass-fed beef, or beef trimmed of fat. Skinless chicken or [place]. Ground chicken or [place]. Pork trimmed of fat. All fish and seafood. Eggs. Dried beans, peas, or lentils. Unsalted nuts and seeds. Unsalted canned beans. Dairy Low-fat dairy products, such as skim or 1% milk, 2% or reduced-fat cheeses, low-fat ricotta or cottage cheese, or plain low-fat yogurt. Low-sodium or reduced-sodium cheeses. Fats and Oils Tub margarines without trans fats. Light or reduced-fat mayonnaise and salad dressings (reduced sodium). Avocado. Safflower, olive, or canola oils. Natural peanut or almond butter. Other Unsalted popcorn and pretzels. The items listed above may not be a complete list of recommended foods or beverages. Contact your dietitian for more options. WHAT FOODS ARE NOT RECOMMENDED? Grains White bread. White pasta. White rice. Refined cornbread. Bagels and croissants. Crackers that contain trans fat. Vegetables Creamed or fried vegetables. Vegetables in a cheese sauce. Regular canned vegetables. Regular canned tomato sauce and paste. Regular tomato and vegetable juices. Fruits Dried fruits. Canned fruit in light or heavy syrup. Fruit juice. Meat and Other Protein Products Fatty cuts of meat. Ribs, chicken wings, bacon, sausage, bologna, salami, chitterlings, fatback, hot dogs, bratwurst, and packaged luncheon meats. Salted  nuts and seeds. Canned beans with salt. Dairy Whole or 2% milk, cream, half-and-half, and cream cheese. Whole-fat or sweetened yogurt. Full-fat cheeses or blue cheese. Nondairy creamers and whipped toppings. Processed cheese, cheese spreads, or cheese curds. Condiments Onion and garlic salt, seasoned salt, table salt, and sea salt. Canned and packaged gravies. Worcestershire sauce. Tartar sauce. Barbecue sauce. Teriyaki sauce. Soy sauce, including reduced sodium. Steak sauce. Fish sauce. Oyster sauce. Cocktail sauce. Horseradish. Ketchup and mustard. Meat flavorings and tenderizers. Bouillon cubes. Hot sauce. Tabasco sauce. Marinades. Taco seasonings. Relishes. Fats and Oils Butter, stick margarine, lard, shortening, ghee, and bacon fat. Coconut, palm kernel, or palm oils. Regular salad dressings. Other Pickles and olives. Salted popcorn and pretzels. The items listed above may not be a complete list of foods and beverages to avoid. Contact your dietitian for more information. WHERE CAN I FIND MORE INFORMATION? National Heart, [employer], and [employer] Institute: [employer]   This information is not intended to replace advice given to you by your health care provider. Make sure you discuss any questions you have with your health care provider.   Document Released: 10/06/2011 Document Revised: 11/07/2014 Document Reviewed: 08/21/2013 Elsevier Interactive Patient Education 2016 Else[employer] Inc.  Shoulder Exercises EXERCISES  RANGE OF MOTION (ROM) AND STRETCHING EXERCISES These exercises may help you when beginning to rehabilitate your injury. Your symptoms may resolve with or without further involvement from your physician, physical therapist or athletic  trainer. While completing these exercises, remember:   Restoring tissue flexibility helps normal motion to return to the joints. This allows healthier, less painful movement and activity.  An effective stretch  should be held for at least 30 seconds.  A stretch should never be painful. You should only feel a gentle lengthening or release in the stretched tissue. ROM - Pendulum  Bend at the waist so that your right / left arm falls away from your body. Support yourself with your opposite hand on a solid surface, such as a table or a countertop.  Your right / left arm should be perpendicular to the ground. If it is not perpendicular, you need to lean over farther. Relax the muscles in your right / left arm and shoulder as much as possible.  Gently sway your hips and trunk so they move your right / left arm without any use of your right / left shoulder muscles.  Progress your movements so that your right / left arm moves side to side, then forward and backward, and finally, both clockwise and counterclockwise.  Complete __________ repetitions in each direction. Many people use this exercise to relieve discomfort in their shoulder as well as to gain range of motion. Repeat __________ times. Complete this exercise __________ times per day. STRETCH - Flexion, Standing  Stand with good posture. With an underhand grip on your right / left hand and an overhand grip on the opposite hand, grasp a broomstick or cane so that your hands are a little more than shoulder-width apart.  Keeping your right / left elbow straight and shoulder muscles relaxed, push the stick with your opposite hand to raise your right / left arm in front of your body and then overhead. Raise your arm until you feel a stretch in your right / left shoulder, but before you have increased shoulder pain.  Try to avoid shrugging your right / left shoulder as your arm rises by keeping your shoulder blade tucked down and toward your mid-back spine. Hold __________ seconds.  Slowly return to the starting position. Repeat __________ times. Complete this exercise __________ times per day. STRETCH - Internal Rotation  Place your right / left hand  behind your back, palm-up.  Throw a towel or belt over your opposite shoulder. Grasp the towel/belt with your right / left hand.  While keeping an upright posture, gently pull up on the towel/belt until you feel a stretch in the front of your right / left shoulder.  Avoid shrugging your right / left shoulder as your arm rises by keeping your shoulder blade tucked down and toward your mid-back spine.  Hold __________. Release the stretch by lowering your opposite hand. Repeat __________ times. Complete this exercise __________ times per day. STRETCH - External Rotation and Abduction  Stagger your stance through a doorframe. It does not matter which foot is forward.  As instructed by your physician, physical therapist or athletic trainer, place your hands:  And forearms above your head and on the door frame.  And forearms at head-height and on the door frame.  At elbow-height and on the door frame.  Keeping your head and chest upright and your stomach muscles tight to prevent over-extending your low-back, slowly shift your weight onto your front foot until you feel a stretch across your chest and/or in the front of your shoulders.  Hold __________ seconds. Shift your weight to your back foot to release the stretch. Repeat __________ times. Complete this stretch __________ times per day.  STRENGTHENING EXERCISES  These exercises may help you when beginning to rehabilitate your injury. They may resolve your symptoms with or without further involvement from your physician, physical therapist or athletic trainer. While completing these exercises, remember:   Muscles can gain both the endurance and the strength needed for everyday activities through controlled exercises.  Complete these exercises as instructed by your physician, physical therapist or athletic trainer. Progress the resistance and repetitions only as guided.  You may experience muscle soreness or fatigue, but the pain or  discomfort you are trying to eliminate should never worsen during these exercises. If this pain does worsen, stop and make certain you are following the directions exactly. If the pain is still present after adjustments, discontinue the exercise until you can discuss the trouble with your clinician.  If advised by your physician, during your recovery, avoid activity or exercises which involve actions that place your right / left hand or elbow above your head or behind your back or head. These positions stress the tissues which are trying to heal. STRENGTH - Scapular Depression and Adduction  With good posture, sit on a firm chair. Supported your arms in front of you with pillows, arm rests or a table top. Have your elbows in line with the sides of your body.  Gently draw your shoulder blades down and toward your mid-back spine. Gradually increase the tension without tensing the muscles along the top of your shoulders and the back of your neck.  Hold for __________ seconds. Slowly release the tension and relax your muscles completely before completing the next repetition.  After you have practiced this exercise, remove the arm support and complete it in standing as well as sitting. Repeat __________ times. Complete this exercise __________ times per day.  STRENGTH - External Rotators  Secure a rubber exercise band/tubing to a fixed object so that it is at the same height as your right / left elbow when you are standing or sitting on a firm surface.  Stand or sit so that the secured exercise band/tubing is at your side that is not injured.  Bend your elbow 90 degrees. Place a folded towel or small pillow under your right / left arm so that your elbow is a few inches away from your side.  Keeping the tension on the exercise band/tubing, pull it away from your body, as if pivoting on your elbow. Be sure to keep your body steady so that the movement is only coming from your shoulder rotating.  Hold  __________ seconds. Release the tension in a controlled manner as you return to the starting position. Repeat __________ times. Complete this exercise __________ times per day.  STRENGTH - Supraspinatus  Stand or sit with good posture. Grasp a __________ weight or an exercise band/tubing so that your hand is "thumbs-up," like when you shake hands.  Slowly lift your right / left hand from your thigh into the air, traveling about 30 degrees from straight out at your side. Lift your hand to shoulder height or as far as you can without increasing any shoulder pain. Initially, many people do not lift their hands above shoulder height.  Avoid shrugging your right / left shoulder as your arm rises by keeping your shoulder blade tucked down and toward your mid-back spine.  Hold for __________ seconds. Control the descent of your hand as you slowly return to your starting position. Repeat __________ times. Complete this exercise __________ times per day.  STRENGTH - Shoulder Extensors  Secure  a rubber exercise band/tubing so that it is at the height of your shoulders when you are either standing or sitting on a firm arm-less chair.  With a thumbs-up grip, grasp an end of the band/tubing in each hand. Straighten your elbows and lift your hands straight in front of you at shoulder height. Step back away from the secured end of band/tubing until it becomes tense.  Squeezing your shoulder blades together, pull your hands down to the sides of your thighs. Do not allow your hands to go behind you.  Hold for __________ seconds. Slowly ease the tension on the band/tubing as you reverse the directions and return to the starting position. Repeat __________ times. Complete this exercise __________ times per day.  STRENGTH - Scapular Retractors  Secure a rubber exercise band/tubing so that it is at the height of your shoulders when you are either standing or sitting on a firm arm-less chair.  With a palm-down  grip, grasp an end of the band/tubing in each hand. Straighten your elbows and lift your hands straight in front of you at shoulder height. Step back away from the secured end of band/tubing until it becomes tense.  Squeezing your shoulder blades together, draw your elbows back as you bend them. Keep your upper arm lifted away from your body throughout the exercise.  Hold __________ seconds. Slowly ease the tension on the band/tubing as you reverse the directions and return to the starting position. Repeat __________ times. Complete this exercise __________ times per day. STRENGTH - Scapular Depressors  Find a sturdy chair without wheels, such as a from a dining room table.  Keeping your feet on the floor, lift your bottom from the seat and lock your elbows.  Keeping your elbows straight, allow gravity to pull your body weight down. Your shoulders will rise toward your ears.  Raise your body against gravity by drawing your shoulder blades down your back, shortening the distance between your shoulders and ears. Although your feet should always maintain contact with the floor, your feet should progressively support less body weight as you get stronger.  Hold __________ seconds. In a controlled and slow manner, lower your body weight to begin the next repetition. Repeat __________ times. Complete this exercise __________ times per day.    This information is not intended to replace advice given to you by your health care provider. Make sure you discuss any questions you have with your health care provider.   Document Released: 08/31/2005 Document Revised: 11/07/2014 Document Reviewed: 01/29/2009 Elsevier Interactive Patient Education [employer]

## 2015-10-22 NOTE — Telephone Encounter (Signed)
Thanks allie - very thorough!! Yes ok to fill today, thanks

## 2015-10-22 NOTE — Telephone Encounter (Signed)
Pharmacy called regarding hydrocodone rx dated 10/22/15 and states may fill today this is a 30d supply. His last hydrocodone rx was filled 12/02 30d supply and patient states he is out. He would not be due for a refill until 12/30. Looking at all 3 rx given 10/22/15 it looks like the dates following corollate with having rx filled now. I reviewed note and did not see any documentation regarding an early refill so I told pharmacist we would send message to you to make sure he could fill early. please advise?

## 2015-10-23 LAB — HEMOGLOBIN A1C
Hgb A1c MFr Bld: 6.5 % — ABNORMAL HIGH (ref <5.7)
Mean Plasma Glucose: 140 mg/dL — ABNORMAL HIGH (ref <117)

## 2015-10-23 LAB — VITAMIN D 25 HYDROXY (VIT D DEFICIENCY, FRACTURES): VIT D 25 HYDROXY: 31 ng/mL (ref 30–100)

## 2015-10-23 NOTE — Telephone Encounter (Signed)
Pharmacy notified.

## 2015-10-23 NOTE — Telephone Encounter (Signed)
I called [facility] to ok early refill for pt per Dr. Clelia CroftShaw.

## 2015-10-27 ENCOUNTER — Encounter: Payer: Self-pay | Admitting: Family Medicine

## 2015-10-27 LAB — OPIATES/OPIOIDS (LC/MS-MS)
Codeine Urine: NEGATIVE ng/mL
Hydrocodone: 11078 ng/mL — AB
Hydromorphone: 2629 ng/mL — AB
Morphine Urine: NEGATIVE ng/mL
Norhydrocodone, Ur: 9470 ng/mL — AB
Noroxycodone, Ur: NEGATIVE ng/mL
Oxycodone, ur: NEGATIVE ng/mL
Oxymorphone: NEGATIVE ng/mL

## 2015-10-27 MED ORDER — PRAVASTATIN SODIUM 20 MG PO TABS: 20.0000 mg | ORAL_TABLET | Freq: Every day | ORAL | Status: DC

## 2015-10-27 MED ORDER — MELOXICAM 15 MG PO TABS: ORAL_TABLET | ORAL | Status: DC

## 2015-10-27 NOTE — Addendum Note (Signed)
Addended by: Norberto SorensonSHAW, EVA on: 10/27/2015 11:39 AM   Modules accepted: Orders, Medications, SmartSet

## 2015-10-28 LAB — PRESCRIPTION MONITORING PROFILE (9 PANEL)
AMPHETAMINE/METH: NEGATIVE ng/mL
BARBITURATE SCREEN, URINE: NEGATIVE ng/mL
BENZODIAZEPINE SCREEN, URINE: NEGATIVE ng/mL
CREATININE, URINE: 203 mg/dL (ref >20.0)
Cannabinoid Scrn, Ur: NEGATIVE ng/mL
Cocaine Metabolites: NEGATIVE ng/mL
METHADONE SCREEN, URINE: NEGATIVE ng/mL
Nitrites, Initial: NEGATIVE ug/mL
OXYCODONE SCRN UR: NEGATIVE ng/mL
PH URINE, INITIAL: 5.3 pH (ref 4.5–8.9)
Propoxyphene: NEGATIVE ng/mL

## 2015-11-12 ENCOUNTER — Telehealth: Payer: Self-pay | Admitting: Family Medicine

## 2015-11-12 NOTE — Telephone Encounter (Signed)
Patient has an appointment on 01/14/2016 at 4:15pm. He wants to know if he should fast.   [phone]

## 2015-11-12 NOTE — Telephone Encounter (Signed)
Yes. Pt notified.

## 2015-11-29 ENCOUNTER — Ambulatory Visit (INDEPENDENT_AMBULATORY_CARE_PROVIDER_SITE_OTHER): Payer: Managed Care, Other (non HMO) | Admitting: Family Medicine

## 2015-11-29 VITALS — BP 140/80 | HR 57 | Temp 98.1°F | Resp 20 | Ht 66.54 in | Wt 245.8 lb

## 2015-11-29 DIAGNOSIS — B009 Herpesviral infection, unspecified: Secondary | ICD-10-CM | POA: Diagnosis not present

## 2015-11-29 DIAGNOSIS — K121 Other forms of stomatitis: Secondary | ICD-10-CM | POA: Diagnosis not present

## 2015-11-29 MED ORDER — MAGIC MOUTHWASH W/LIDOCAINE: 5.0000 mL | Freq: Four times a day (QID) | ORAL | Status: DC | PRN

## 2015-11-29 MED ORDER — OXYCODONE-ACETAMINOPHEN 5-325 MG PO TABS: 1.0000 | ORAL_TABLET | Freq: Three times a day (TID) | ORAL | Status: DC | PRN

## 2015-11-29 MED ORDER — VALACYCLOVIR HCL 1 G PO TABS: ORAL_TABLET | ORAL | Status: DC

## 2015-11-29 NOTE — Progress Notes (Signed)
Chief Complaint:  Chief Complaint  Patient presents with  . Facial Pain    x 1 week    HPI: Troy Obrien is a 65 y.o. male who reports to [facility] today complaining of  Mouth pain and left sided jaw pain . He had it 1 week ago.  He has tried gargling with H202 and hurts so bad he can't eat or drink. He has a hx of fever blisters several times a year. He has had a viral infection/cold and right after that he had ulcers in his mouth   Lab Results  Component Value Date   HGBA1C 6.5* 10/22/2015   HGBA1C 6.1 02/18/2015   HGBA1C 6.0 08/17/2014   Lab Results  Component Value Date   MICROALBUR 2.9* 02/01/2015   MICROALBUR 2.9* 02/01/2015   LDLCALC 94 08/17/2014   CREATININE 1.16 10/22/2015     Past Medical History  Diagnosis Date  . Headache(784.0)   . Hypertension   . Hyperlipidemia   . Arthritis   . Inguinal hernia   . Gout   . Glaucoma   . Rickets, fetal   . Vitamin D deficiency    Past Surgical History  Procedure Laterality Date  . Circumcision  1972  . Tonsillectomy  1963  . Carpel tunnel      bilateral  . Inguinal hernia repair  10/11/2012    Procedure: HERNIA REPAIR INGUINAL ADULT;  Surgeon: Robyne Askew, MD;  Location: [facility] OR;  Service: General;  Laterality: Right;  . Insertion of mesh  10/11/2012    Procedure: INSERTION OF MESH;  Surgeon: Robyne Askew, MD;  Location: [facility] OR;  Service: General;  Laterality: Right;   Social History   Social History  . Marital Status: Married    Spouse Name: N/A  . Number of Children: N/A  . Years of Education: N/A   Social History Main Topics  . Smoking status: Never Smoker   . Smokeless tobacco: Never Used  . Alcohol Use: 0.0 oz/week    0 Standard drinks or equivalent per week     Comment: rare  . Drug Use: No  . Sexual Activity: Yes    Birth Control/ Protection: None   Other Topics Concern  . None   Social History Narrative   History reviewed. No pertinent family history. Allergies  Allergen  Reactions  . Laxative [Bisacodyl] Hives  . Cyclobenzaprine Other (See Comments)    Hand twitching/spasms   Prior to Admission medications   Medication Sig Start Date End Date Taking? Authorizing Provider  amLODipine (NORVASC) 5 MG tablet Take 1 tablet (5 mg total) by mouth daily. 10/22/15  Yes Sherren Mocha, MD  aspirin 81 MG tablet Take 81 mg by mouth daily.   Yes Historical Provider, MD  bimatoprost (LUMIGAN) 0.01 % SOLN Place 1 drop into both eyes at bedtime.   Yes Historical Provider, MD  doxazosin (CARDURA) 4 MG tablet Take 1.5 tablets (6 mg total) by mouth at bedtime. 08/03/15  Yes Sherren Mocha, MD  HYDROcodone-acetaminophen (NORCO) 10-325 MG tablet Take 1 tablet by mouth every 4 (four) hours as needed for moderate pain. 10/22/15  Yes Sherren Mocha, MD  HYDROcodone-acetaminophen (NORCO) 10-325 MG tablet Take 1 tablet by mouth every 4 (four) hours as needed for severe pain. 10/22/15  Yes Sherren Mocha, MD  HYDROcodone-acetaminophen (NORCO) 10-325 MG tablet Take 1 tablet by mouth every 4 (four) hours as needed for moderate pain. 10/22/15  Yes  Sherren Mocha, MD  meloxicam (MOBIC) 15 MG tablet TAKE 1 TABLET BY MOUTH EVERY DAY FOR PAIN AND INFLAMMATION 10/27/15  Yes Sherren Mocha, MD  pravastatin (PRAVACHOL) 20 MG tablet Take 1 tablet (20 mg total) by mouth at bedtime. 10/27/15  Yes Sherren Mocha, MD  sildenafil (VIAGRA) 100 MG tablet Take 1/4 to 1/2 tablet po qd as needed for erectile dysfunction  30 min to 4 hr prior to intercourse 09/07/15  Yes Sherren Mocha, MD  Azelastine-Fluticasone 137-50 MCG/ACT SUSP Place 1 spray into the nose 2 (two) times daily. Patient not taking: Reported on 11/29/2015 05/09/15   Sherren Mocha, MD     ROS: The patient denies fevers, chills, night sweats, unintentional weight loss, chest pain, palpitations, wheezing, dyspnea on exertion, nausea, vomiting, abdominal pain, dysuria, hematuria, melena, numbness, weakness, or tingling.   All other systems have been reviewed and were otherwise  negative with the exception of those mentioned in the HPI and as above.    PHYSICAL EXAM: Filed Vitals:   11/29/15 1620  BP: 140/80  Pulse: 57  Temp: 98.1 F (36.7 C)  Resp: 20   Body mass index is 39.04 kg/(m^2).   General: Alert, mild to moderate distress HEENT:  Normocephalic, atraumatic, oropharynx patent. EOMI, PERRLA, TM normal  + left sided mouth ulcer near molars Cardiovascular:  Regular rate and rhythm, no rubs murmurs or gallops.  No Carotid bruits, radial pulse intact. No pedal edema.  Respiratory: Clear to auscultation bilaterally.  No wheezes, rales, or rhonchi.  No cyanosis, no use of accessory musculature Abdominal: No organomegaly, abdomen is soft and non-tender, positive bowel sounds. No masses. Skin: No rashes. Neurologic: Facial musculature symmetric. Psychiatric: Patient acts appropriately throughout our interaction. Lymphatic: No cervical or submandibular lymphadenopathy Musculoskeletal: Gait intact. No edema, tenderness   LABS: Results for orders placed or performed in visit on 10/22/15  Comprehensive metabolic panel  Result Value Ref Range   Sodium 136 135 - 146 mmol/L   Potassium 4.2 3.5 - 5.3 mmol/L   Chloride 103 98 - 110 mmol/L   CO2 24 20 - 31 mmol/L   Glucose, Bld 82 65 - 99 mg/dL   BUN 20 7 - 25 mg/dL   Creat 1.09 6.04 - 5.40 mg/dL   Total Bilirubin 0.5 0.2 - 1.2 mg/dL   Alkaline Phosphatase 45 40 - 115 U/L   AST 18 10 - 35 U/L   ALT 18 9 - 46 U/L   Total Protein 7.5 6.1 - 8.1 g/dL   Albumin 4.2 3.6 - 5.1 g/dL   Calcium 9.2 8.6 - 98.1 mg/dL  VITAMIN D 25 Hydroxy (Vit-D Deficiency, Fractures)  Result Value Ref Range   Vit D, 25-Hydroxy 31 30 - 100 ng/mL  Hemoglobin A1c  Result Value Ref Range   Hgb A1c MFr Bld 6.5 (H) <5.7 %   Mean Plasma Glucose 140 (H) <117 mg/dL  Prescript Monitor Profile (9)  Result Value Ref Range   Creatinine, Urine 203.00 >20.0 mg/dL   pH, Initial 5.3 4.5 - 8.9 pH   Nitrites, Initial NEG Cutoff:200 ug/mL    Amphetamine/Meth NEG Cutoff:500 ng/mL   Barbiturate Screen, Urine NEG Cutoff:200 ng/mL   Benzodiazepine Screen, Urine NEG Cutoff:100 ng/mL   Cannabinoid Scrn, Ur NEG Cutoff:50 ng/mL   Cocaine Metabolites NEG Cutoff:150 ng/mL   Methadone Screen, Urine NEG Cutoff:300 ng/mL   Oxycodone Screen, Ur NEG Cutoff:100 ng/mL   Propoxyphene NEG Cutoff:300 ng/mL   Opiate Screen, Urine PPS Cutoff:100 ng/mL  Prescribed Drug 1 NONE PROVIDED   Opiates/Opioids (LC/MS-MS)  Result Value Ref Range   Codeine Urine NEG <50 ng/mL   Hydrocodone 16109 (A) <50 ng/mL   Hydromorphone 2629 (A) <50 ng/mL   Morphine Urine NEG <50 ng/mL   Norhydrocodone, Ur 9470 (A) <50 ng/mL   Noroxycodone, Ur NEG <50 ng/mL   Oxycodone, ur NEG <50 ng/mL   Oxymorphone NEG <50 ng/mL     EKG/XRAY:   Primary read interpreted by Dr. Conley Rolls at [facility].   ASSESSMENT/PLAN: Encounter Diagnoses  Name Primary?  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 Mouth ulcers    Valtrex 2 gram BID x 2 doses and since he has Dm that is diet controlled and HbA1c was 6.5 I will go ahead and  Give him an extension of the  Valtrex for immunocomprise Rx magic mouthwash with lidocaine He may use the Roxicet for pain, he is already on norco but should stop that while on roxicet [place] controlled substance DB pulled, he is disclosing his use of norco to me Fu prn   Gross sideeffects, risk and benefits, and alternatives of medications d/w patient. Patient is aware that all medications have potential sideeffects and we are unable to predict every sideeffect or drug-drug interaction that may occur.  Denise Bramblett DO  11/29/2015 5:04 PM

## 2015-11-29 NOTE — Patient Instructions (Signed)
Cold Sore A cold sore (fever blister) is a skin infection caused by the herpes simplex virus (HSV-1). HSV-1 is closely related to the virus that causes genital herpes (HSV-2), but they are not the same even though both viruses can cause oral and genital infections. Cold sores are small, fluid-filled sores inside of the mouth or on the lips, gums, nose, chin, cheeks, or fingers.  The herpes simplex virus can be easily passed (contagious) to other people through close personal contact, such as kissing or sharing personal items. The virus can also spread to other parts of the body, such as the eyes or genitals. Cold sores are contagious until the sores crust over completely. They often heal within 2 weeks.  Once a person is infected, the herpes simplex virus remains permanently in the body. Therefore, there is no cure for cold sores, and they often recur when a person is tired, stressed, sick, or gets too much sun. Additional factors that can cause a recurrence include hormone changes in menstruation or pregnancy, certain drugs, and cold weather.  CAUSES  Cold sores are caused by the herpes simplex virus. The virus is spread from person to person through close contact, such as through kissing, touching the affected area, or sharing personal items such as lip balm, razors, or eating utensils.  SYMPTOMS  The first infection may not cause symptoms. If symptoms develop, the symptoms often go through different stages. Here is how a cold sore develops:   Tingling, itching, or burning is felt 1-2 days before the outbreak.   Fluid-filled blisters appear on the lips, inside the mouth, nose, or on the cheeks.   The blisters start to ooze clear fluid.   The blisters dry up and a yellow crust appears in its place.   The crust falls off.  Symptoms depend on whether it is the initial outbreak or a recurrence. Some other symptoms with the first outbreak may include:   Fever.   Sore throat.   Headache.    Muscle aches.   Swollen neck glands.  DIAGNOSIS  A diagnosis is often made based on your symptoms and looking at the sores. Sometimes, a sore may be swabbed and then examined in the lab to make a final diagnosis. If the sores are not present, blood tests can find the herpes simplex virus.  TREATMENT  There is no cure for cold sores and no vaccine for the herpes simplex virus. Within 2 weeks, most cold sores go away on their own without treatment. Medicines cannot make the infection go away, but medicine can help relieve some of the pain associated with the sores, can work to stop the virus from multiplying, and can also shorten healing time. Medicine may be in the form of creams, gels, pills, or a shot.  HOME CARE INSTRUCTIONS   Only take over-the-counter or prescription medicines for pain, discomfort, or fever as directed by your caregiver. Do not use aspirin.   Use a cotton-tip swab to apply creams or gels to your sores.   Do not touch the sores or pick the scabs. Wash your hands often. Do not touch your eyes without washing your hands first.   Avoid kissing, oral sex, and sharing personal items until sores heal.   Apply an ice pack on your sores for 10-15 minutes to ease any discomfort.   Avoid hot, cold, or salty foods because they may hurt your mouth. Eat a soft, bland diet to avoid irritating the sores. Use a straw to drink   if you have pain when drinking out of a glass.   Keep sores clean and dry to prevent an infection of other tissues.   Avoid the sun and limit stress if these things trigger outbreaks. If sun causes cold sores, apply sunscreen on the lips before being out in the sun.  SEEK MEDICAL CARE IF:   You have a fever or persistent symptoms for more than 2-3 days.   You have a fever and your symptoms suddenly get worse.   You have pus, not clear fluid, coming from the sores.   You have redness that is spreading.   You have pain or irritation in your  eye.   You get sores on your genitals.   Your sores do not heal within 2 weeks.   You have a weakened immune system.   You have frequent recurrences of cold sores.  MAKE SURE YOU:   Understand these instructions.  Will watch your condition.  Will get help right away if you are not doing well or get worse.   This information is not intended to replace advice given to you by your health care provider. Make sure you discuss any questions you have with your health care provider.   Document Released: 10/14/2000 Document Revised: 11/07/2014 Document Reviewed: 02/29/2012 Elsevier Interactive Patient Education 2016 [employer] Inc[employer]  

## 2015-12-02 ENCOUNTER — Other Ambulatory Visit: Payer: Self-pay | Admitting: Family Medicine

## 2015-12-03 ENCOUNTER — Telehealth: Payer: Self-pay

## 2015-12-03 NOTE — Telephone Encounter (Signed)
Patient called to request a new referral from Dr Clelia Croft for a dentist.  I do not see a referral for a dentist, so it may have been verbal.  He said the location she referred him to does not accept insurance, so he needs to be referred to another facility.  Please advise, thank you.  CB#: [phone]

## 2015-12-04 NOTE — Telephone Encounter (Signed)
I do not have any recollection of this. As far as I am aware, there are absolutely no dentists that take medical insurance.  If anyone else has any insight into this, please let me know.

## 2015-12-06 ENCOUNTER — Telehealth: Payer: Self-pay

## 2015-12-06 ENCOUNTER — Other Ambulatory Visit: Payer: Self-pay | Admitting: Family Medicine

## 2015-12-06 NOTE — Telephone Encounter (Signed)
Pt was seen on 1/29 by Dr. Conley Rolls for HSV-1 (herpes simplex virus 1) infection - Primary. He  is still experiencing a lot of pain when he eats and when something touches the  sore spot in his mouth.  He would like refills on his magic mouthwash w/lidocaine SOLN [[id number]], oxyCODONE-acetaminophen (ROXICET) 5-325 MG tablet [[id number]] and his valACYclovir (VALTREX) 1000 MG tablet [[id number]]. Pharmacy:  [id number] - [place], [place] - [address] AT [address] OF [facility] [address] CB # [phone]

## 2015-12-07 NOTE — Telephone Encounter (Signed)
Dr.Le, please advise.

## 2015-12-08 NOTE — Telephone Encounter (Signed)
Patient was advised to call dental insurance find out what is covered.  He didn't want to have to pay for device for sleep apnea.  Patient understood

## 2016-01-02 ENCOUNTER — Ambulatory Visit (INDEPENDENT_AMBULATORY_CARE_PROVIDER_SITE_OTHER): Payer: Managed Care, Other (non HMO) | Admitting: Family Medicine

## 2016-01-02 VITALS — BP 114/69 | HR 63 | Temp 97.8°F | Resp 20 | Wt 235.6 lb

## 2016-01-02 DIAGNOSIS — G8929 Other chronic pain: Secondary | ICD-10-CM | POA: Diagnosis not present

## 2016-01-02 DIAGNOSIS — E785 Hyperlipidemia, unspecified: Secondary | ICD-10-CM

## 2016-01-02 DIAGNOSIS — I10 Essential (primary) hypertension: Secondary | ICD-10-CM

## 2016-01-02 DIAGNOSIS — M549 Dorsalgia, unspecified: Secondary | ICD-10-CM | POA: Diagnosis not present

## 2016-01-02 DIAGNOSIS — R21 Rash and other nonspecific skin eruption: Secondary | ICD-10-CM

## 2016-01-02 DIAGNOSIS — J329 Chronic sinusitis, unspecified: Secondary | ICD-10-CM | POA: Diagnosis not present

## 2016-01-02 DIAGNOSIS — E11620 Type 2 diabetes mellitus with diabetic dermatitis: Secondary | ICD-10-CM

## 2016-01-02 DIAGNOSIS — J309 Allergic rhinitis, unspecified: Secondary | ICD-10-CM | POA: Diagnosis not present

## 2016-01-02 LAB — POCT SKIN KOH: Skin KOH, POC: NEGATIVE

## 2016-01-02 MED ORDER — AMOXICILLIN 875 MG PO TABS: 875.0000 mg | ORAL_TABLET | Freq: Two times a day (BID) | ORAL | Status: DC

## 2016-01-02 MED ORDER — AZELASTINE-FLUTICASONE 137-50 MCG/ACT NA SUSP: 1.0000 | Freq: Two times a day (BID) | NASAL | Status: DC

## 2016-01-02 MED ORDER — PREDNISONE 20 MG PO TABS: ORAL_TABLET | ORAL | Status: DC

## 2016-01-02 MED ORDER — HYDROCODONE-ACETAMINOPHEN 10-325 MG PO TABS: 1.0000 | ORAL_TABLET | ORAL | Status: DC | PRN

## 2016-01-02 MED ORDER — CLOTRIMAZOLE-BETAMETHASONE 1-0.05 % EX CREA: 1.0000 "application " | TOPICAL_CREAM | Freq: Two times a day (BID) | CUTANEOUS | Status: DC

## 2016-01-02 MED ORDER — METHYLPREDNISOLONE SODIUM SUCC 125 MG IJ SOLR
80.0000 mg | Freq: Once | INTRAMUSCULAR | Status: AC
  Administered 2016-01-02: 80 mg via INTRAMUSCULAR

## 2016-01-02 MED ORDER — LIDOCAINE VISCOUS 2 % MT SOLN: 20.0000 mL | OROMUCOSAL | Status: DC | PRN

## 2016-01-02 NOTE — Progress Notes (Signed)
Subjective:  By signing my name below, I, Troy Obrien, attest that this documentation has been prepared under the direction and in the presence of Troy Sorenson, MD.  Electronically Signed: Andrew Obrien, ED Scribe. 01/02/2016. 4:53 PM.   Patient ID: Troy Obrien, male    DOB: Jun 27, 1951, 65 y.o.   MRN: [id number]  HPI Chief Complaint  Patient presents with  . Sinusitis    Runny nose  . Follow-up    for blood pressure     HPI Comments: Troy Obrien is a 65 y.o. male who presents to the [facility] complaining of sinusitis. We added amlodiphine 5 mg at last visit with me. Also he was going to work on diet due to increase in a1c. He is due for cholesterol panel. He has an appointment schedule with me at the end of march for this.   Pt has had 1-2 weeks of worsening sinus congestion and sinus pressure. He states he has been unable to breath out of his nose resulting in dry mouth. Pt has taken Claritin without relief.  He was taking Dymista  but ran out of this medication. Pt denies fever and chills.   Pt has an ithcy area to dorsum of left foot. He states area itches more times than others. He has been applying peroxide to area. He's also used eucerin which helped somewhat.     Pt also complains of a sore area on left tongue. He reports soreness when eating spicy or hot foods.    His glaucoma has been doing okay.   Past Medical History  Diagnosis Date  . Headache(784.0)   . Hypertension   . Hyperlipidemia   . Arthritis   . Inguinal hernia   . Gout   . Glaucoma   . Rickets, fetal   . Vitamin D deficiency    Past Surgical History  Procedure Laterality Date  . Circumcision  1972  . Tonsillectomy  1963  . Carpel tunnel      bilateral  . Inguinal hernia repair  10/11/2012    Procedure: HERNIA REPAIR INGUINAL ADULT;  Surgeon: Robyne Askew, MD;  Location: [facility] OR;  Service: General;  Laterality: Right;  . Insertion of mesh  10/11/2012    Procedure: INSERTION OF  MESH;  Surgeon: Robyne Askew, MD;  Location: [facility] OR;  Service: General;  Laterality: Right;   Prior to Admission medications   Medication Sig Start Date End Date Taking? Authorizing Provider  amLODipine (NORVASC) 5 MG tablet Take 1 tablet (5 mg total) by mouth daily. 10/22/15  Yes Sherren Mocha, MD  aspirin 81 MG tablet Take 81 mg by mouth daily.   Yes Historical Provider, MD  bimatoprost (LUMIGAN) 0.01 % SOLN Place 1 drop into both eyes at bedtime.   Yes Historical Provider, MD  doxazosin (CARDURA) 4 MG tablet Take 1.5 tablets (6 mg total) by mouth at bedtime. 08/03/15  Yes Sherren Mocha, MD  HYDROcodone-acetaminophen (NORCO) 10-325 MG tablet Take 1 tablet by mouth every 4 (four) hours as needed for moderate pain. 10/22/15  Yes Sherren Mocha, MD  HYDROcodone-acetaminophen (NORCO) 10-325 MG tablet Take 1 tablet by mouth every 4 (four) hours as needed for severe pain. 10/22/15  Yes Sherren Mocha, MD  HYDROcodone-acetaminophen (NORCO) 10-325 MG tablet Take 1 tablet by mouth every 4 (four) hours as needed for moderate pain. 10/22/15  Yes Sherren Mocha, MD  meloxicam (MOBIC) 15 MG tablet TAKE 1  TABLET BY MOUTH EVERY DAY FOR PAIN AND INFLAMMATION 10/27/15  Yes Sherren MochaEva N Shaw, MD  pravastatin (PRAVACHOL) 20 MG tablet Take 1 tablet (20 mg total) by mouth at bedtime. 10/27/15  Yes Sherren MochaEva N Shaw, MD  Azelastine-Fluticasone [id number] MCG/ACT SUSP Place 1 spray into the nose 2 (two) times daily. Patient not taking: Reported on 11/29/2015 05/09/15   Sherren MochaEva N Shaw, MD  magic mouthwash w/lidocaine SOLN Take 5 mLs by mouth 4 (four) times daily as needed for mouth pain. Patient not taking: Reported on 01/02/2016 11/29/15   Thao P Le, DO  oxyCODONE-acetaminophen (ROXICET) 5-325 MG tablet Take 1 tablet by mouth every 8 (eight) hours as needed for severe pain. Patient not taking: Reported on 01/02/2016 11/29/15   Thao P Le, DO  sildenafil (VIAGRA) 100 MG tablet Take 1/4 to 1/2 tablet po qd as needed for erectile dysfunction  30 min to 4 hr prior to  intercourse Patient not taking: Reported on 01/02/2016 09/07/15   Sherren MochaEva N Shaw, MD  valACYclovir (VALTREX) 1000 MG tablet Take 2 tabs po 12 hours apart x 2 doses, then 1 tab po twice a day x 5 days Patient not taking: Reported on 01/02/2016 11/29/15   Thao P Le, DO   Review of Systems  Constitutional: Positive for diaphoresis and fatigue. Negative for fever, chills, activity change, appetite change and unexpected weight change.  HENT: Positive for congestion, mouth sores, postnasal drip, rhinorrhea and sinus pressure. Negative for dental problem, facial swelling, nosebleeds, sore throat and trouble swallowing.   Eyes: Negative for visual disturbance.  Respiratory: Negative for cough.   Musculoskeletal: Positive for myalgias, back pain and arthralgias. Negative for joint swelling and gait problem.  Skin: Positive for color change and rash.  Neurological: Negative for weakness and numbness.  Psychiatric/Behavioral: Positive for sleep disturbance.       Objective:   Physical Exam  Constitutional: He is oriented to person, place, and time. He appears well-developed and well-nourished. No distress.  HENT:  Head: Normocephalic and atraumatic.  Right Ear: Tympanic membrane is injected and retracted.  Left Ear: Tympanic membrane is injected and retracted.  Nose: Rhinorrhea ( clear) present.  Nares pale, boggy and completely occluded.  oropharynx with post nasal drip.  Shallow based shaggy ulceration on left posterior tongue.   Eyes: Conjunctivae and EOM are normal.  Neck: Neck supple.  Cardiovascular: Normal rate.   Pulmonary/Chest: Effort normal.  Musculoskeletal: Normal range of motion.  Neurological: He is alert and oriented to person, place, and time.  Skin: Skin is warm and dry.  Psychiatric: He has a normal mood and affect. His behavior is normal.  Nursing note and vitals reviewed.   Filed Vitals:   01/02/16 1649  BP: 114/69  Pulse: 63  Temp: 97.8 F (36.6 C)  TempSrc: Oral  Resp:  20  Weight: 235 lb 9.6 oz (106.867 kg)  SpO2: 96%   Results for orders placed or performed in visit on 01/02/16  POCT Skin KOH  Result Value Ref Range   Skin KOH, POC Negative     Assessment & Plan:   1. Rash and nonspecific skin eruption - on left foot - looks like ring worm and is pruritic  2. Chronic sinusitis, recurrence not specified, unspecified location - cont dymista - pt has been on for a while and glaucoma is stable. Has seen ENT and sinus surg last year w/o improvement.  Solumedrol 95 mg IM x 1 now and start pred taper tomorrow with amox  3.  Chronic back pain - refilled chronic norco.  4. Allergic rhinitis, unspecified allergic rhinitis type     Pt reminded of f/u appt for DM, HPL, HTN in 4 wks - come prior to appt for fasting lab only visit.   Orders Placed This Encounter  Procedures  . POCT Skin KOH    Meds ordered this encounter  Medications  . Azelastine-Fluticasone 137-50 MCG/ACT SUSP    Sig: Place 1 spray into the nose 2 (two) times daily.    Dispense:  1 Bottle    Refill:  11    Has failed qnasl, flonase, singulair, claritin, allegra, xyzal, zyrtec, and others  . HYDROcodone-acetaminophen (NORCO) 10-325 MG tablet    Sig: Take 1 tablet by mouth every 4 (four) hours as needed for moderate pain.    Dispense:  150 tablet    Refill:  0    May fill on 01/18/2016  . predniSONE (DELTASONE) 20 MG tablet    Sig: 3 tabs po qd x 3d, 2 tabs po qd x 3d, 1 tab po qd x 3d    Dispense:  18 tablet    Refill:  0  . amoxicillin (AMOXIL) 875 MG tablet    Sig: Take 1 tablet (875 mg total) by mouth 2 (two) times daily.    Dispense:  20 tablet    Refill:  0  . clotrimazole-betamethasone (LOTRISONE) cream    Sig: Apply 1 application topically 2 (two) times daily.    Dispense:  45 g    Refill:  1  . lidocaine (XYLOCAINE) 2 % solution    Sig: Use as directed 20 mLs in the mouth or throat as needed for mouth pain.    Dispense:  100 mL    Refill:  1    I personally performed  the services described in this documentation, which was scribed in my presence. The recorded information has been reviewed and considered, and addended by me as needed.  Troy Sorenson, MD MPH

## 2016-01-02 NOTE — Patient Instructions (Signed)
Come back on an early morning the day of or prior to your next appointment so we can recheck your diabetes and your cholesterol when you are fasting.

## 2016-01-07 ENCOUNTER — Telehealth: Payer: Self-pay

## 2016-01-07 NOTE — Telephone Encounter (Signed)
PA approved through 01/06/17. Notified pharm. 

## 2016-01-07 NOTE — Telephone Encounter (Signed)
PA completed for Dymista NS on covermymeds. Pending. Pt Has failed qnasl, flonase, singulair, claritin, allegra, xyzal, zyrtec, and otherspreviously and has been using Dymista with good effectiveness.

## 2016-01-14 ENCOUNTER — Ambulatory Visit: Payer: BLUE CROSS/BLUE SHIELD | Admitting: Family Medicine

## 2016-01-18 ENCOUNTER — Telehealth: Payer: Self-pay

## 2016-01-18 NOTE — Telephone Encounter (Signed)
[facility] pt's NORCO 10-325 MG three days earlier. Please call [phone]    [facility] [address] [facility] [phone]

## 2016-01-19 NOTE — Telephone Encounter (Signed)
[facility] on [facility]ic[facility].

## 2016-01-19 NOTE — Telephone Encounter (Signed)
Yes, ok to fill today.

## 2016-01-23 ENCOUNTER — Other Ambulatory Visit (INDEPENDENT_AMBULATORY_CARE_PROVIDER_SITE_OTHER): Payer: Managed Care, Other (non HMO) | Admitting: *Deleted

## 2016-01-23 DIAGNOSIS — R7989 Other specified abnormal findings of blood chemistry: Secondary | ICD-10-CM

## 2016-01-23 DIAGNOSIS — I10 Essential (primary) hypertension: Secondary | ICD-10-CM

## 2016-01-23 DIAGNOSIS — E559 Vitamin D deficiency, unspecified: Secondary | ICD-10-CM

## 2016-01-23 DIAGNOSIS — E11620 Type 2 diabetes mellitus with diabetic dermatitis: Secondary | ICD-10-CM | POA: Diagnosis not present

## 2016-01-23 DIAGNOSIS — E785 Hyperlipidemia, unspecified: Secondary | ICD-10-CM

## 2016-01-23 LAB — COMPREHENSIVE METABOLIC PANEL
ALT: 13 U/L (ref 9–46)
AST: 16 U/L (ref 10–35)
Albumin: 4 g/dL (ref 3.6–5.1)
Alkaline Phosphatase: 35 U/L — ABNORMAL LOW (ref 40–115)
BILIRUBIN TOTAL: 0.4 mg/dL (ref 0.2–1.2)
BUN: 25 mg/dL (ref 7–25)
CHLORIDE: 106 mmol/L (ref 98–110)
CO2: 26 mmol/L (ref 20–31)
CREATININE: 1.39 mg/dL — AB (ref 0.70–1.25)
Calcium: 9 mg/dL (ref 8.6–10.3)
Glucose, Bld: 95 mg/dL (ref 65–99)
Potassium: 4.5 mmol/L (ref 3.5–5.3)
SODIUM: 140 mmol/L (ref 135–146)
TOTAL PROTEIN: 7.1 g/dL (ref 6.1–8.1)

## 2016-01-23 LAB — LIPID PANEL
Cholesterol: 170 mg/dL (ref 125–200)
HDL: 46 mg/dL (ref >=40)
LDL CALC: 99 mg/dL (ref <130)
Total CHOL/HDL Ratio: 3.7 Ratio (ref <=5.0)
Triglycerides: 126 mg/dL (ref <150)
VLDL: 25 mg/dL (ref <30)

## 2016-01-23 LAB — TESTOSTERONE: Testosterone: 139 ng/dL — ABNORMAL LOW (ref 250–827)

## 2016-01-23 NOTE — Progress Notes (Signed)
Lab only visit 

## 2016-01-24 ENCOUNTER — Other Ambulatory Visit: Payer: Self-pay | Admitting: Family Medicine

## 2016-01-24 LAB — HEMOGLOBIN A1C
Hgb A1c MFr Bld: 6.2 % — ABNORMAL HIGH (ref <5.7)
Mean Plasma Glucose: 131 mg/dL — ABNORMAL HIGH (ref <117)

## 2016-01-25 ENCOUNTER — Encounter: Payer: Self-pay | Admitting: Family Medicine

## 2016-01-25 ENCOUNTER — Other Ambulatory Visit: Payer: Self-pay | Admitting: Family Medicine

## 2016-01-25 DIAGNOSIS — N401 Enlarged prostate with lower urinary tract symptoms: Secondary | ICD-10-CM

## 2016-01-25 DIAGNOSIS — R351 Nocturia: Secondary | ICD-10-CM

## 2016-01-25 DIAGNOSIS — R7989 Other specified abnormal findings of blood chemistry: Secondary | ICD-10-CM

## 2016-01-25 DIAGNOSIS — N529 Male erectile dysfunction, unspecified: Secondary | ICD-10-CM

## 2016-01-25 LAB — VITAMIN D 25 HYDROXY (VIT D DEFICIENCY, FRACTURES): VIT D 25 HYDROXY: 32 ng/mL (ref 30–100)

## 2016-01-26 LAB — MICROALBUMIN / CREATININE URINE RATIO
CREATININE, URINE: 116 mg/dL (ref 20–370)
Microalb Creat Ratio: 2 mcg/mg creat (ref <30)
Microalb, Ur: 0.2 mg/dL

## 2016-01-28 ENCOUNTER — Encounter: Payer: Self-pay | Admitting: Family Medicine

## 2016-01-28 ENCOUNTER — Ambulatory Visit (INDEPENDENT_AMBULATORY_CARE_PROVIDER_SITE_OTHER): Payer: Managed Care, Other (non HMO) | Admitting: Family Medicine

## 2016-01-28 ENCOUNTER — Other Ambulatory Visit: Payer: Self-pay | Admitting: Family Medicine

## 2016-01-28 VITALS — BP 137/77 | HR 54 | Temp 97.9°F | Resp 18 | Ht 67.0 in | Wt 240.6 lb

## 2016-01-28 DIAGNOSIS — G894 Chronic pain syndrome: Secondary | ICD-10-CM

## 2016-01-28 DIAGNOSIS — R5383 Other fatigue: Secondary | ICD-10-CM | POA: Diagnosis not present

## 2016-01-28 DIAGNOSIS — J3489 Other specified disorders of nose and nasal sinuses: Secondary | ICD-10-CM

## 2016-01-28 DIAGNOSIS — J329 Chronic sinusitis, unspecified: Secondary | ICD-10-CM | POA: Diagnosis not present

## 2016-01-28 DIAGNOSIS — Z79899 Other long term (current) drug therapy: Secondary | ICD-10-CM

## 2016-01-28 DIAGNOSIS — K120 Recurrent oral aphthae: Secondary | ICD-10-CM

## 2016-01-28 MED ORDER — HYDROCODONE-ACETAMINOPHEN 10-325 MG PO TABS: 1.0000 | ORAL_TABLET | ORAL | Status: DC | PRN

## 2016-01-28 NOTE — Progress Notes (Signed)
Subjective:    Patient ID: Troy Obrien, male    DOB: May 01, 1951, 65 y.o.   MRN: [id number] Chief Complaint  Patient presents with  . Follow-up    lab work done earlier this week  . Nasal Congestion  . Back Pain    Back Pain Pertinent negatives include no fever.     She doesn't want to   Low carb doesn't d  Has been on prednisone taper and labs drawn   Still having pain is his mouth -  Burning with any foot at all.   Ran out of dymista  Saw Dr. Jearld Fenton again who thought there was nothing further that could be done and they did allergy testing.  Past Medical History  Diagnosis Date  . Headache(784.0)   . Hypertension   . Hyperlipidemia   . Arthritis   . Inguinal hernia   . Gout   . Glaucoma   . Rickets, fetal   . Vitamin D deficiency    Past Surgical History  Procedure Laterality Date  . Circumcision  1972  . Tonsillectomy  1963  . Carpel tunnel      bilateral  . Inguinal hernia repair  10/11/2012    Procedure: HERNIA REPAIR INGUINAL ADULT;  Surgeon: Robyne Askew, MD;  Location: [facility] OR;  Service: General;  Laterality: Right;  . Insertion of mesh  10/11/2012    Procedure: INSERTION OF MESH;  Surgeon: Robyne Askew, MD;  Location: [facility] OR;  Service: General;  Laterality: Right;   Current Outpatient Prescriptions on File Prior to Visit  Medication Sig Dispense Refill  . amLODipine (NORVASC) 5 MG tablet TAKE 1 TABLET BY MOUTH EVERY DAY 90 tablet 1  . aspirin 81 MG tablet Take 81 mg by mouth daily.    . bimatoprost (LUMIGAN) 0.01 % SOLN Place 1 drop into both eyes at bedtime.    . clotrimazole-betamethasone (LOTRISONE) cream Apply 1 application topically 2 (two) times daily. 45 g 1  . doxazosin (CARDURA) 4 MG tablet Take 1.5 tablets (6 mg total) by mouth at bedtime. 90 tablet 0  . HYDROcodone-acetaminophen (NORCO) 10-325 MG tablet Take 1 tablet by mouth every 4 (four) hours as needed for severe pain. 150 tablet 0  . HYDROcodone-acetaminophen (NORCO) 10-325 MG  tablet Take 1 tablet by mouth every 4 (four) hours as needed for moderate pain. 150 tablet 0  . HYDROcodone-acetaminophen (NORCO) 10-325 MG tablet Take 1 tablet by mouth every 4 (four) hours as needed for moderate pain. 150 tablet 0  . meloxicam (MOBIC) 15 MG tablet TAKE 1 TABLET BY MOUTH EVERY DAY FOR PAIN AND INFLAMMATION 30 tablet 2  . pravastatin (PRAVACHOL) 20 MG tablet Take 1 tablet (20 mg total) by mouth at bedtime. 90 tablet 1  . Azelastine-Fluticasone 137-50 MCG/ACT SUSP Place 1 spray into the nose 2 (two) times daily. (Patient not taking: Reported on 01/28/2016) 1 Bottle 11  . lidocaine (XYLOCAINE) 2 % solution Use as directed 20 mLs in the mouth or throat as needed for mouth pain. (Patient not taking: Reported on 01/28/2016) 100 mL 1  . predniSONE (DELTASONE) 20 MG tablet 3 tabs po qd x 3d, 2 tabs po qd x 3d, 1 tab po qd x 3d (Patient not taking: Reported on 01/28/2016) 18 tablet 0  . sildenafil (VIAGRA) 100 MG tablet Take 1/4 to 1/2 tablet po qd as needed for erectile dysfunction  30 min to 4 hr prior to intercourse (Patient not taking: Reported on 01/02/2016) 5  tablet 11   No current facility-administered medications on file prior to visit.   Allergies  Allergen Reactions  . Laxative [Bisacodyl] Hives  . Cyclobenzaprine Other (See Comments)    Hand twitching/spasms   No family history on file. Social History   Social History  . Marital Status: Married    Spouse Name: N/A  . Number of Children: N/A  . Years of Education: N/A   Social History Main Topics  . Smoking status: Never Smoker   . Smokeless tobacco: Never Used  . Alcohol Use: 0.0 oz/week    0 Standard drinks or equivalent per week     Comment: rare  . Drug Use: No  . Sexual Activity: Yes    Birth Control/ Protection: None   Other Topics Concern  . None   Social History Narrative   [facility] 01/02/2016 11/29/2015 10/22/2015 08/03/2015 05/09/2015  Decreased Interest 0 0 0 0 0  Down, Depressed,  Hopeless 0 0 0 0 0  PHQ - 2 Score 0 0 0 0 0      Review of Systems  Constitutional: Positive for fatigue. Negative for fever, chills, diaphoresis, activity change, appetite change and unexpected weight change.  HENT: Positive for congestion, dental problem, mouth sores, postnasal drip, rhinorrhea and sinus pressure. Negative for facial swelling, nosebleeds, sore throat, trouble swallowing and voice change.   Eyes: Positive for visual disturbance.  Respiratory: Positive for apnea.   Musculoskeletal: Positive for myalgias, back pain and arthralgias. Negative for joint swelling and gait problem.  Skin: Positive for rash.  Hematological: Negative for adenopathy.  Psychiatric/Behavioral: Negative for sleep disturbance and dysphoric mood. The patient is not nervous/anxious.        Objective:  BP 137/77 mmHg  Pulse 54  Temp(Src) 97.9 F (36.6 C) (Oral)  Resp 18  Ht 5\' 7"  (1.702 m)  Wt 240 lb 9.6 oz (109.135 kg)  BMI 37.67 kg/m2  Physical Exam  Constitutional: He is oriented to person, place, and time. He appears well-developed and well-nourished. No distress.  HENT:  Head: Normocephalic and atraumatic.  Eyes: Conjunctivae are normal. Pupils are equal, round, and reactive to light. No scleral icterus.  Neck: Normal range of motion. Neck supple. No thyromegaly present.  Cardiovascular: Normal rate, regular rhythm, normal heart sounds and intact distal pulses.   Pulmonary/Chest: Effort normal and breath sounds normal. No respiratory distress.  Musculoskeletal: He exhibits no edema.  Lymphadenopathy:    He has no cervical adenopathy.  Neurological: He is alert and oriented to person, place, and time.  Skin: Skin is warm and dry. He is not diaphoretic.  Psychiatric: His speech is normal and behavior is normal. He exhibits a depressed mood.          Assessment & Plan:   Nystatin 1. Refractory obstruction of nasal airway   2. Other fatigue   3. Aphthous stomatitis - persists for  > 2 mos - no improvement w/ prior valtrex, magic wash and lidocaine.   4. Chronic sinusitis, unspecified location   5. Chronic pain syndrome   6. High risk medication use    7.  Low testosterone - sig drop in past yr but suspect it is a temporary low due to recent prior prednisone use.  Pt agrees that potential risks of testosterone supp seem to outweigh he potential benefits at this time as well as testosterone gel or IM are both inconvenient administration. Today I have utilized the [place] Controlled Substance Registry's online query to confirm compliance  regarding the patient's narcotic pain medications. My review reveals appropriate prescription fills and that [facility] is the sole provider of these medications. Rechecks will occur regularly and the patient is aware of our use of the system.   Orders Placed This Encounter  Procedures  . Ambulatory referral to Sleep Studies    Referral Priority:  Routine    Referral Type:  Consultation    Referral Reason:  Specialty Services Required    Number of Visits Requested:  1    Meds ordered this encounter  Medications  . DISCONTD: HYDROcodone-acetaminophen (NORCO) 10-325 MG tablet    Sig: Take 1 tablet by mouth every 4 (four) hours as needed for moderate pain.    Dispense:  150 tablet    Refill:  0    May fill on 01/18/2016  . DISCONTD: HYDROcodone-acetaminophen (NORCO) 10-325 MG tablet    Sig: Take 1 tablet by mouth every 4 (four) hours as needed for moderate pain.    Dispense:  150 tablet    Refill:  0    May fill 12/21/2015. This is a 30d supply  . DISCONTD: HYDROcodone-acetaminophen (NORCO) 10-325 MG tablet    Sig: Take 1 tablet by mouth every 4 (four) hours as needed for severe pain.    Dispense:  150 tablet    Refill:  0    This is a 30d supply. May fill on or after 11/21/2015  . HYDROcodone-acetaminophen (NORCO) 10-325 MG tablet    Sig: Take 1 tablet by mouth every 4 (four) hours as needed for moderate pain.     Dispense:  150 tablet    Refill:  0    May fill 03/15/2016. This is a 30d supply  . HYDROcodone-acetaminophen (NORCO) 10-325 MG tablet    Sig: Take 1 tablet by mouth every 4 (four) hours as needed for severe pain.    Dispense:  150 tablet    Refill:  0    This is a 30d supply. May fill on or after 04/13/2016  . HYDROcodone-acetaminophen (NORCO) 10-325 MG tablet    Sig: Take 1 tablet by mouth every 4 (four) hours as needed for moderate pain.    Dispense:  150 tablet    Refill:  0    May fill on 02/16/2016    [name], MD MPH

## 2016-01-28 NOTE — Patient Instructions (Signed)
The low testosterone is caused by the prednisone and should bounce back - does not need to be treated, cancel the urology referral. Will refer you to pulmonology or neurology for a sleep study Canker Sores Canker sores are small, painful sores that develop inside your mouth. They may also be called aphthous ulcers. You can get canker sores on the inside of your lips or cheeks, on your tongue, or anywhere inside your mouth. You can have just one canker sore or several of them. Canker sores cannot be passed from one person to another (noncontagious). These sores are different than the sores that you may get on the outside of your lips (cold sores or fever blisters). Canker sores usually start as painful red bumps. Then they turn into small white, yellow, or gray ulcers that have red borders. The ulcers may be quite painful. The pain may be worse when you eat or drink. CAUSES The cause of this condition is not known. RISK FACTORS This condition is more likely to develop in:  Women.  People in their teens or 5320s.  Women who are having their menstrual period.  People who are under a lot of emotional stress.  People who do not get enough iron or B vitamins.  People who have poor oral hygiene.  People who have an injury inside the mouth. This can happen after having dental work or from chewing something hard. SYMPTOMS Along with the canker sore, symptoms may also include:  Fever.  Fatigue.  Swollen lymph nodes in your neck. DIAGNOSIS This condition can be diagnosed based on your symptoms. Your health care provider will also examine your mouth. Your health care provider may also do tests if you get canker sores often or if they are very bad. Tests may include:  Blood tests to rule out other causes of canker sores.  Taking swabs from the sore to check for infection.  Taking a small piece of skin from the sore (biopsy) to test it for cancer. TREATMENT Most canker sores clear up without  treatment in about 10 days. Home care is usually the only treatment that you will need. Over-the-counter medicines can relieve discomfort.If you have severe canker sores, your health care provider may prescribe:  Numbing ointment to relieve pain.  Vitamins.  Steroid medicines. These may be given as:  Oral pills.  Mouth rinses.  Gels.  Antibiotic mouth rinse. HOME CARE INSTRUCTIONS  Apply, take, or use medicines only as directed by your health care provider. These include vitamins.  If you were prescribed an antibiotic mouth rinse, finish all of it even if you start to feel better.  Until the sores are healed:  Do not drink coffee or citrus juices.  Do not eat spicy or salty foods.  Use a mild, over-the-counter mouth rinse as directed by your health care provider.  Practice good oral hygiene.  Floss your teeth every day.  Brush your teeth with a soft brush twice each day. SEEK MEDICAL CARE IF:  Your symptoms do not get better after two weeks.  You also have a fever or swollen glands.  You get canker sores often.  You have a canker sore that is getting larger.  You cannot eat or drink due to your canker sores.   This information is not intended to replace advice given to you by your health care provider. Make sure you discuss any questions you have with your health care provider.   Document Released: 02/11/2011 Document Revised: 03/03/2015 Document Reviewed: 09/17/2014  Chartered certified accountant Patient Education [employer].

## 2016-02-02 MED ORDER — NYSTATIN 100000 UNIT/GM EX CREA: 1.0000 "application " | TOPICAL_CREAM | Freq: Two times a day (BID) | CUTANEOUS | Status: DC

## 2016-02-03 ENCOUNTER — Telehealth: Payer: Self-pay

## 2016-02-03 NOTE — Telephone Encounter (Signed)
Pt states that we were supposed to call in a medication for the pain in his mouth but it is not at the pharmacy  Best number [phone]

## 2016-02-04 MED ORDER — NYSTATIN 100000 UNIT/ML MT SUSP: 5.0000 mL | Freq: Four times a day (QID) | OROMUCOSAL | Status: DC

## 2016-02-04 NOTE — Telephone Encounter (Signed)
Looks like I had accidentally sent the nystatin in a cream rather in a mouthwash.  Sent in mouthwash, please tell pt not to pick up cream.

## 2016-02-04 NOTE — Telephone Encounter (Signed)
Spoke with pt, he states he is missing a mouth rinse Dr. Clelia CroftShaw was supposed to give him.

## 2016-02-05 NOTE — Telephone Encounter (Signed)
Advised pt, we took off the nystatin cream at the pharmacy.

## 2016-02-14 ENCOUNTER — Other Ambulatory Visit: Payer: Self-pay | Admitting: Family Medicine

## 2016-02-17 ENCOUNTER — Ambulatory Visit (INDEPENDENT_AMBULATORY_CARE_PROVIDER_SITE_OTHER): Payer: Managed Care, Other (non HMO) | Admitting: Neurology

## 2016-02-17 ENCOUNTER — Encounter: Payer: Self-pay | Admitting: Neurology

## 2016-02-17 VITALS — BP 140/80 | HR 66 | Resp 20 | Ht 67.5 in | Wt 242.0 lb

## 2016-02-17 DIAGNOSIS — G4730 Sleep apnea, unspecified: Secondary | ICD-10-CM | POA: Diagnosis not present

## 2016-02-17 DIAGNOSIS — G4710 Hypersomnia, unspecified: Secondary | ICD-10-CM | POA: Diagnosis not present

## 2016-02-17 DIAGNOSIS — R51 Headache: Secondary | ICD-10-CM | POA: Diagnosis not present

## 2016-02-17 DIAGNOSIS — R0683 Snoring: Secondary | ICD-10-CM

## 2016-02-17 DIAGNOSIS — R519 Headache, unspecified: Secondary | ICD-10-CM | POA: Insufficient documentation

## 2016-02-17 DIAGNOSIS — J3489 Other specified disorders of nose and nasal sinuses: Secondary | ICD-10-CM

## 2016-02-17 DIAGNOSIS — R351 Nocturia: Secondary | ICD-10-CM

## 2016-02-17 HISTORY — DX: Headache, unspecified: R51.9

## 2016-02-17 HISTORY — DX: Morbid (severe) obesity due to excess calories: E66.01

## 2016-02-17 HISTORY — DX: Sleep apnea, unspecified: G47.30

## 2016-02-17 HISTORY — DX: Snoring: R06.83

## 2016-02-17 HISTORY — DX: Hypersomnia, unspecified: G47.10

## 2016-02-17 HISTORY — DX: Other specified disorders of nose and nasal sinuses: J34.89

## 2016-02-17 HISTORY — DX: Nocturia: R35.1

## 2016-02-17 MED ORDER — OXYMETAZOLINE HCL 0.05 % NA SOLN: 1.0000 | Freq: Two times a day (BID) | NASAL | Status: DC

## 2016-02-17 NOTE — Progress Notes (Signed)
SLEEP MEDICINE CLINIC   Provider:  Melvyn Novas, M D  Referring Provider: Sherren Mocha, MD Primary Care Physician:  [name], MD  Chief Complaint  Patient presents with  . New Patient (Initial Visit)    pt can't breathe at night, never had a sleep study, rm 11, alone    HPI:  Troy Obrien is a 65 y.o. male , seen here as a referral  from Dr. [name] for a sleep consultation,   Chief complaint according to patient : " feeling short of breath and waking up with trouble breathing" .  Troy Obrien is a right-handed African-American gentleman who presented today as a new patient to [facility] for a sleep evaluation. He was seen on 02/02/2016 by Dr. Clelia Croft presenting with pain in his mouth, back pain and nasal congestion. The patient has a past diagnosis of frequent headaches, obesity, hypertension, hyperlipidemia, osteoarthritis, gout, glaucoma vitamin C deficiency and vitamin D deficiency. The patient underwent a  Circumcision, tonsillectomy had bilateral carpal tunnel repairs and a hernia repair. His medication list was confirmed with him he also uses an over-the-counter supplement ; "horney goat weed".  Troy Obrien described that he is not depressed but fatigued, without any variation throughout the day. He is also excessively daytime sleepy and endorsed the Epworth Sleepiness Scale today at 24 points fatigue severity at 50 points. The pH Q2 score was 0.  Sleep habits are as follows:  Troy Obrien states that it is often difficult for him to fall asleep before midnight, but usually he is in bed by midnight sleeping until 5 AM. He has up to 5 nocturia breaks fragmenting his short sleep time even further. He relies on an alarm in the morning he does not wake up spontaneously.  On his commute to work he feels already very drowsy. Some mornings he will wake up with a headache, but the headache does normally not wake him. He denies feeling anxious or worried. He wakes up with a  very dry almost parched mouth and he can hardly breathe through his nose. He has always been a mouth breather due to chronic nasal congestion. His wife has told him that he snores and she has reported apneas. [name] bedroom is cool, quiet and dark and he shares that with his wife. He usually sleeps on the site using only one pillow. He reports that he does not feel short of breath at night. He sometimes wakes up diaphoretic but attributes this to the surrounding temperature,. He does have some back pain but this does usually not wake him.  Dr. Clelia Croft has already referred the patient to an ENT surgeon who performed a turbinate reduction to open the nasal passageway but this has not worked. He presents today with a refractory obstruction of his nasal airway. Also has had a stool stomatitis causing the mouth burning. He has chronic sinusitis. He is chronically sleep deprived and he is considered at this time morbidly obese with a BMI of 38. Dr. Clelia Croft also mentioned that the patient has used narcotic pain medications and that these will also affect his respiratory rhythm and the depth of his breathing at night.  Sleep medical history and family sleep history:    Social history: full time gainfully employed in [place], [place] at [employer], Data processing manager for [employer]..  married, no tobacco use, ETOH social drinker, 2-3 a week. Caffeine : coffee 1-2 cup a day.   Review of Systems: Out of a complete  14 system review, the patient complains of only the following symptoms, and all other reviewed systems are negative.  How likely are you to doze in the following situations: 0 = not likely, 1 = slight chance, 2 = moderate chance, 3 = high chance  Sitting and Reading? Watching Television? Sitting inactive in a public place (theater or meeting)? Lying down in the afternoon when circumstances permit? Sitting and talking to someone? Sitting quietly after lunch without alcohol? In a car, while stopped for a  few minutes in traffic? As a passenger in a car for an hour without a break?  All endorsed at 3   Total =24   Social History   Social History  . Marital Status: Married    Spouse Name: N/A  . Number of Children: N/A  . Years of Education: N/A   Occupational History  . Not on file.   Social History Main Topics  . Smoking status: Never Smoker   . Smokeless tobacco: Never Used  . Alcohol Use: 0.0 oz/week    0 Standard drinks or equivalent per week     Comment: rare  . Drug Use: No  . Sexual Activity: Yes    Birth Control/ Protection: None   Other Topics Concern  . Not on file   Social History Narrative    History reviewed. No pertinent family history.  Past Medical History  Diagnosis Date  . Headache(784.0)   . Hypertension   . Hyperlipidemia   . Arthritis   . Inguinal hernia   . Gout   . Glaucoma   . Rickets, fetal   . Vitamin D deficiency     Past Surgical History  Procedure Laterality Date  . Circumcision  1972  . Tonsillectomy  1963  . Carpel tunnel      bilateral  . Inguinal hernia repair  10/11/2012    Procedure: HERNIA REPAIR INGUINAL ADULT;  Surgeon: Robyne Askew, MD;  Location: [facility] OR;  Service: General;  Laterality: Right;  . Insertion of mesh  10/11/2012    Procedure: INSERTION OF MESH;  Surgeon: Robyne Askew, MD;  Location: [facility] OR;  Service: General;  Laterality: Right;    Current Outpatient Prescriptions  Medication Sig Dispense Refill  . amLODipine (NORVASC) 5 MG tablet TAKE 1 TABLET BY MOUTH EVERY DAY 90 tablet 1  . aspirin 81 MG tablet Take 81 mg by mouth daily.    . Azelastine-Fluticasone 137-50 MCG/ACT SUSP Place 1 spray into the nose 2 (two) times daily. 1 Bottle 11  . bimatoprost (LUMIGAN) 0.01 % SOLN Place 1 drop into both eyes at bedtime.    . clotrimazole-betamethasone (LOTRISONE) cream Apply 1 application topically 2 (two) times daily. 45 g 1  . doxazosin (CARDURA) 4 MG tablet Take 1.5 tablets (6 mg total) by mouth at  bedtime. 90 tablet 0  . HYDROcodone-acetaminophen (NORCO) 10-325 MG tablet Take 1 tablet by mouth every 4 (four) hours as needed for moderate pain. 150 tablet 0  . lidocaine (XYLOCAINE) 2 % solution Use as directed 20 mLs in the mouth or throat as needed for mouth pain. 100 mL 1  . meloxicam (MOBIC) 15 MG tablet TAKE 1 TABLET BY MOUTH EVERY DAY FOR PAIN AND INFLAMMATION 30 tablet 2  . nystatin (MYCOSTATIN) 100000 UNIT/ML suspension Take 5 mLs (500,000 Units total) by mouth 4 (four) times daily. 280 mL 0  . pravastatin (PRAVACHOL) 20 MG tablet TAKE 1 TABLET (20 MG TOTAL) BY MOUTH AT BEDTIME. 90 tablet  0  . predniSONE (DELTASONE) 20 MG tablet 3 tabs po qd x 3d, 2 tabs po qd x 3d, 1 tab po qd x 3d 18 tablet 0  . sildenafil (VIAGRA) 100 MG tablet Take 1/4 to 1/2 tablet po qd as needed for erectile dysfunction  30 min to 4 hr prior to intercourse 5 tablet 11   No current facility-administered medications for this visit.    Allergies as of 02/17/2016 - Review Complete 02/17/2016  Allergen Reaction Noted  . Laxative [bisacodyl] Hives 03/02/2012  . Cyclobenzaprine Other (See Comments) 10/28/2013    Vitals: BP 140/80 mmHg  Pulse 66  Resp 20  Ht 5' 7.5" (1.715 m)  Wt 242 lb (109.77 kg)  BMI 37.32 kg/m2 Last Weight:  Wt Readings from Last 1 Encounters:  02/17/16 242 lb (109.77 kg)   WUJ:WJXBBMI:Body mass index is 37.32 kg/(m^2).     Last Height:   Ht Readings from Last 1 Encounters:  02/17/16 5' 7.5" (1.715 m)    Physical exam:  General: The patient is awake, alert and appears not in acute distress. The patient is well groomed. Head: Normocephalic, atraumatic. Neck is supple. Mallampati 4  neck circumference: 20. Nasal airflow obstruction . Retrognathia is seen.  Cardiovascular:  Regular rate and rhythm , without  murmurs or carotid bruit, and without distended neck veins. Respiratory:  Stridor, nasal passage obstructed.  Skin:  Without evidence of edema, or rash Trunk: BMI is elevated at 37.6  . The patient's posture is erect   Neurologic exam : The patient is awake and alert, oriented to place and time.   Memory subjective  described as impaired . Attention span & concentration ability appears normal.  Speech is fluent,  without dysarthria, dysphonia or aphasia.  Mood and affect are appropriate.  Cranial nerves: Pupils are equal and briskly reactive to light. Funduscopic exam without  evidence of pallor or edema.  cilliary injection noted.  Extraocular movements  in vertical and horizontal planes intact and without nystagmus. Visual fields by finger perimetry are intact. Hearing to finger rub intact.  Facial sensation intact to fine touch.Facial motor strength is symmetric and tongue and uvula move midline. Shoulder shrug was symmetrical.   Motor exam: Normal tone, muscle bulk and symmetric strength in all extremities.  Sensory:  Fine touch, pinprick and vibration/ Proprioception tested in the upper extremities was normal.  Coordination: Rapid alternating movements /Finger-to-nose maneuver  normal without evidence of ataxia, dysmetria or tremor.  Gait and station: Patient walks without assistive device and is able unassisted to climb up to the exam table. Strength within normal limits.  Stance is stable and normal.  Deep tendon reflexes: in the  upper and lower extremities are symmetric and intact. Babinski maneuver response is downgoing.  The patient was advised of the nature of the diagnosed sleep disorder , the treatment options and risks for general a health and wellness arising from not treating the condition.  I spent more than 45 minutes of face to face time with the patient. Greater than 50% of time was spent in counseling and coordination of care. We have discussed the diagnosis and differential and I answered the patient's questions.    Dear Virgel ManifoldAva, it was my pleasure to see Mr. Judi SaaHeaton today and am very concerned about his degree of sleepiness the very little resting  seems to get, but also some of his general health issues. He is excessively daytime sleepy. I would like to ask you to consider testing him for Behcet's  syndrome.   Assessment:  After physical and neurologic examination, review of laboratory studies,  Personal review of imaging studies, reports of other /same  Imaging studies ,  Results of polysomnography/ neurophysiology testing and pre-existing records as far as provided in visit., my assessment is   1) oral ulcers, cilliary injection and skin rash, arthropathies-  Dr Clelia Croft , please check for  Behcet's. Disease. 2) chronic obstructed nasal passage even after adenoid/ turbinate  surgery. Dr. Jearld Fenton again saw him and told him that there was nothing further he could be doing. He is still awaiting results of allergy testing. He continues to use a nasal saline spray.cannot see Ct on EPIC. Could he be helped with a nati pot?   3) Troy Obrien excessive excessively daytime sleepy, he wakes up unrefreshed unrestored often with a headache he has not been able to breathe through his nose and a very long time and current surgical and medical attempts to open the nasal passage for frustrating. With an Epworth sleepiness score of 24 is also in danger of being involved in an accident falling asleep while operating machinery etc. his fatigue score is almost equally high. Upon Dr. Alver Fisher request I will order a sleep study and we will check him for obstructive sleep apnea as his nasal passage does not allow air to flow he would probably not tolerate CPAP. Should apnea be diagnosed he may have to resort to an ENT surgical procedure. I would like to review his ENT records and especially imaging studies.    Plan:  Treatment plan and additional workup : SPLIT with CO2 monitoring. I will give him afrin for one night to achieve a minimal nasal patency.      Porfirio Mylar Hero Kulish MD  02/17/2016   CC: Sherren Mocha, Md [address] [place], [place] [address]

## 2016-02-17 NOTE — Patient Instructions (Signed)

## 2016-02-23 ENCOUNTER — Other Ambulatory Visit: Payer: Self-pay | Admitting: Family Medicine

## 2016-02-23 ENCOUNTER — Encounter: Payer: Self-pay | Admitting: *Deleted

## 2016-02-23 NOTE — Telephone Encounter (Signed)
Patient was seen 01/28/16  Can we refill Cardura?

## 2016-02-26 ENCOUNTER — Telehealth: Payer: Self-pay

## 2016-02-26 NOTE — Telephone Encounter (Signed)
PA completed for quanitity over ride for doxazosin 40. Pt takes total of 1.5 per day and ins will only cover #1 per day. The clinical ?s wouldn't load and I had to call for PA. Approved through 02/25/2019, case # [id number]. Notified pharm.

## 2016-03-21 ENCOUNTER — Telehealth: Payer: Self-pay | Admitting: Neurology

## 2016-03-21 NOTE — Telephone Encounter (Signed)
[employer] denied Sleep test.

## 2016-03-22 NOTE — Telephone Encounter (Signed)
What would they accept?

## 2016-04-08 ENCOUNTER — Telehealth: Payer: Self-pay

## 2016-04-08 NOTE — Telephone Encounter (Signed)
Pt called regarding medication clarification States, he is unsure of what medication he should/should not be taking after OV 4/13 States, [facility] called and told him to f/u with provider on medication dosage Pt did not know he was to take Doxazosin 1/2 tablet in the am and 1 tab @ bedtime, according to him, he never took medication as under this dose. Pt also did not know he was to take Pravastatin daily I instructed pt to schedule f/u appt for medication management/clarification Please f/u

## 2016-04-08 NOTE — Telephone Encounter (Signed)
Patient wants to know what medication he is supposed to be taking and which ones he is to be stopping.   [phone]

## 2016-04-10 ENCOUNTER — Other Ambulatory Visit: Payer: Self-pay | Admitting: Family Medicine

## 2016-04-12 ENCOUNTER — Telehealth: Payer: Self-pay | Admitting: Neurology

## 2016-04-12 DIAGNOSIS — R0683 Snoring: Secondary | ICD-10-CM

## 2016-04-12 NOTE — Telephone Encounter (Signed)
Dr. Vickey Hugerohmeier, I can try for a HST with [employer].  Do you want me to do that?  If so, please put in an order for HST.

## 2016-05-02 ENCOUNTER — Telehealth: Payer: Self-pay

## 2016-05-02 ENCOUNTER — Ambulatory Visit (INDEPENDENT_AMBULATORY_CARE_PROVIDER_SITE_OTHER): Payer: Managed Care, Other (non HMO) | Admitting: Family Medicine

## 2016-05-02 VITALS — BP 128/80 | HR 55 | Temp 97.8°F | Resp 16 | Ht 66.0 in | Wt 239.0 lb

## 2016-05-02 DIAGNOSIS — I10 Essential (primary) hypertension: Secondary | ICD-10-CM

## 2016-05-02 DIAGNOSIS — E785 Hyperlipidemia, unspecified: Secondary | ICD-10-CM

## 2016-05-02 DIAGNOSIS — R7303 Prediabetes: Secondary | ICD-10-CM | POA: Diagnosis not present

## 2016-05-02 DIAGNOSIS — J3489 Other specified disorders of nose and nasal sinuses: Secondary | ICD-10-CM

## 2016-05-02 DIAGNOSIS — N401 Enlarged prostate with lower urinary tract symptoms: Secondary | ICD-10-CM

## 2016-05-02 DIAGNOSIS — G4733 Obstructive sleep apnea (adult) (pediatric): Secondary | ICD-10-CM | POA: Diagnosis not present

## 2016-05-02 DIAGNOSIS — R5383 Other fatigue: Secondary | ICD-10-CM

## 2016-05-02 DIAGNOSIS — R351 Nocturia: Secondary | ICD-10-CM

## 2016-05-02 DIAGNOSIS — K120 Recurrent oral aphthae: Secondary | ICD-10-CM

## 2016-05-02 DIAGNOSIS — M2550 Pain in unspecified joint: Secondary | ICD-10-CM | POA: Diagnosis not present

## 2016-05-02 MED ORDER — PREDNISONE 20 MG PO TABS: ORAL_TABLET | ORAL | Status: DC

## 2016-05-02 MED ORDER — MELOXICAM 15 MG PO TABS: ORAL_TABLET | ORAL | Status: DC

## 2016-05-02 MED ORDER — PRAVASTATIN SODIUM 20 MG PO TABS: ORAL_TABLET | ORAL | Status: DC

## 2016-05-02 MED ORDER — HYDROCODONE-ACETAMINOPHEN 10-325 MG PO TABS: 1.0000 | ORAL_TABLET | Freq: Four times a day (QID) | ORAL | Status: DC | PRN

## 2016-05-02 MED ORDER — HYDROCODONE-ACETAMINOPHEN 10-325 MG PO TABS
1.0000 | ORAL_TABLET | ORAL | Status: DC | PRN
Start: 2016-05-02 — End: 2016-07-26

## 2016-05-02 MED ORDER — HYDROCODONE-ACETAMINOPHEN 10-325 MG PO TABS: 1.0000 | ORAL_TABLET | ORAL | Status: DC | PRN

## 2016-05-02 NOTE — Patient Instructions (Signed)
     IF you received an x-ray today, you will receive an invoice from [facility]. Please contact [facility] at [phone] with questions or concerns regarding your invoice.   IF you received labwork today, you will receive an invoice from [facility]. Please contact [facility] at [phone] with questions or concerns regarding your invoice.   Our billing staff will not be able to assist you with questions regarding bills from these companies.  You will be contacted with the lab results as soon as they are available. The fastest way to get your results is to activate your My Chart account. Instructions are located on the last page of this paperwork. If you have not heard from us regarding the results in 2 weeks, please contact this office.      

## 2016-05-02 NOTE — Progress Notes (Addendum)
Subjective:    Patient ID: Troy Obrien, male    DOB: 1951-07-21, 65 y.o.   MRN: [id number] By signing my name below, I, Javier Docker, attest that this documentation has been prepared under the direction and in the presence of [name], MD. Electronically Signed: Javier Docker, ER Scribe. 05/02/2016. 3:16 PM.  Chief Complaint  Patient presents with  . Sinus Problem    pt. says an ongoing problem    HPI HPI Comments: Troy Obrien is a 65 y.o. male who presents to [facility] complaining of continued severe sinus congestion. He spoke with Dr. Vickey Huger and he was told that his insurance will cover the cost of a sleep study at his home, but will not cover a sleep study at the sleep center. He has been using the Afrin nasal spray many times per day and cannot breath out of his nose at all at night. His urinary sx have also improved.  Is is continuing to suffer from burning of his tongue. He does not get ulcers on his genitalia.   He called two months after his last visit stating he did not understand instruction on the medication prescribed to him two months prior. I did not change any of his medication doses at last visit. He has been on pravastatin  for over a year and he stated he was unaware he was supposed to take it daily. He has been on doxizosen 1 and 1/2 tablets for over six months and was also unaware of that dose. He is on hydrocodone  5 tabs per day and he will be due for a prescription on 07/13. [facility] reviewed and pt has been filling scripts on the day he is allowed but otherwise compliant.   Past Medical History  Diagnosis Date  . Headache(784.0)   . Hypertension   . Hyperlipidemia   . Arthritis   . Inguinal hernia   . Gout   . Glaucoma   . Rickets, fetal   . Vitamin D deficiency    Allergies  Allergen Reactions  . Laxative [Bisacodyl] Hives  . Cyclobenzaprine Other (See Comments)    Hand twitching/spasms   Current Outpatient Prescriptions  on File Prior to Visit  Medication Sig Dispense Refill  . amLODipine (NORVASC) 5 MG tablet TAKE 1 TABLET BY MOUTH EVERY DAY 90 tablet 1  . aspirin 81 MG tablet Take 81 mg by mouth daily.    . Azelastine-Fluticasone 137-50 MCG/ACT SUSP Place 1 spray into the nose 2 (two) times daily. 1 Bottle 11  . bimatoprost (LUMIGAN) 0.01 % SOLN Place 1 drop into both eyes at bedtime.    Marland Kitchen doxazosin (CARDURA) 4 MG tablet Take 1.5 tablets (6 mg total) by mouth at bedtime. 90 tablet 0  . doxazosin (CARDURA) 4 MG tablet Take 1/2 tab po qam and 1 tab po qhs 135 tablet 3  . HYDROcodone-acetaminophen (NORCO) 10-325 MG tablet Take 1 tablet by mouth every 4 (four) hours as needed for moderate pain. 150 tablet 0  . lidocaine (XYLOCAINE) 2 % solution Use as directed 20 mLs in the mouth or throat as needed for mouth pain. 100 mL 1  . meloxicam (MOBIC) 15 MG tablet TAKE 1 TABLET BY MOUTH EVERY DAY FOR PAIN AND INFLAMMATION 30 tablet 2  . oxymetazoline (AFRIN NASAL SPRAY) 0.05 % nasal spray Place 1 spray into both nostrils 2 (two) times daily. 30 mL 0  . pravastatin (PRAVACHOL) 20 MG tablet TAKE 1 TABLET (20 MG  TOTAL) BY MOUTH AT BEDTIME. 90 tablet 0  . sildenafil (VIAGRA) 100 MG tablet Take 1/4 to 1/2 tablet po qd as needed for erectile dysfunction  30 min to 4 hr prior to intercourse 5 tablet 11   No current facility-administered medications on file prior to visit.    Review of Systems  Constitutional: Positive for fatigue. Negative for fever and chills.  HENT: Positive for congestion and sinus pressure.   Respiratory: Negative for shortness of breath and wheezing.   Psychiatric/Behavioral: Positive for sleep disturbance.       Objective:  BP 128/80 mmHg  Pulse 55  Temp(Src) 97.8 F (36.6 C) (Oral)  Resp 16  Ht 5\' 6"  (1.676 m)  Wt 239 lb (108.41 kg)  BMI 38.59 kg/m2  SpO2 98%  Physical Exam  Constitutional: He is oriented to person, place, and time. He appears well-developed and well-nourished. No  distress.  HENT:  Head: Normocephalic and atraumatic.  TMs normal. Left nare has some patency, but right nare is completely occluded.   Eyes: Pupils are equal, round, and reactive to light.  Neck: Neck supple.  Cardiovascular: Normal rate.   Pulmonary/Chest: Effort normal. No respiratory distress.  Musculoskeletal: Normal range of motion.  Neurological: He is alert and oriented to person, place, and time. Coordination normal.  Skin: Skin is warm and dry. He is not diaphoretic.  Psychiatric: He has a normal mood and affect. His behavior is normal.  Nursing note and vitals reviewed.     Assessment & Plan:   Will try to staff message Dr. Lottie DawsonBond, [facility] to see if pt has any possible finding would be concerning for poss Behcet's - this seems unlikely, pt is unaware of any opthomalogic ulcer hx.  If sinus sx worsen, call in abx.  1. Prediabetes - recheck a1c and bmp since last cr was slightly elev at 1.4  2. Oral aphthous ulcer - Neurologist Dr. Vickey Hugerohmeier recommended pt be further evaluated for poss Behcet's syndrome due to constellation of oral ulcers, cilliary injections and skin rash, and arthropathies  3. Nasal obstruction - really horrendously severe w/o improvement after [facility] surg and [facility] has now told pt that they have nothing else to offer him - now accidentally hooked on Afrin for >1 wk since misunderstanding Dr. Oliva Bustardohmeier's instructions - using now >4x/s from starting qhs - so will do taper of oral prednisone while he stops afrin. I rec pt pursue ENT eval at a tertiary care teaching hosp which he willing to do but is feeling defeated/hopeless for any poss of relief.  4. Other fatigue   5. Obstructive sleep apnea - has sxs but as Dr. Vickey Hugerohmeier notes, a nasal CPAP would be useless however, it is just a matter of time until pt has an irreversible comorbidity or a MVA from the daytime sedation as he clearly has florid sleep apnea. May need to proceed with PSG  anyway though wondering if a dental device might have more benefit than a cpap??  6. BPH associated with nocturia - sxs very well controlled on doxazosin  7. Essential hypertension   8. Hyperlipidemia   9. Arthralgia     Orders Placed This Encounter  Procedures  . C-reactive protein    Standing Status: Future     Number of Occurrences:      Standing Expiration Date: 06/02/2017  . Comprehensive metabolic panel    Standing Status: Future     Number of Occurrences:      Standing Expiration Date:  06/02/2016  . Sedimentation rate    Standing Status: Future     Number of Occurrences:      Standing Expiration Date: 06/02/2017  . Hemoglobin A1c    Standing Status: Future     Number of Occurrences:      Standing Expiration Date: 06/02/2017  . Ambulatory referral to ENT    Referral Priority:  Routine    Referral Type:  Consultation    Referral Reason:  Specialty Services Required    Requested Specialty:  Otolaryngology    Number of Visits Requested:  1  . Care order/instruction    AVS and GO after labs are drawn    Scheduling Instructions:     AVS and GO    Meds ordered this encounter  Medications  . Loratadine (CLARITIN PO)    Sig: Take by mouth.  . DISCONTD: HYDROcodone-acetaminophen (NORCO) 10-325 MG tablet    Sig: Take 1-2 tablets by mouth every 6 (six) hours as needed for moderate pain.    Dispense:  150 tablet    Refill:  0    May fill on 05/03/2016  . HYDROcodone-acetaminophen (NORCO) 10-325 MG tablet    Sig: Take 1 tablet by mouth every 4 (four) hours as needed.    Dispense:  150 tablet    Refill:  0    May fill June 02, 2016  . HYDROcodone-acetaminophen (NORCO) 10-325 MG tablet    Sig: Take 1 tablet by mouth every 4 (four) hours as needed.    Dispense:  150 tablet    Refill:  0    May fill July 02, 2016  . pravastatin (PRAVACHOL) 20 MG tablet    Sig: TAKE 1 TABLET (20 MG TOTAL) BY MOUTH AT BEDTIME.    Dispense:  90 tablet    Refill:  3  . meloxicam (MOBIC) 15 MG  tablet    Sig: TAKE 1 TABLET BY MOUTH EVERY DAY as needed FOR PAIN AND INFLAMMATION    Dispense:  30 tablet    Refill:  2  . predniSONE (DELTASONE) 20 MG tablet    Sig: Take 4 tabs po x 1 tomorrow, then take 3 tabs qd x 3d, then 2 tabs qd x 3d then 1 tab qd x 3d.    Dispense:  22 tablet    Refill:  0  . HYDROcodone-acetaminophen (NORCO) 10-325 MG tablet    Sig: Take 1-2 tablets by mouth every 4 (four) hours as needed for moderate pain.    Dispense:  150 tablet    Refill:  0    May fill today on 05/02/2016    I personally performed the services described in this documentation, which was scribed in my presence. The recorded information has been reviewed and considered, and addended by me as needed.   Norberto SorensonEva Daphine Loch, M.D.  [facility]  [place] [address] [place], [place] [address] [phone] phone [phone] fax  05/02/2016 8:10 PM

## 2016-05-02 NOTE — Telephone Encounter (Signed)
Patient came to the walk and was informed that he cannot be seen until he pays his balance on $390. Patient wants to know if he can get a refill for hydrocodone until he comes up with the money to pay the balance off. Please advise! [phone]

## 2016-05-04 ENCOUNTER — Other Ambulatory Visit (INDEPENDENT_AMBULATORY_CARE_PROVIDER_SITE_OTHER): Payer: Managed Care, Other (non HMO) | Admitting: Family Medicine

## 2016-05-04 DIAGNOSIS — R5383 Other fatigue: Secondary | ICD-10-CM

## 2016-05-04 DIAGNOSIS — N401 Enlarged prostate with lower urinary tract symptoms: Secondary | ICD-10-CM

## 2016-05-04 DIAGNOSIS — E785 Hyperlipidemia, unspecified: Secondary | ICD-10-CM

## 2016-05-04 DIAGNOSIS — R351 Nocturia: Secondary | ICD-10-CM

## 2016-05-04 DIAGNOSIS — I10 Essential (primary) hypertension: Secondary | ICD-10-CM

## 2016-05-04 DIAGNOSIS — R7303 Prediabetes: Secondary | ICD-10-CM

## 2016-05-04 DIAGNOSIS — M2550 Pain in unspecified joint: Secondary | ICD-10-CM

## 2016-05-04 LAB — COMPREHENSIVE METABOLIC PANEL
ALBUMIN: 4.3 g/dL (ref 3.6–5.1)
ALT: 12 U/L (ref 9–46)
AST: 11 U/L (ref 10–35)
Alkaline Phosphatase: 39 U/L — ABNORMAL LOW (ref 40–115)
BILIRUBIN TOTAL: 0.4 mg/dL (ref 0.2–1.2)
BUN: 26 mg/dL — ABNORMAL HIGH (ref 7–25)
CHLORIDE: 105 mmol/L (ref 98–110)
CO2: 23 mmol/L (ref 20–31)
CREATININE: 1.24 mg/dL (ref 0.70–1.25)
Calcium: 9.4 mg/dL (ref 8.6–10.3)
Glucose, Bld: 120 mg/dL — ABNORMAL HIGH (ref 65–99)
Potassium: 4.6 mmol/L (ref 3.5–5.3)
SODIUM: 137 mmol/L (ref 135–146)
TOTAL PROTEIN: 7.6 g/dL (ref 6.1–8.1)

## 2016-05-04 LAB — SEDIMENTATION RATE: SED RATE: 8 mm/h (ref 0–20)

## 2016-05-04 LAB — C-REACTIVE PROTEIN: CRP: <0.5 mg/dL (ref <0.60)

## 2016-05-05 LAB — HEMOGLOBIN A1C
Hgb A1c MFr Bld: 6.2 % — ABNORMAL HIGH (ref <5.7)
Mean Plasma Glucose: 131 mg/dL

## 2016-05-05 NOTE — Telephone Encounter (Signed)
Pt was seen and given Rxs at OV.

## 2016-05-21 ENCOUNTER — Ambulatory Visit (INDEPENDENT_AMBULATORY_CARE_PROVIDER_SITE_OTHER): Payer: Managed Care, Other (non HMO)

## 2016-05-21 ENCOUNTER — Ambulatory Visit (INDEPENDENT_AMBULATORY_CARE_PROVIDER_SITE_OTHER): Payer: Managed Care, Other (non HMO) | Admitting: Family Medicine

## 2016-05-21 VITALS — BP 120/72 | HR 71 | Temp 97.3°F | Resp 22 | Ht 66.0 in | Wt 239.0 lb

## 2016-05-21 DIAGNOSIS — Z79899 Other long term (current) drug therapy: Secondary | ICD-10-CM | POA: Diagnosis not present

## 2016-05-21 DIAGNOSIS — R05 Cough: Secondary | ICD-10-CM

## 2016-05-21 DIAGNOSIS — R059 Cough, unspecified: Secondary | ICD-10-CM

## 2016-05-21 DIAGNOSIS — M791 Myalgia: Secondary | ICD-10-CM | POA: Diagnosis not present

## 2016-05-21 DIAGNOSIS — J329 Chronic sinusitis, unspecified: Secondary | ICD-10-CM

## 2016-05-21 DIAGNOSIS — M7918 Myalgia, other site: Secondary | ICD-10-CM

## 2016-05-21 LAB — COMPLETE METABOLIC PANEL WITH GFR
ALBUMIN: 4 g/dL (ref 3.6–5.1)
ALK PHOS: 40 U/L (ref 40–115)
ALT: 16 U/L (ref 9–46)
AST: 15 U/L (ref 10–35)
BILIRUBIN TOTAL: 0.6 mg/dL (ref 0.2–1.2)
BUN: 16 mg/dL (ref 7–25)
CO2: 24 mmol/L (ref 20–31)
Calcium: 9.2 mg/dL (ref 8.6–10.3)
Chloride: 107 mmol/L (ref 98–110)
Creat: 1.3 mg/dL — ABNORMAL HIGH (ref 0.70–1.25)
GFR, EST AFRICAN AMERICAN: 67 mL/min (ref >=60)
GFR, EST NON AFRICAN AMERICAN: 58 mL/min — AB (ref >=60)
Glucose, Bld: 160 mg/dL — ABNORMAL HIGH (ref 65–99)
Potassium: 4.4 mmol/L (ref 3.5–5.3)
Sodium: 141 mmol/L (ref 135–146)
TOTAL PROTEIN: 7.1 g/dL (ref 6.1–8.1)

## 2016-05-21 LAB — POCT CBC
Granulocyte percent: 62.8 %G (ref 37–80)
HEMATOCRIT: 39 % — AB (ref 43.5–53.7)
HEMOGLOBIN: 13.2 g/dL — AB (ref 14.1–18.1)
LYMPH, POC: 1.6 (ref 0.6–3.4)
MCH: 27 pg (ref 27–31.2)
MCHC: 33.8 g/dL (ref 31.8–35.4)
MCV: 80 fL (ref 80–97)
MID (cbc): 0.5 (ref 0–0.9)
MPV: 7.6 fL (ref 0–99.8)
POC GRANULOCYTE: 3.5 (ref 2–6.9)
POC LYMPH PERCENT: 28.7 %L (ref 10–50)
POC MID %: 8.5 % (ref 0–12)
Platelet Count, POC: 226 10*3/uL (ref 142–424)
RBC: 4.88 M/uL (ref 4.69–6.13)
RDW, POC: 15.7 %
WBC: 5.5 10*3/uL (ref 4.6–10.2)

## 2016-05-21 MED ORDER — AZITHROMYCIN 250 MG PO TABS: ORAL_TABLET | ORAL | Status: DC

## 2016-05-21 MED ORDER — ALBUTEROL SULFATE (2.5 MG/3ML) 0.083% IN NEBU
2.5000 mg | INHALATION_SOLUTION | Freq: Once | RESPIRATORY_TRACT | Status: AC
  Administered 2016-05-21: 2.5 mg via RESPIRATORY_TRACT

## 2016-05-21 MED ORDER — HYDROCOD POLST-CPM POLST ER 10-8 MG/5ML PO SUER: 5.0000 mL | Freq: Two times a day (BID) | ORAL | Status: DC | PRN

## 2016-05-21 NOTE — Patient Instructions (Addendum)
Alternate heat and ice.  Stretch, lay on a tennis ball or foam roller with your hip. Try aspercreme. If the pain continues, call and I will be happy to refer you to PT (or orthopedics if you would prefer.) You are already on the meloxicam and hydrocodone for your back and as you do not want to take a muscle relaxant there is not many options for pain as far as pills go.   IF you received an x-ray today, you will receive an invoice from [facility]. Please contact [facility] at [phone] with questions or concerns regarding your invoice.   IF you received labwork today, you will receive an invoice from [employer]. Please contact [facility] at [phone] with questions or concerns regarding your invoice.   Our billing staff will not be able to assist you with questions regarding bills from these companies.  You will be contacted with the lab results as soon as they are available. The fastest way to get your results is to activate your My Chart account. Instructions are located on the last page of this paperwork. If you have not heard from [place] regarding the results in 2 weeks, please contact this office.    Gluteal Strain A gluteal strain happens when the muscles in the buttocks (gluteal muscles) are overstretched or torn. A tear can be partial or complete. A gluteal strain can cause pain and stiffness in your buttocks, legs, and lower back. A strain might be referred to as "pulling a muscle." The severity of a gluteal strain is rated in degrees. First-degree strains have the least amount of muscle tearing and pain. Second-degree and third-degrees strains have increasingly more tearing and pain.  CAUSES  There are many possible causes of gluteal strain, such as:   Stretching the muscles too far.   Putting too much stress on the muscles before they are warmed up.   Overusing the muscles.   Repetitive muscle movements over long periods of time.    Injury.  RISK FACTORS This condition is more likely to develop in:   People who are in cold weather.   People who are physically tired.  People with poor strength and flexibility.   People who do not warm up properly before physical activity.   People who do exercises or play sports with sudden bursts of activity.  People who have poor exercise technique. SYMPTOMS  Symptoms of this condition include:  Pain in the buttocks, especially when moving the legs. Pain may spread to the lower back or the legs.  Bruising and swelling of the buttocks.  Tenderness, weakness, or stiffness in the buttocks.  Muscle spasms. DIAGNOSIS  This condition is diagnosed based on a physical exam and medical history. Your health care provider may do some range of motion exercises with you. You may have tests, such as MRI or X-rays.  Your strain may be rated based on how severe it is. The ratings are:   Grade 1 strain (mild). Your muscles are overstretched. You might have very small muscle tears. This type of strain generally heals in about 1 week.   Grade 2 strain (moderate). Your muscles are partially torn. This may take 1 to 2 months to heal.  Grade 3 strain (severe). Your muscles are completely torn. A severe strain can take more than three months to heal. Grade 3 gluteal strains are rare. TREATMENT  Treatment for this condition includes resting, icing, and raising (elevating) the injured area as much as possible. Your  health care provider may recommend over-the-counter pain medicines. If your gluteal strain is severe or very painful, your health care provider may prescribe pain medicines or physical therapy. Surgery for severe strains is rare. HOME CARE INSTRUCTIONS   Take over-the-counter and prescription medicines only as told by your health care provider.  Do not drive or operate heavy machinery while taking prescription pain medicine.  Return to your normal activities as told by  your health care provider. Ask your health care provider what activities are safe for you.  Rest your gluteal muscles as much as possible, especially for the first 2-3 days.  Begin exercising or stretching as told by your health care provider.  If directed, apply ice to the injured area:  Put ice in a plastic bag.  Place a towel between your skin and the bag.  Leave the ice on for 20 minutes, 2-3 times per day.  Keep all follow-up visits as told by your health care provider. This is important. PREVENTION   Warm up and stretch before physical activity.   Stretch after physical activity.   Learn and use correct techniques for exercising and playing sports.  Avoid difficult physical activities if your muscles are tired or sore.   Strengthen your gluteal muscles and the surrounding muscles.   Develop your flexibility by stretching at least once a day.  SEEK MEDICAL CARE IF:   You have pain or swelling that gets worse or does not get better with medicine.   You have "shooting" pain that moves through your leg.  SEEK IMMEDIATE MEDICAL CARE IF:  You develop weakness in part of your body.   You lose feeling in part of your body.  You have severe pain.   You are unable to walk.  You have difficulty controlling your bladder or bowel movements.   This information is not intended to replace advice given to you by your health care provider. Make sure you discuss any questions you have with your health care provider.   Document Released: 08/14/2009 Document Revised: 07/08/2015 Document Reviewed: 03/04/2015 Elsevier Interactive Patient Education [employer]

## 2016-05-21 NOTE — Progress Notes (Signed)
Subjective:    Patient ID: Troy Obrien, male    DOB: 1951-06-16, 65 y.o.   MRN: [id number] By signing my name below, I, Troy Obrien, attest that this documentation has been prepared under the direction and in the presence of Troy Sorenson, MD. Electronically Signed: Javier Obrien, ER Scribe. 05/21/2016. 2:16 PM.  Chief Complaint  Patient presents with  . Nasal Congestion  . Chills  . Generalized Body Aches  . Chest Pain  . Sore Throat  . Hip Pain    Right Hip   HPI HPI Comments: Troy Obrien is a 64 y.o. male who presents to [facility] complaining of sinus congestion, then sore throat and chest congestion that started on the 13th. He has taken mucinex and claritin. He has associated hot and cold spells. He has occasional bouts of light headedness.  He is also complaining of pain on his upper outer buttock that he thought could be related to his sickness or related to the way he sits in his office chair.   He was seen in clinic three weeks ago in clinic for routine medical care and at that point he had developed an afrin addition. In order to get him off the afrin we did a steroid taper which ended about the time that his current illness began. He has been referred to [facility] ten days ago.   Past Medical History  Diagnosis Date  . Headache(784.0)   . Hypertension   . Hyperlipidemia   . Arthritis   . Inguinal hernia   . Gout   . Glaucoma   . Rickets, fetal   . Vitamin D deficiency    Allergies  Allergen Reactions  . Laxative [Bisacodyl] Hives  . Cyclobenzaprine Other (See Comments)    Hand twitching/spasms   Current Outpatient Prescriptions on File Prior to Visit  Medication Sig Dispense Refill  . aspirin 81 MG tablet Take 81 mg by mouth daily.    . bimatoprost (LUMIGAN) 0.01 % SOLN Place 1 drop into both eyes at bedtime.    Marland Kitchen doxazosin (CARDURA) 4 MG tablet Take 1/2 tab po qam and 1 tab po qhs 135 tablet 3  . HYDROcodone-acetaminophen (NORCO) 10-325 MG tablet Take 1  tablet by mouth every 4 (four) hours as needed. 150 tablet 0  . lidocaine (XYLOCAINE) 2 % solution Use as directed 20 mLs in the mouth or throat as needed for mouth pain. 100 mL 1  . Loratadine (CLARITIN PO) Take by mouth.    . meloxicam (MOBIC) 15 MG tablet TAKE 1 TABLET BY MOUTH EVERY DAY as needed FOR PAIN AND INFLAMMATION 30 tablet 2  . pravastatin (PRAVACHOL) 20 MG tablet TAKE 1 TABLET (20 MG TOTAL) BY MOUTH AT BEDTIME. 90 tablet 3  . amLODipine (NORVASC) 5 MG tablet TAKE 1 TABLET BY MOUTH EVERY DAY (Patient not taking: Reported on 05/21/2016) 90 tablet 1  . Azelastine-Fluticasone 137-50 MCG/ACT SUSP Place 1 spray into the nose 2 (two) times daily. (Patient not taking: Reported on 05/21/2016) 1 Bottle 11  . HYDROcodone-acetaminophen (NORCO) 10-325 MG tablet Take 1 tablet by mouth every 4 (four) hours as needed. (Patient not taking: Reported on 05/21/2016) 150 tablet 0  . HYDROcodone-acetaminophen (NORCO) 10-325 MG tablet Take 1-2 tablets by mouth every 4 (four) hours as needed for moderate pain. (Patient not taking: Reported on 05/21/2016) 150 tablet 0  . sildenafil (VIAGRA) 100 MG tablet Take 1/4 to 1/2 tablet po qd as needed for erectile dysfunction  30  min to 4 hr prior to intercourse (Patient not taking: Reported on 05/21/2016) 5 tablet 11   No current facility-administered medications on file prior to visit.    Review of Systems  Constitutional: Positive for fever, chills and fatigue.  HENT: Positive for congestion, rhinorrhea and sore throat.   Musculoskeletal: Positive for myalgias, back pain and arthralgias.  Neurological: Positive for light-headedness.      Objective:  BP 120/72 mmHg  Pulse 71  Temp(Src) 97.3 F (36.3 C) (Oral)  Resp 22  Ht 5\' 6"  (1.676 m)  Wt 239 lb (108.41 kg)  BMI 38.59 kg/m2  SpO2 94%  Physical Exam  Constitutional: He is oriented to person, place, and time. He appears well-developed and well-nourished. No distress.  HENT:  Head: Normocephalic and  atraumatic.  TMs normal. Nares pale with clear rhinitis. Bilateral obstruction from soft tissue at baseline. Oropharynx clear but small canal.   Eyes: Pupils are equal, round, and reactive to light.  Neck: Neck supple.  No cervical adenopathy. Normal thyroid.   Cardiovascular: Normal rate.   Pulmonary/Chest: Effort normal. No respiratory distress. He has rales.  Right lower lobe expiratory rales.  Musculoskeletal: Normal range of motion.  Pain in the right SI joint and the upper outer gluteal.   Neurological: He is alert and oriented to person, place, and time. Coordination normal.  Skin: Skin is warm and dry. He is not diaphoretic.  Psychiatric: He has a normal mood and affect. His behavior is normal.  Nursing note and vitals reviewed.  Results for orders placed or performed in visit on 05/21/16  COMPLETE METABOLIC PANEL WITH GFR  Result Value Ref Range   Sodium 141 135 - 146 mmol/L   Potassium 4.4 3.5 - 5.3 mmol/L   Chloride 107 98 - 110 mmol/L   CO2 24 20 - 31 mmol/L   Glucose, Bld 160 (H) 65 - 99 mg/dL   BUN 16 7 - 25 mg/dL   Creat 1.77 (H) 1.16 - 1.25 mg/dL   Total Bilirubin 0.6 0.2 - 1.2 mg/dL   Alkaline Phosphatase 40 40 - 115 U/L   AST 15 10 - 35 U/L   ALT 16 9 - 46 U/L   Total Protein 7.1 6.1 - 8.1 g/dL   Albumin 4.0 3.6 - 5.1 g/dL   Calcium 9.2 8.6 - 57.9 mg/dL   GFR, Est African American 67 >=60 mL/min   GFR, Est Non African American 58 (L) >=60 mL/min  POCT CBC  Result Value Ref Range   WBC 5.5 4.6 - 10.2 K/uL   Lymph, poc 1.6 0.6 - 3.4   POC LYMPH PERCENT 28.7 10 - 50 %L   MID (cbc) 0.5 0 - 0.9   POC MID % 8.5 0 - 12 %M   POC Granulocyte 3.5 2 - 6.9   Granulocyte percent 62.8 37 - 80 %G   RBC 4.88 4.69 - 6.13 M/uL   Hemoglobin 13.2 (A) 14.1 - 18.1 g/dL   HCT, POC 03.8 (A) 33.3 - 53.7 %   MCV 80.0 80 - 97 fL   MCH, POC 27.0 27 - 31.2 pg   MCHC 33.8 31.8 - 35.4 g/dL   RDW, POC 83.2 %   Platelet Count, POC 226 142 - 424 K/uL   MPV 7.6 0 - 99.8 fL   Dg  Chest 2 View  05/21/2016  CLINICAL DATA:  Chronic sinusitis, cough, chills for 1 week. EXAM: CHEST  2 VIEW COMPARISON:  Chest x-ray dated 10/28/2013. FINDINGS: The heart  size and mediastinal contours are within normal limits. Both lungs are clear. Mild degenerative change again noted within the thoracic spine. IMPRESSION: No active cardiopulmonary disease.  No evidence of pneumonia. Electronically Signed   By: Bary Richard M.D.   On: 05/21/2016 15:06       Assessment & Plan:   1. Chronic sinusitis, unspecified location   2. Cough   3. Gluteal pain - already on meloxicam and hydrocodone. Pt "can't take muscle relaxers". Advised stretch, massage, ice/heat, or topicals. If cont cons PT or ortho  4. Polypharmacy     Orders Placed This Encounter  Procedures  . DG Chest 2 View    Standing Status: Future     Number of Occurrences: 1     Standing Expiration Date: 05/21/2017    Order Specific Question:  Reason for Exam (SYMPTOM  OR DIAGNOSIS REQUIRED)    Answer:  cough x 10d, RLL rales    Order Specific Question:  Preferred imaging location?    Answer:  External  . COMPLETE METABOLIC PANEL WITH GFR  . POCT CBC    Meds ordered this encounter  Medications  . chlorpheniramine-HYDROcodone (TUSSIONEX PENNKINETIC ER) 10-8 MG/5ML SUER    Sig: Take 5 mLs by mouth every 12 (twelve) hours as needed.    Dispense:  120 mL    Refill:  0  . albuterol (PROVENTIL) (2.5 MG/3ML) 0.083% nebulizer solution 2.5 mg    Sig:   . azithromycin (ZITHROMAX) 250 MG tablet    Sig: Take 2 tabs PO x 1 dose, then 1 tab PO QD x 4 days    Dispense:  6 tablet    Refill:  0    I personally performed the services described in this documentation, which was scribed in my presence. The recorded information has been reviewed and considered, and addended by me as needed.   Troy Obrien, M.D.  [facility] [address] [place], [place] [address] [phone] phone [phone]  fax  05/21/2016 9:27 PM

## 2016-05-22 ENCOUNTER — Encounter: Payer: Self-pay | Admitting: Family Medicine

## 2016-07-26 ENCOUNTER — Ambulatory Visit (INDEPENDENT_AMBULATORY_CARE_PROVIDER_SITE_OTHER): Payer: Managed Care, Other (non HMO) | Admitting: Family Medicine

## 2016-07-26 VITALS — BP 128/80 | HR 54 | Temp 98.1°F | Resp 17 | Ht 67.5 in | Wt 249.0 lb

## 2016-07-26 DIAGNOSIS — G894 Chronic pain syndrome: Secondary | ICD-10-CM

## 2016-07-26 DIAGNOSIS — L03012 Cellulitis of left finger: Secondary | ICD-10-CM | POA: Diagnosis not present

## 2016-07-26 DIAGNOSIS — L600 Ingrowing nail: Secondary | ICD-10-CM | POA: Diagnosis not present

## 2016-07-26 DIAGNOSIS — B351 Tinea unguium: Secondary | ICD-10-CM | POA: Diagnosis not present

## 2016-07-26 MED ORDER — HYDROCODONE-ACETAMINOPHEN 10-325 MG PO TABS: 1.0000 | ORAL_TABLET | ORAL | 0 refills | Status: DC | PRN

## 2016-07-26 MED ORDER — CEPHALEXIN 500 MG PO CAPS: 500.0000 mg | ORAL_CAPSULE | Freq: Four times a day (QID) | ORAL | 0 refills | Status: DC

## 2016-07-26 NOTE — Patient Instructions (Addendum)
IF you received an x-ray today, you will receive an invoice from [facility]. Please contact [facility] at [phone] with questions or concerns regarding your invoice.   IF you received labwork today, you will receive an invoice from [facility]. Please contact [facility] at [phone] with questions or concerns regarding your invoice.   Our billing staff will not be able to assist you with questions regarding bills from these companies.  You will be contacted with the lab results as soon as they are available. The fastest way to get your results is to activate your My Chart account. Instructions are located on the last page of this paperwork. If you have not heard from us regarding the results in 2 weeks, please contact this office.     Ingrown Toenail An ingrown toenail occurs when the corner or sides of your toenail grow into the surrounding skin. The big toe is most commonly affected, but it can happen to any of your toes. If your ingrown toenail is not treated, you will be at risk for infection. CAUSES This condition may be caused by:  Wearing shoes that are too small or tight.  Injury or trauma, such as stubbing your toe or having your toe stepped on.  Improper cutting or care of your toenails.  Being born with (congenital) nail or foot abnormalities, such as having a nail that is too big for your toe. RISK FACTORS Risk factors for an ingrown toenail include:  Age. Your nails tend to thicken as you get older, so ingrown nails are more common in older people.  Diabetes.  Cutting your toenails incorrectly.  Blood circulation problems. SYMPTOMS Symptoms may include:  Pain, soreness, or tenderness.  Redness.  Swelling.  Hardening of the skin surrounding the toe. Your ingrown toenail may be infected if there is fluid, pus, or drainage. DIAGNOSIS  An ingrown toenail may be diagnosed by medical history and physical  exam. If your toenail is infected, your health care provider may test a sample of the drainage. TREATMENT Treatment depends on the severity of your ingrown toenail. Some ingrown toenails may be treated at home. More severe or infected ingrown toenails may require surgery to remove all or part of the nail. Infected ingrown toenails may also be treated with antibiotic medicines. HOME CARE INSTRUCTIONS  If you were prescribed an antibiotic medicine, finish all of it even if you start to feel better.  Soak your foot in warm soapy water for 20 minutes, 3 times per day or as directed by your health care provider.  Carefully lift the edge of the nail away from the sore skin by wedging a small piece of cotton under the corner of the nail. This may help with the pain. Be careful not to cause more injury to the area.  Wear shoes that fit well. If your ingrown toenail is causing you pain, try wearing sandals, if possible.  Trim your toenails regularly and carefully. Do not cut them in a curved shape. Cut your toenails straight across. This prevents injury to the skin at the corners of the toenail.  Keep your feet clean and dry.  If you are having trouble walking and are given crutches by your health care provider, use them as directed.  Do not pick at your toenail or try to remove it yourself.  Take medicines only as directed by your health care provider.  Keep all follow-up visits as directed by your health care provider. This is  important. SEEK MEDICAL CARE IF:  Your symptoms do not improve with treatment. SEEK IMMEDIATE MEDICAL CARE IF:  You have red streaks that start at your foot and go up your leg.  You have a fever.  You have increased redness, swelling, or pain.  You have fluid, blood, or pus coming from your toenail.   This information is not intended to replace advice given to you by your health care provider. Make sure you discuss any questions you have with your health care  provider.   Document Released: 10/14/2000 Document Revised: 03/03/2015 Document Reviewed: 09/10/2014 Elsevier Interactive Patient Education [employer]

## 2016-07-26 NOTE — Progress Notes (Signed)
By signing my name below, I, Troy Obrien, attest that this documentation has been prepared under the direction and in the presence of Troy SorensonEva Rachael Ferrie, MD.  Electronically Signed: Arvilla MarketMesha Obrien, Medical Scribe. 07/26/16. 6:43 PM.  Subjective:    Patient ID: Troy Obrien, male    DOB: 1951/09/06, 65 y.o.   MRN: [id number]  HPI Chief Complaint  Patient presents with  . Toe Injury    left side     HPI Comments: Troy Obrien is a 65 y.o. male who presents to the [facility] complaining of left great toe nail injury onset last night. Pt reports having reoccuring ingrown toe nails in both great toes. Pt removed "a big piece" of his ingrown toe nail on his left foot and used hydrogen peroxide to clean it afterwards. Pt states he's in less pain than he was in last night before he tried to remove his ingrown toe nail, but reports tenderness.  Patient Active Problem List   Diagnosis Date Noted  . Hyperlipidemia 05/02/2016  . Hypersomnia with sleep apnea 02/17/2016  . Snoring 02/17/2016  . Nocturia more than twice per night 02/17/2016  . NO (nasal obstruction) 02/17/2016  . Sleep related headaches 02/17/2016  . Morbid obesity due to excess calories (HCC) 02/17/2016  . Vitamin D deficiency 02/04/2015  . Other chronic sinusitis 02/04/2015  . Rhinitis, allergic 02/04/2015  . BPH associated with nocturia 05/10/2014  . Polypharmacy 04/13/2014  . Raynaud phenomenon 04/13/2014  . Erectile dysfunction 04/13/2014  . Type 2 diabetes mellitus (HCC) 04/13/2014  . Thoracic back pain 03/26/2014  . Right groin pain 02/20/2013  . Arthritis of hand 07/08/2012  . Fatigue 07/08/2012  . Sleep apnea 07/08/2012  . Hernia 07/08/2012  . Inguinal hernia, right 03/30/2012  . Right upper quadrant pain 03/30/2012  . Back pain 03/30/2012  . Hypertension 02/02/2012  . Glaucoma 02/02/2012   Past Medical History:  Diagnosis Date  . Arthritis   . Glaucoma   . Gout   . Headache(784.0)   .  Hyperlipidemia   . Hypertension   . Inguinal hernia   . Rickets, fetal   . Vitamin D deficiency    Past Surgical History:  Procedure Laterality Date  . carpel tunnel     bilateral  . CIRCUMCISION  1972  . INGUINAL HERNIA REPAIR  10/11/2012   Procedure: HERNIA REPAIR INGUINAL ADULT;  Surgeon: Robyne AskewPaul S Toth III, MD;  [facility];  Service: General;  Laterality: Right;  . INSERTION OF MESH  10/11/2012   Procedure: INSERTION OF MESH;  Surgeon: Robyne AskewPaul S Toth III, MD;  [facility];  Service: General;  Laterality: Right;  . TONSILLECTOMY  1963   Allergies  Allergen Reactions  . Laxative [Bisacodyl] Hives  . Cyclobenzaprine Other (See Comments)    Hand twitching/spasms   Prior to Admission medications   Medication Sig Start Date End Date Taking? Authorizing Provider  amLODipine (NORVASC) 5 MG tablet TAKE 1 TABLET BY MOUTH EVERY DAY 01/26/16  Yes Sherren MochaEva N Toris Laverdiere, MD  aspirin 81 MG tablet Take 81 mg by mouth daily.   Yes Historical Provider, MD  Azelastine-Fluticasone 137-50 MCG/ACT SUSP Place 1 spray into the nose 2 (two) times daily. 01/02/16  Yes Sherren MochaEva N Kshawn Canal, MD  bimatoprost (LUMIGAN) 0.01 % SOLN Place 1 drop into both eyes at bedtime.   Yes Historical Provider, MD  doxazosin (CARDURA) 4 MG tablet Take 1/2 tab po qam and 1 tab po qhs 02/23/16  Yes  Sherren Mocha, MD  HYDROcodone-acetaminophen [facility]) 10-325 MG tablet Take 1 tablet by mouth every 4 (four) hours as needed. 05/02/16  Yes Sherren Mocha, MD  HYDROcodone-acetaminophen (NORCO) 10-325 MG tablet Take 1 tablet by mouth every 4 (four) hours as needed. 05/02/16  Yes Sherren Mocha, MD  HYDROcodone-acetaminophen (NORCO) 10-325 MG tablet Take 1-2 tablets by mouth every 4 (four) hours as needed for moderate pain. 05/02/16  Yes Sherren Mocha, MD  lidocaine (XYLOCAINE) 2 % solution Use as directed 20 mLs in the mouth or throat as needed for mouth pain. 01/02/16  Yes Sherren Mocha, MD  Loratadine (CLARITIN PO) Take by mouth.   Yes Historical Provider, MD  meloxicam  (MOBIC) 15 MG tablet TAKE 1 TABLET BY MOUTH EVERY DAY as needed FOR PAIN AND INFLAMMATION 05/02/16  Yes Sherren Mocha, MD  pravastatin (PRAVACHOL) 20 MG tablet TAKE 1 TABLET (20 MG TOTAL) BY MOUTH AT BEDTIME. 05/02/16  Yes Sherren Mocha, MD  sildenafil (VIAGRA) 100 MG tablet Take 1/4 to 1/2 tablet po qd as needed for erectile dysfunction  30 min to 4 hr prior to intercourse 09/07/15  Yes Sherren Mocha, MD  chlorpheniramine-HYDROcodone [facility]) 10-8 MG/5ML SUER Take 5 mLs by mouth every 12 (twelve) hours as needed. Patient not taking: Reported on 07/26/2016 05/21/16   Sherren Mocha, MD   Social History   Social History  . Marital status: Married    Spouse name: N/A  . Number of children: N/A  . Years of education: N/A   Occupational History  . Not on file.   Social History Main Topics  . Smoking status: Never Smoker  . Smokeless tobacco: Never Used  . Alcohol use 0.0 oz/week     Comment: rare  . Drug use: No  . Sexual activity: Yes    Birth control/ protection: None   Other Topics Concern  . Not on file   Social History Narrative  . No narrative on file   Depression screen [facility] 2/9 07/26/2016 05/21/2016 05/02/2016 01/02/2016 11/29/2015  Decreased Interest 0 0 0 0 0  Down, Depressed, Hopeless 0 0 0 0 0  PHQ - 2 Score 0 0 0 0 0   Review of Systems  Constitutional: Negative for activity change, appetite change, fever and unexpected weight change.  HENT: Positive for congestion. Negative for drooling, facial swelling, nosebleeds, postnasal drip, rhinorrhea and sinus pressure.   Musculoskeletal: Positive for arthralgias, back pain and myalgias. Negative for gait problem and joint swelling.  Skin: Positive for wound.  Psychiatric/Behavioral: Negative for dysphoric mood.    Objective:  Physical Exam  Constitutional: He appears well-developed and well-nourished. No distress.  HENT:  Head: Normocephalic and atraumatic.  Eyes: Conjunctivae are normal.  Neck: Neck supple.    Cardiovascular: Normal rate.   Pulmonary/Chest: Effort normal.  Neurological: He is alert.  Skin: Skin is warm and dry.  Medial aspect of his left great nail bed has a cut down to the distal 3rd with small amount of purulent discharge at the distal aspect with palpation No heat fluctuance or induration Mild tenderness and erythema Nail bed mildly thickened, white and cracking  Psychiatric: He has a normal mood and affect. His behavior is normal.  Nursing note and vitals reviewed.   BP 128/80 (BP Location: Right Arm, Patient Position: Sitting, Cuff Size: Normal)   Pulse (!) 54   Temp 98.1 F (36.7 C) (Oral)   Resp 17   Ht 5' 7.5" (  1.715 m)   Wt 249 lb (112.9 kg)   SpO2 95%   BMI 38.42 kg/m   Assessment & Plan:   1. Ingrown left greater toenail   2. Onychomycosis of toenail   3. Paronychia, left    Reviewed warm soaks and top antibiotic, treat ingrown nail by pealing back skin after soak and try to get wisp of cotton between nail and skin.  Try vics vaporub for the mild nail fungus  4. Chronic pain syndrome. Refilled chronic narcotics  Meds ordered this encounter  Medications  . cephALEXin (KEFLEX) 500 MG capsule    Sig: Take 1 capsule (500 mg total) by mouth 4 (four) times daily.    Dispense:  28 capsule    Refill:  0  . DISCONTD: HYDROcodone-acetaminophen (NORCO) 10-325 MG tablet    Sig: Take 1 tablet by mouth every 4 (four) hours as needed.    Dispense:  150 tablet    Refill:  0    May fill Sep 21, 2016  . DISCONTD: HYDROcodone-acetaminophen (NORCO) 10-325 MG tablet    Sig: Take 1 tablet by mouth every 4 (four) hours as needed.    Dispense:  150 tablet    Refill:  0    May fill July 26, 2016  . DISCONTD: HYDROcodone-acetaminophen (NORCO) 10-325 MG tablet    Sig: Take 1-2 tablets by mouth every 4 (four) hours as needed for moderate pain.    Dispense:  150 tablet    Refill:  0    May fill today on 08/23/2016  . DISCONTD: HYDROcodone-acetaminophen (NORCO)  10-325 MG tablet    Sig: Take 1 tablet by mouth every 4 (four) hours as needed.    Dispense:  150 tablet    Refill:  0    May fill Sep 21, 2016  . DISCONTD: HYDROcodone-acetaminophen (NORCO) 10-325 MG tablet    Sig: Take 1 tablet by mouth every 4 (four) hours as needed.    Dispense:  150 tablet    Refill:  0    May fill today on July 26, 2016  . DISCONTD: HYDROcodone-acetaminophen (NORCO) 10-325 MG tablet    Sig: Take 1-2 tablets by mouth every 4 (four) hours as needed for moderate pain.    Dispense:  150 tablet    Refill:  0    May fill on 08/23/2016  . HYDROcodone-acetaminophen (NORCO) 10-325 MG tablet    Sig: Take 1 tablet by mouth every 4 (four) hours as needed.    Dispense:  150 tablet    Refill:  0    May fill Sep 21, 2016  . HYDROcodone-acetaminophen (NORCO) 10-325 MG tablet    Sig: Take 1-2 tablets by mouth every 4 (four) hours as needed.    Dispense:  150 tablet    Refill:  0    May fill today on July 26, 2016  . HYDROcodone-acetaminophen (NORCO) 10-325 MG tablet    Sig: Take 1-2 tablets by mouth every 4 (four) hours as needed for moderate pain.    Dispense:  150 tablet    Refill:  0    May fill on 08/23/2016    I personally performed the services described in this documentation, which was scribed in my presence. The recorded information has been reviewed and considered, and addended by me as needed.   Troy Sorenson, M.D.  [facility] [address] [place], [place] [address] [phone] phone [phone] fax  07/31/16 11:53 PM

## 2016-08-10 ENCOUNTER — Other Ambulatory Visit: Payer: Self-pay | Admitting: Family Medicine

## 2016-08-10 NOTE — Telephone Encounter (Signed)
Can we refill his Mobic

## 2016-08-10 NOTE — Telephone Encounter (Signed)
Patient advised.

## 2016-08-10 NOTE — Telephone Encounter (Signed)
Over the past 6 mos, pt's renal function has been slightly reduced - not in anyway that is going to clinically matter but I certainly don't want it to cont to worsen and chronic nsaid use can be really hard on the kidneys so will recommend decreasing meloxicam from 15 to 7.5mg  qd - just use prn, not scheduled daily, and do not use with any other otc pain medication other than tylenol/acetaminophen - so no aleve, ibuprofen, motrin, advil, etc.

## 2016-08-15 ENCOUNTER — Other Ambulatory Visit: Payer: Self-pay | Admitting: Family Medicine

## 2016-08-16 ENCOUNTER — Other Ambulatory Visit: Payer: Self-pay | Admitting: Family Medicine

## 2016-08-16 NOTE — Telephone Encounter (Signed)
Well I guess, first pt should come back in and let me swab his muoth and then refer - we will talk about it at his next OV.

## 2016-08-16 NOTE — Telephone Encounter (Signed)
If pt is still having oral mouth ulcers, then lets refer him to derm for further evaluation of this.  Or ENT might have an opinion.  Does he have a dermatologist and ok with referral there? Ok to refill lidocaine as well.

## 2016-08-16 NOTE — Telephone Encounter (Signed)
Dr Clelia CroftShaw, you saw pt for mouth ulcers in July and Rxd this with 1 RF. Copied notes here from OV plan: Oral aphthous ulcer - Neurologist Dr. Vickey Hugerohmeier recommended pt be further evaluated for poss Behcet's syndrome due to constellation of oral ulcers, cilliary injections and skin rash, and arthropathies.  Do you want to give more RFs? Didn't know if you wanted to run further tests or refer pt to a specialist?

## 2016-08-17 NOTE — Telephone Encounter (Signed)
Patient advised. He does not have insurance right now and should be back in 2 months, then will make an appointment to see us. "cant afford ov right now"

## 2016-10-06 ENCOUNTER — Encounter: Payer: Self-pay | Admitting: Family Medicine

## 2016-10-06 ENCOUNTER — Ambulatory Visit (INDEPENDENT_AMBULATORY_CARE_PROVIDER_SITE_OTHER): Payer: Managed Care, Other (non HMO)

## 2016-10-06 ENCOUNTER — Ambulatory Visit (INDEPENDENT_AMBULATORY_CARE_PROVIDER_SITE_OTHER): Payer: Managed Care, Other (non HMO) | Admitting: Family Medicine

## 2016-10-06 VITALS — BP 124/80 | HR 80 | Temp 98.4°F | Resp 16 | Ht 67.5 in | Wt 247.0 lb

## 2016-10-06 DIAGNOSIS — I10 Essential (primary) hypertension: Secondary | ICD-10-CM

## 2016-10-06 DIAGNOSIS — M62830 Muscle spasm of back: Secondary | ICD-10-CM

## 2016-10-06 DIAGNOSIS — M546 Pain in thoracic spine: Secondary | ICD-10-CM | POA: Diagnosis not present

## 2016-10-06 DIAGNOSIS — Z79899 Other long term (current) drug therapy: Secondary | ICD-10-CM | POA: Diagnosis not present

## 2016-10-06 DIAGNOSIS — S6991XA Unspecified injury of right wrist, hand and finger(s), initial encounter: Secondary | ICD-10-CM

## 2016-10-06 DIAGNOSIS — W108XXA Fall (on) (from) other stairs and steps, initial encounter: Secondary | ICD-10-CM

## 2016-10-06 DIAGNOSIS — D649 Anemia, unspecified: Secondary | ICD-10-CM

## 2016-10-06 DIAGNOSIS — E11620 Type 2 diabetes mellitus with diabetic dermatitis: Secondary | ICD-10-CM | POA: Diagnosis not present

## 2016-10-06 DIAGNOSIS — G8929 Other chronic pain: Secondary | ICD-10-CM

## 2016-10-06 DIAGNOSIS — E785 Hyperlipidemia, unspecified: Secondary | ICD-10-CM | POA: Diagnosis not present

## 2016-10-06 DIAGNOSIS — G894 Chronic pain syndrome: Secondary | ICD-10-CM

## 2016-10-06 MED ORDER — HYDROCODONE-ACETAMINOPHEN 10-325 MG PO TABS: 1.0000 | ORAL_TABLET | ORAL | 0 refills | Status: DC | PRN

## 2016-10-06 NOTE — Patient Instructions (Signed)
     IF you received an x-ray today, you will receive an invoice from [facility]. Please contact [facility] at [phone] with questions or concerns regarding your invoice.   IF you received labwork today, you will receive an invoice from [facility]. Please contact [facility] at [phone] with questions or concerns regarding your invoice.   Our billing staff will not be able to assist you with questions regarding bills from these companies.  You will be contacted with the lab results as soon as they are available. The fastest way to get your results is to activate your My Chart account. Instructions are located on the last page of this paperwork. If you have not heard from us regarding the results in 2 weeks, please contact this office.      

## 2016-10-06 NOTE — Progress Notes (Signed)
Subjective:    Patient ID: Troy Obrien, male    DOB: [birth year], 65 y.o.   MRN: [id number] Chief Complaint  Patient presents with  . Fall    on Sat. 10/02/16, fell downsteps, hurt back mid to lower, and hands    HPI He fell down 4-5 steps concrete at a parade on Sat - 4d prior.  He really doesn't know how he landed - he threw his hands out in front of him  - right>left with swelling hands.  Larey Seat forward and jarred his back.  Has been using top peroxide which has helped some.  THe mid and lower back - from half ways down is hurting on botbh sides of his back.  No radiation down arms or legs, no b/b changes, no tingling, no weakness.   No alarm sxs.  Toe improving some  Depression screen [facility] 2/9 10/06/2016 07/26/2016 05/21/2016 05/02/2016 01/02/2016  Decreased Interest 0 0 0 0 0  Down, Depressed, Hopeless 0 0 0 0 0  PHQ - 2 Score 0 0 0 0 0     Past Medical History:  Diagnosis Date  . Arthritis   . Glaucoma   . Gout   . Headache(784.0)   . Hyperlipidemia   . Hypertension   . Inguinal hernia   . Rickets, fetal   . Vitamin D deficiency    Past Surgical History:  Procedure Laterality Date  . carpel tunnel     bilateral  . CIRCUMCISION  1972  . INGUINAL HERNIA REPAIR  10/11/2012   Procedure: HERNIA REPAIR INGUINAL ADULT;  Surgeon: Robyne Askew, MD;  Location: [facility] OR;  Service: General;  Laterality: Right;  . INSERTION OF MESH  10/11/2012   Procedure: INSERTION OF MESH;  Surgeon: Robyne Askew, MD;  Location: [facility] OR;  Service: General;  Laterality: Right;  . TONSILLECTOMY  1963   Current Outpatient Prescriptions on File Prior to Visit  Medication Sig Dispense Refill  . amLODipine (NORVASC) 5 MG tablet TAKE 1 TABLET BY MOUTH EVERY DAY 90 tablet 1  . aspirin 81 MG tablet Take 81 mg by mouth daily.    . Azelastine-Fluticasone 137-50 MCG/ACT SUSP Place 1 spray into the nose 2 (two) times daily. 1 Bottle 11  . bimatoprost (LUMIGAN) 0.01 % SOLN Place 1 drop into both eyes at  bedtime.    Marland Kitchen doxazosin (CARDURA) 4 MG tablet Take 1/2 tab po qam and 1 tab po qhs 135 tablet 3  . lidocaine (XYLOCAINE) 2 % solution TAKE BY MOUTH OR THROAT AS NEEDED FOR MOUTH PAIN 100 mL 2  . pravastatin (PRAVACHOL) 20 MG tablet TAKE 1 TABLET (20 MG TOTAL) BY MOUTH AT BEDTIME. 90 tablet 3  . Loratadine (CLARITIN PO) Take by mouth.    . meloxicam (MOBIC) 7.5 MG tablet Take 1 tablet (7.5 mg total) by mouth daily as needed for pain. (Patient not taking: Reported on 10/06/2016) 30 tablet 2  . sildenafil (VIAGRA) 100 MG tablet Take 1/4 to 1/2 tablet po qd as needed for erectile dysfunction  30 min to 4 hr prior to intercourse (Patient not taking: Reported on 10/06/2016) 5 tablet 11   No current facility-administered medications on file prior to visit.    Allergies  Allergen Reactions  . Laxative [Bisacodyl] Hives  . Cyclobenzaprine Other (See Comments)    Hand twitching/spasms   No family history on file. Social History   Social History  . Marital status: Married    Spouse name:  N/A  . Number of children: N/A  . Years of education: N/A   Social History Main Topics  . Smoking status: Never Smoker  . Smokeless tobacco: Never Used  . Alcohol use 0.0 oz/week     Comment: rare  . Drug use: No  . Sexual activity: Yes    Birth control/ protection: None   Other Topics Concern  . None   Social History Narrative  . None     Review of Systems  Constitutional: Positive for activity change. Negative for appetite change, chills and fever.  HENT: Positive for congestion, rhinorrhea and sinus pressure. Negative for facial swelling.   Gastrointestinal: Negative for abdominal pain, constipation and diarrhea.  Genitourinary: Negative for decreased urine volume, difficulty urinating, frequency and urgency.  Musculoskeletal: Positive for arthralgias, back pain, gait problem, joint swelling and myalgias.  Skin: Negative for color change and wound.  Neurological: Negative for dizziness,  weakness, light-headedness and numbness.  Psychiatric/Behavioral: Positive for sleep disturbance.       Objective:   Physical Exam  Constitutional: He is oriented to person, place, and time. He appears well-developed and well-nourished. No distress.  HENT:  Head: Normocephalic and atraumatic.  Nose: Mucosal edema and rhinorrhea present.  Nares occluded with soft tissue and clear rhinitis Audible nasal breathing  Eyes: Conjunctivae are normal. Pupils are equal, round, and reactive to light. No scleral icterus.  Neck: Normal range of motion. Neck supple. No thyromegaly present.  Cardiovascular: Normal rate, regular rhythm, normal heart sounds and intact distal pulses.   Pulmonary/Chest: Effort normal and breath sounds normal. No respiratory distress.  Musculoskeletal: He exhibits no edema.  Lymphadenopathy:    He has no cervical adenopathy.  Neurological: He is alert and oriented to person, place, and time.  Skin: Skin is warm and dry. He is not diaphoretic.  Psychiatric: He has a normal mood and affect. His behavior is normal.   BP 124/80   Pulse 80   Temp 98.4 F (36.9 C) (Oral)   Resp 16   Ht 5' 7.5" (1.715 m) Comment: with shoes  Wt 247 lb (112 kg) Comment: with shoes  SpO2 96%   BMI 38.11 kg/m      Assessment & Plan:  Last labs 5 mos prior  1. Fall (on) (from) other stairs and steps, initial encounter   2. Type 2 diabetes mellitus with diabetic dermatitis, without long-term current use of insulin (HCC)   3. Essential hypertension   4. Chronic right-sided thoracic back pain   5. Polypharmacy   6. Morbid obesity due to excess calories (HCC)   7. Hyperlipidemia, unspecified hyperlipidemia type   8. Right wrist injury, initial encounter - X-ray shows arthritis. No acute injury. [place]   9. Paraspinal muscle spasm   10. Chronic pain syndrome   11. Anemia, unspecified type    Addendum: Unfortunately patient's UDS collected today returned showing oxycodone which she has  not been prescribed. Review of the [place] database shows that he has only been receiving controlled medications from myself of hydrocodone 10 mg #150 per month. He last received oxycodone 5 mg #10 pills only one year prior from my colleague. Patient has a significant amount of hydrocodone and its metabolites in his urine showing that he has been taking his prescribed medication but unfortunately it appears he has been also taking someone else's prescribed within the last several days. Patient did present with an acute injury several days prior but he was requesting an early refill of his hydrocodone since  he had used more than prescribed. Even with an acute injury to increase his own dose and combining it with other medication is incredibly dangerous and puts patient at very high risk for unintentional overdose. Unfortunately, patient's past few years has been fraught with frequent injuries requiring early refills. Patient's UDS did not have any other controlled or illegal substance and then other than opiates so it is possible that it is that his pain is just undertreated but even if that is the case I am no longer willing to prescribe to him as he will require doses that I am not comfortable and familiar with managing. Will discuss with patient that I will need to wean him off of his opiates. Perhaps he could find a pain management clinic in the interim who would be willing to see this delightful gentleman.    Orders Placed This Encounter  Procedures  . DG Wrist Complete Right    Standing Status:   Future    Number of Occurrences:   1    Standing Expiration Date:   10/06/2017    Order Specific Question:   Reason for Exam (SYMPTOM  OR DIAGNOSIS REQUIRED)    Answer:   wrist pain on ventral radial aspect, s/p fall 4d prior    Order Specific Question:   Preferred imaging location?    Answer:   External  . [facility] (MW), Urine  . Comprehensive metabolic panel  . Hemoglobin A1c  . CBC    Meds  ordered this encounter  Medications  . HYDROcodone-acetaminophen (NORCO) 10-325 MG tablet    Sig: Take 1-2 tablets by mouth every 4 (four) hours as needed.    Dispense:  180 tablet    Refill:  0    May fill today.  Marland Kitchen. HYDROcodone-acetaminophen (NORCO) 10-325 MG tablet    Sig: Take 1 tablet by mouth every 4 (four) hours as needed.    Dispense:  150 tablet    Refill:  0    May fill today on November 06, 2016  . HYDROcodone-acetaminophen (NORCO) 10-325 MG tablet    Sig: Take 1 tablet by mouth every 4 (four) hours as needed for moderate pain.    Dispense:  150 tablet    Refill:  0    May fill on 12/07/2016    [name], M.D.  [facility]  [place] [address] [place], [place] [address] [phone] phone [phone] fax  11/13/16 12:48 PM  Results for orders placed or performed in visit on 10/06/16  [facility] (MW), Urine  Result Value Ref Range   [facility] FINAL   Comprehensive metabolic panel  Result Value Ref Range   Glucose 97 65 - 99 mg/dL   BUN 16 8 - 27 mg/dL   Creatinine, Ser 1.301.10 0.76 - 1.27 mg/dL   GFR calc non Af Amer 71 >59 mL/min/1.73   GFR calc Af Amer 82 >59 mL/min/1.73   BUN/Creatinine Ratio 15 10 - 24   Sodium 141 134 - 144 mmol/L   Potassium 4.7 3.5 - 5.2 mmol/L   Chloride 104 96 - 106 mmol/L   CO2 23 18 - 29 mmol/L   Calcium 9.3 8.6 - 10.2 mg/dL   Total Protein 7.6 6.0 - 8.5 g/dL   Albumin 4.2 3.6 - 4.8 g/dL   Globulin, Total 3.4 1.5 - 4.5 g/dL   Albumin/Globulin Ratio 1.2 1.2 - 2.2   Bilirubin Total 0.5 0.0 - 1.2 mg/dL  Alkaline Phosphatase 46 39 - 117 IU/L   AST 19 0 - 40 IU/L   ALT 15 0 - 44 IU/L  Hemoglobin A1c  Result Value Ref Range   Hgb A1c MFr Bld 6.3 (H) 4.8 - 5.6 %   Est. average glucose Bld gHb Est-mCnc 134 mg/dL  CBC  Result Value Ref Range   WBC 4.7 3.4 - 10.8 x10E3/uL   RBC 5.35 4.14 - 5.80 x10E6/uL   Hemoglobin 14.2 13.0 - 17.7 g/dL   Hematocrit 11.942.6 14.737.5 - 51.0 %   MCV 80 79 - 97  fL   MCH 26.5 (L) 26.6 - 33.0 pg   MCHC 33.3 31.5 - 35.7 g/dL   RDW 82.914.7 56.212.3 - 13.015.4 %   Platelets 255 150 - 379 x10E3/uL

## 2016-10-07 LAB — HEMOGLOBIN A1C
Est. average glucose Bld gHb Est-mCnc: 134 mg/dL
Hgb A1c MFr Bld: 6.3 % — ABNORMAL HIGH (ref 4.8–5.6)

## 2016-10-07 LAB — COMPREHENSIVE METABOLIC PANEL
A/G RATIO: 1.2 (ref 1.2–2.2)
ALBUMIN: 4.2 g/dL (ref 3.6–4.8)
ALT: 15 IU/L (ref 0–44)
AST: 19 IU/L (ref 0–40)
Alkaline Phosphatase: 46 IU/L (ref 39–117)
BUN / CREAT RATIO: 15 (ref 10–24)
BUN: 16 mg/dL (ref 8–27)
Bilirubin Total: 0.5 mg/dL (ref 0.0–1.2)
CALCIUM: 9.3 mg/dL (ref 8.6–10.2)
CO2: 23 mmol/L (ref 18–29)
Chloride: 104 mmol/L (ref 96–106)
Creatinine, Ser: 1.1 mg/dL (ref 0.76–1.27)
GFR, EST AFRICAN AMERICAN: 82 mL/min/{1.73_m2} (ref >59)
GFR, EST NON AFRICAN AMERICAN: 71 mL/min/{1.73_m2} (ref >59)
GLUCOSE: 97 mg/dL (ref 65–99)
Globulin, Total: 3.4 g/dL (ref 1.5–4.5)
Potassium: 4.7 mmol/L (ref 3.5–5.2)
Sodium: 141 mmol/L (ref 134–144)
TOTAL PROTEIN: 7.6 g/dL (ref 6.0–8.5)

## 2016-10-07 LAB — CBC
HEMATOCRIT: 42.6 % (ref 37.5–51.0)
Hemoglobin: 14.2 g/dL (ref 13.0–17.7)
MCH: 26.5 pg — ABNORMAL LOW (ref 26.6–33.0)
MCHC: 33.3 g/dL (ref 31.5–35.7)
MCV: 80 fL (ref 79–97)
Platelets: 255 10*3/uL (ref 150–379)
RBC: 5.35 x10E6/uL (ref 4.14–5.80)
RDW: 14.7 % (ref 12.3–15.4)
WBC: 4.7 10*3/uL (ref 3.4–10.8)

## 2016-10-13 LAB — TOXASSURE SELECT 13 (MW), URINE

## 2016-11-23 ENCOUNTER — Telehealth: Payer: Self-pay

## 2016-11-23 NOTE — Telephone Encounter (Signed)
Pt requesting a call from Dr Clelia CroftShaw. States that she will know what he is talking about, something about scheduling a procedure. Understands that she is with patients and likely will not be able to return the call today.

## 2016-11-23 NOTE — Telephone Encounter (Signed)
Will you call or do you want me to get more info?

## 2016-11-23 NOTE — Telephone Encounter (Signed)
I will call 

## 2016-11-24 ENCOUNTER — Telehealth: Payer: Self-pay

## 2016-11-24 DIAGNOSIS — Z5181 Encounter for therapeutic drug level monitoring: Secondary | ICD-10-CM

## 2016-11-24 NOTE — Telephone Encounter (Signed)
Pt is wanting to talk with dr Clelia Croftshaw about when she wants him to come in for his ingrown toe nail since she told him she wanted to be here when he came in  Best number [phone]

## 2016-11-25 NOTE — Telephone Encounter (Signed)
Please schedule an appt with dr Clelia Croftshaw for this

## 2016-11-29 NOTE — Telephone Encounter (Signed)
Pt has an appt sched with me at [facility] on Thurs 2/1 5 pm for ingrown toenail.  I think he will need his toenail removed as he has been dealing with the for a while and I do not do that procedure nor do we have the supplies for this at [facility].  Please call pt and let him know that I recommend he change his appt to [facility] w/ one of our PAs.

## 2016-11-30 NOTE — Telephone Encounter (Signed)
Patient is fine with changing his appointment to be at [address] and to see a PA--the only concern he had was that he was told that only Dr Clelia CroftShaw could refill his medications and he also needed some of those refilled. I let him know that we would change him to the [facility] building and that I would speak to Dr Clelia CroftShaw about getting him some refills on his medications.   So is there anyway one of the PA's tomorrow could refill them or could Dr Clelia Croftshaw just refill them for him.  Thank you

## 2016-11-30 NOTE — Telephone Encounter (Signed)
Please advise 

## 2016-11-30 NOTE — Telephone Encounter (Signed)
I do not take off ingrown toenails nor are we equipped to do this at 104.  Pt has appt with me on 2/1 at 104 at 5 pm - please call him immed to reschedule. Needs to be seen at 102 by one of our wonderful PAs.   He was just referring to the fact that I have informed he that he cannot receive any narcotic pain medicine from any of my colleagues and he was concerned he would need an increased dose after nail removal so I told him that if I was in the building when he had it off I could address that and if not, my colleagues can send me a message.  Please reschedule pt asap.  There is a second phone note about this same thing.

## 2016-11-30 NOTE — Telephone Encounter (Signed)
If pt needs a refill on his controlled substances, he needs to be seen by myself - doing medication refills and removal of a toenail is to much at a 15 min visit - this will require separate visits. One of my colleagues cannot refill his controlled meds.  If he needs a refill on uncontrolled med he can have pharmacy send refill request;  I will enter future lab orders I would like to have done at his visit tomorrow.

## 2016-12-01 ENCOUNTER — Ambulatory Visit (INDEPENDENT_AMBULATORY_CARE_PROVIDER_SITE_OTHER): Payer: Managed Care, Other (non HMO) | Admitting: Family Medicine

## 2016-12-01 VITALS — BP 136/80 | HR 85 | Temp 97.9°F | Resp 16 | Ht 67.0 in | Wt 250.0 lb

## 2016-12-01 DIAGNOSIS — L600 Ingrowing nail: Secondary | ICD-10-CM | POA: Diagnosis not present

## 2016-12-01 DIAGNOSIS — Z5181 Encounter for therapeutic drug level monitoring: Secondary | ICD-10-CM

## 2016-12-01 MED ORDER — CEPHALEXIN 500 MG PO CAPS: 500.0000 mg | ORAL_CAPSULE | Freq: Three times a day (TID) | ORAL | 0 refills | Status: DC

## 2016-12-01 NOTE — Patient Instructions (Addendum)
Follow up in 1 week for toe re check and pain medicine appointment with Dr. Clelia Croft.  Keflex 500 mg three times daily for 10 days.   INGROWN TOENAIL . Keep area clean, dry and bandaged for 24 hours. . After 24 hours, remove outer bandage and leave yellow gauze in place. Troy Obrien toe/foot in warm soapy water for 5-10 minutes, once daily for 5 days. Rebandage toe after each cleaning. . Continue soaks until yellow gauze falls off. . Notify the office if you experience any of the following signs of infection: Swelling, redness, pus drainage, streaking, fever > 101.0 F    IF you received an x-ray today, you will receive an invoice from [facility]. Please contact [facility] at [phone] with questions or concerns regarding your invoice.   IF you received labwork today, you will receive an invoice from [place]. Please contact [facility] at [phone] with questions or concerns regarding your invoice.   Our billing staff will not be able to assist you with questions regarding bills from these companies.  You will be contacted with the lab results as soon as they are available. The fastest way to get your results is to activate your My Chart account. Instructions are located on the last page of this paperwork. If you have not heard from [place] regarding the results in 2 weeks, please contact this office.      Ingrown Hair An ingrown hair is a hair that curls and re-enters the skin instead of growing straight out of the skin. It happens most often with curly hair. It is usually more severe in the neck area, but it can occur in any shaved area, including the beard area, groin, scalp, and legs. An ingrown hair may cause small pockets of infection. CAUSES  Shaving closely, tweezing, or waxing, especially curly hair. Using hair removal creams can sometimes lead to ingrown hairs, especially in the groin. SYMPTOMS   Small bumps on the skin. The bumps may be filled with  pus.  Pain.  Itching. DIAGNOSIS  Your caregiver can usually tell what is wrong by doing a physical exam. TREATMENT  If there is a severe infection, your caregiver may prescribe antibiotic medicines. Laser hair removal may also be done to help prevent regrowth of the hair. HOME CARE INSTRUCTIONS   Do not shave irritated skin. You may start shaving again once the irritation has gone away.  If you are prone to ingrown hairs, consider not shaving as much as possible.  If antibiotics are prescribed, take them as directed. Finish them even if you start to feel better.  You may use a facial sponge in a gentle circular motion to help dislodge ingrown hairs on the face.  You may use a hair removal cream weekly, especially on the legs and underarms. Stop using the cream if it irritates your skin. Use caution when using hair removal creams in the groin area. SHAVING INSTRUCTIONS AFTER TREATMENT  Shower before shaving. Keep areas to be shaved packed in warm, moist wraps for several minutes before shaving. The warm, moist environment helps soften the hairs and makes ingrown hairs less likely to occur.  Use thick shaving gels.  Use a bump fighter razor that cuts hair slightly above the skin level or use an electric shaver with a longer shave setting.  Shave in the direction of hair growth. Avoid making multiple razor strokes.  Use moisturizing lotions after shaving. This information is not intended to replace advice given to you by your health  care provider. Make sure you discuss any questions you have with your health care provider. Document Released: 01/23/2001 Document Revised: 04/17/2012 Document Reviewed: 08/07/2015 Elsevier Interactive Patient Education  2017 [employer]

## 2016-12-01 NOTE — Progress Notes (Signed)
Patient ID: Troy Obrien, male    DOB: 21-Jun-1951, 66 y.o.   MRN: [id number]  PCP: [name], MD  Chief Complaint  Patient presents with  . Ingrown Toenail    left great toe    Subjective:  HPI 66 year old male presents for evaluation of ingrown toe nail. Patient reports pain has increasingly worsened.  Surrounding skin, specifically the top of the toe is significantly tender to touch. He denies any drainage of the skin surrounding the left great toe.  Social History   Social History  . Marital status: Married    Spouse name: N/A  . Number of children: N/A  . Years of education: N/A   Occupational History  . Not on file.   Social History Main Topics  . Smoking status: Never Smoker  . Smokeless tobacco: Never Used  . Alcohol use 0.0 oz/week     Comment: rare  . Drug use: No  . Sexual activity: Yes    Birth control/ protection: None   Other Topics Concern  . Not on file   Social History Narrative  . No narrative on file   Review of Systems See HPI  Patient Active Problem List   Diagnosis Date Noted  . Hyperlipidemia 05/02/2016  . Hypersomnia with sleep apnea 02/17/2016  . Snoring 02/17/2016  . Nocturia more than twice per night 02/17/2016  . NO (nasal obstruction) 02/17/2016  . Sleep related headaches 02/17/2016  . Morbid obesity due to excess calories (HCC) 02/17/2016  . Vitamin D deficiency 02/04/2015  . Other chronic sinusitis 02/04/2015  . Rhinitis, allergic 02/04/2015  . BPH associated with nocturia 05/10/2014  . Polypharmacy 04/13/2014  . Raynaud phenomenon 04/13/2014  . Erectile dysfunction 04/13/2014  . Type 2 diabetes mellitus (HCC) 04/13/2014  . Thoracic back pain 03/26/2014  . Right groin pain 02/20/2013  . Arthritis of hand 07/08/2012  . Fatigue 07/08/2012  . Sleep apnea 07/08/2012  . Hernia 07/08/2012  . Inguinal hernia, right 03/30/2012  . Right upper quadrant pain 03/30/2012  . Back pain 03/30/2012  . Hypertension 02/02/2012    . Glaucoma 02/02/2012    Allergies  Allergen Reactions  . Laxative [Bisacodyl] Hives  . Cyclobenzaprine Other (See Comments)    Hand twitching/spasms    Prior to Admission medications   Medication Sig Start Date End Date Taking? Authorizing Provider  amLODipine (NORVASC) 5 MG tablet TAKE 1 TABLET BY MOUTH EVERY DAY 08/10/16  Yes Sherren Mocha, MD  aspirin 81 MG tablet Take 81 mg by mouth daily.   Yes Historical Provider, MD  Azelastine-Fluticasone 137-50 MCG/ACT SUSP Place 1 spray into the nose 2 (two) times daily. 01/02/16  Yes Sherren Mocha, MD  bimatoprost (LUMIGAN) 0.01 % SOLN Place 1 drop into both eyes at bedtime.   Yes Historical Provider, MD  doxazosin (CARDURA) 4 MG tablet Take 1/2 tab po qam and 1 tab po qhs 02/23/16  Yes Sherren Mocha, MD  HYDROcodone-acetaminophen [facility]) 10-325 MG tablet Take 1-2 tablets by mouth every 4 (four) hours as needed. 10/06/16  Yes Sherren Mocha, MD  HYDROcodone-acetaminophen (NORCO) 10-325 MG tablet Take 1 tablet by mouth every 4 (four) hours as needed. 10/06/16  Yes Sherren Mocha, MD  HYDROcodone-acetaminophen (NORCO) 10-325 MG tablet Take 1 tablet by mouth every 4 (four) hours as needed for moderate pain. 10/06/16  Yes Sherren Mocha, MD  lidocaine (XYLOCAINE) 2 % solution TAKE BY MOUTH OR THROAT AS NEEDED FOR MOUTH PAIN 08/16/16  Yes Sherren MochaEva N Shaw, MD  Loratadine (CLARITIN PO) Take by mouth.   Yes Historical Provider, MD  meloxicam (MOBIC) 7.5 MG tablet Take 1 tablet (7.5 mg total) by mouth daily as needed for pain. 08/10/16  Yes Sherren MochaEva N Shaw, MD  pravastatin (PRAVACHOL) 20 MG tablet TAKE 1 TABLET (20 MG TOTAL) BY MOUTH AT BEDTIME. 05/02/16  Yes Sherren MochaEva N Shaw, MD  sildenafil (VIAGRA) 100 MG tablet Take 1/4 to 1/2 tablet po qd as needed for erectile dysfunction  30 min to 4 hr prior to intercourse 09/07/15  Yes Sherren MochaEva N Shaw, MD    Past Medical, Surgical Family and Social History reviewed and updated.    Objective:   Today's Vitals   12/01/16 1717  BP: 136/80  Pulse: 85   Resp: 16  Temp: 97.9 F (36.6 C)  TempSrc: Oral  SpO2: 96%  Weight: 250 lb (113.4 kg)  Height: 5\' 7"  (1.702 m)    Wt Readings from Last 3 Encounters:  12/01/16 250 lb (113.4 kg)  10/06/16 247 lb (112 kg)  07/26/16 249 lb (112.9 kg)   Physical Exam  Constitutional: He appears well-developed and well-nourished.  HENT:  Head: Normocephalic and atraumatic.  Eyes: Pupils are equal, round, and reactive to light.  Neck: Normal range of motion.  Cardiovascular: Normal rate.   Pulmonary/Chest: Effort normal.  Skin: Skin is warm. There is erythema.  Left great toe nail is ingrown and piercing into the skin of the lateral left toe  Psychiatric: He has a normal mood and affect.  Very anxious requiring frequent instruction.   Procedure Note  Verbal Consent Obtained. Left great toe wiped with alcohol prep pad, then anesthesia 10 cc of 2% Xylocaine w/o epinephrine.  Cleansed and prep with Betadine. Sterile drape applied. Complete great toenail lifted and excised, in its entirety. Proximal aspect of nail bed explored revealing no nail remnants. Hemostasis achieved. Xeroform dressing applied. Cleansed and dressed. Wound care instructions including precautions reviewed with patient.     Assessment & Plan:  1. Medication monitoring encounter (ordered by Dr. [name]) - ToxASSURE Select 13 (MW), Urine 2. Ingrown left greater toenail -Patient tolerated procedure  Toe nail care instructions provided.  Patient is to schedule a 1 week follow-up with D. [name].  Godfrey PickKimberly S. Tiburcio PeaHarris, MSN, FNP-C Primary Care at [facility] [phone]

## 2016-12-02 ENCOUNTER — Telehealth: Payer: Self-pay

## 2016-12-02 NOTE — Telephone Encounter (Signed)
Pt is needing pain meds for the ingrown toe nail that was removed yesterday   Best number [phone]

## 2016-12-02 NOTE — Telephone Encounter (Signed)
Seen by kim, in a pain contract with dr Clelia Croftshaw

## 2016-12-02 NOTE — Telephone Encounter (Signed)
I will not fill any

## 2016-12-03 ENCOUNTER — Telehealth: Payer: Self-pay | Admitting: Radiology

## 2016-12-03 ENCOUNTER — Telehealth: Payer: Self-pay | Admitting: Family Medicine

## 2016-12-03 NOTE — Telephone Encounter (Signed)
I spoke to patient and advised him of your instructions regarding alternating Tylenol and Ibuprofen. He has voiced understanding

## 2016-12-03 NOTE — Telephone Encounter (Signed)
Pt is already on 50mg  of hydrocodone a day and his last UDS showed additional oxycodone which he was not rx'ed.  Pt explained that he had just happened to take an oxycodone that was left over from a small amount rx'ed last year.  I am waiting for his repeat UDS collected 2d prior to return.  Pts that have toenails removed do not require narcotics- they tend to do well with otc meds so do not increase hydrocodone.

## 2016-12-03 NOTE — Telephone Encounter (Signed)
I returned call to patient via the phone number provided. A male and male answered the phone and I asked for verification of DOB, they asked who I was, I re-identified myself , where I was calling from and explained I was returning a call from yesterday. The male advised that she would provide a mailing address, I again explained the reason for my call and asked for DOB, another unidentified male voice began talking on the phone, and I advised all persons to contact the office and I disconnected the call.  -if patient calls back, advise to take Tylenol 500 mg every 4-6 hours for pain and alternate Ibuprofen 400 mg every 6 -8 hours for toe pain. He will need to follow-up with Dr. Clelia CroftShaw regarding any stronger pain medications.  Godfrey PickKimberly S. Tiburcio PeaHarris, MSN, FNP-C Primary Care at [facility] [phone]

## 2016-12-03 NOTE — Telephone Encounter (Signed)
This has already been done.

## 2016-12-05 NOTE — Telephone Encounter (Signed)
Got three rx 10/06/17 for hydrocodone, ok to early fill?

## 2016-12-05 NOTE — Telephone Encounter (Signed)
Sure, he can fill it a day early.

## 2016-12-05 NOTE — Telephone Encounter (Signed)
Pt would like to see if can refill his Hydrocodone early then Wed. The 7th, poss. Refill today  [phone]

## 2016-12-06 NOTE — Telephone Encounter (Signed)
[facility]

## 2016-12-07 LAB — TOXASSURE SELECT 13 (MW), URINE

## 2016-12-10 ENCOUNTER — Ambulatory Visit (INDEPENDENT_AMBULATORY_CARE_PROVIDER_SITE_OTHER): Payer: Managed Care, Other (non HMO) | Admitting: Family Medicine

## 2016-12-10 VITALS — BP 134/80 | HR 56 | Temp 97.5°F | Resp 16 | Ht 67.0 in | Wt 251.4 lb

## 2016-12-10 DIAGNOSIS — L600 Ingrowing nail: Secondary | ICD-10-CM

## 2016-12-10 DIAGNOSIS — G894 Chronic pain syndrome: Secondary | ICD-10-CM

## 2016-12-10 MED ORDER — HYDROCODONE-ACETAMINOPHEN 10-325 MG PO TABS: 1.0000 | ORAL_TABLET | ORAL | 0 refills | Status: DC | PRN

## 2016-12-10 NOTE — Progress Notes (Addendum)
Subjective:  By signing my name below, I, Troy Obrien, attest that this documentation has been prepared under the direction and in the presence of Troy SorensonEva Harlie Ragle, MD Electronically Signed: Charline BillsEssence Obrien, ED Scribe 12/10/2016 at 4:55 PM.   Patient ID: Troy FarrierEddie M Obrien, male    DOB: 05/09/1951, 66 y.o.   MRN: [id number]  Chief Complaint  Patient presents with  . Follow-up    left great toe   HPI Troy Obrien is a 66 y.o. male who [facility] at [facility] for a follow-up of left great toe. Pt was seen in the office on 12/01/16 for a left great ingrown toenail which was removed by Troy CourtsKimberly Harris, NP. Pt was started on Keflex and has 2 doses remaining.   Pain Management  Pt states that he is taking 5 mg of Norco around 7 AM once he gets to work, 11 AM, 2:30/3PM, 5 PM and 8 PM before bed. He reports increased back pain with lifting at work and when he's busier at work. 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 He occasionally takes 10 mg doses when pain is severe. Pt states that pain is 8/10 at worse even with hydrocodone and 3/10 at best which is on Sundays because he still has to carry his guitar. Pt declines starting gabapentin, Lyrica, Cymbalta, a muscle relaxer and any other medication at this time. States that he has tried gabapentin and a muscle relaxant in the past which caused reduced sensation in his fingers.   Past Medical History:  Diagnosis Date  . Arthritis   . Glaucoma   . Gout   . Headache(784.0)   . Hyperlipidemia   . Hypertension   . Inguinal hernia   . Rickets, fetal   . Vitamin D deficiency    Current Outpatient Prescriptions on File Prior to Visit  Medication Sig Dispense Refill  . amLODipine (NORVASC) 5 MG tablet TAKE 1 TABLET BY MOUTH EVERY DAY 90 tablet 1  . aspirin 81 MG tablet Take 81 mg by mouth daily.    . cephALEXin (KEFLEX) 500 MG  capsule Take 1 capsule (500 mg total) by mouth 3 (three) times daily. 30 capsule 0  . doxazosin (CARDURA) 4 MG tablet Take 1/2 tab po qam and 1 tab po qhs 135 tablet 3  . HYDROcodone-acetaminophen (NORCO) 10-325 MG tablet Take 1-2 tablets by mouth every 4 (four) hours as needed. 180 tablet 0  . HYDROcodone-acetaminophen (NORCO) 10-325 MG tablet Take 1 tablet by mouth every 4 (four) hours as needed. 150 tablet 0  . HYDROcodone-acetaminophen (NORCO) 10-325 MG tablet Take 1 tablet by mouth every 4 (four) hours as needed for moderate pain. 150 tablet 0  . lidocaine (XYLOCAINE) 2 % solution TAKE 20MLS BY MOUTH OR THROAT AS NEEDED FOR MOUTH PAIN 100 mL 2  . pravastatin (PRAVACHOL) 20 MG tablet TAKE 1 TABLET (20 MG TOTAL) BY MOUTH AT BEDTIME. 90 tablet 3  . Azelastine-Fluticasone 137-50 MCG/ACT SUSP Place 1 spray into the nose 2 (two) times daily. (Patient not taking: Reported on 12/10/2016) 1 Bottle 11  . bimatoprost (LUMIGAN) 0.01 % SOLN Place 1 drop into both eyes at bedtime.    . Loratadine (CLARITIN PO) Take by mouth.    . meloxicam (MOBIC) 7.5 MG tablet Take 1 tablet (7.5 mg total) by mouth daily as needed for pain. (Patient not taking:  Reported on 12/10/2016) 30 tablet 2  . sildenafil (VIAGRA) 100 MG tablet Take 1/4 to 1/2 tablet po qd as needed for erectile dysfunction  30 min to 4 hr prior to intercourse (Patient not taking: Reported on 12/10/2016) 5 tablet 11   No current facility-administered medications on file prior to visit.    Allergies  Allergen Reactions  . Laxative [Bisacodyl] Hives  . Cyclobenzaprine Other (See Comments)    Hand twitching/spasms   Past Surgical History:  Procedure Laterality Date  . carpel tunnel     bilateral  . CIRCUMCISION  1972  . INGUINAL HERNIA REPAIR  10/11/2012   Procedure: HERNIA REPAIR INGUINAL ADULT;  Surgeon: Robyne Askew, MD;  Location: [facility] OR;  Service: General;  Laterality: Right;  . INSERTION OF MESH  10/11/2012   Procedure: INSERTION OF MESH;   Surgeon: Robyne Askew, MD;  Location: [facility] OR;  Service: General;  Laterality: Right;  . TONSILLECTOMY  1963   No family history on file. Social History   Social History  . Marital status: Married    Spouse name: N/A  . Number of children: N/A  . Years of education: N/A   Social History Main Topics  . Smoking status: Never Smoker  . Smokeless tobacco: Never Used  . Alcohol use 0.0 oz/week     Comment: rare  . Drug use: No  . Sexual activity: Yes    Birth control/ protection: None   Other Topics Concern  . None   Social History Narrative  . None   Depression screen [facility] 2/9 12/10/2016 12/01/2016 10/06/2016 07/26/2016 05/21/2016  Decreased Interest 0 0 0 0 0  Down, Depressed, Hopeless 0 0 0 0 0  PHQ - 2 Score 0 0 0 0 0    Review of Systems  Constitutional: Positive for fatigue. Negative for activity change, appetite change, chills, fever and unexpected weight change.  HENT: Positive for congestion. Negative for rhinorrhea, trouble swallowing and voice change.   Musculoskeletal: Positive for arthralgias, back pain, gait problem and myalgias. Negative for joint swelling.  Skin: Positive for wound (L great toenail).  Neurological: Negative for weakness and numbness.  Hematological: Negative for adenopathy. Does not bruise/bleed easily.  Psychiatric/Behavioral: Negative for dysphoric mood and sleep disturbance. The patient is not nervous/anxious.       Objective:   Physical Exam  Constitutional: He is oriented to person, place, and time. He appears well-developed and well-nourished. No distress.  HENT:  Head: Normocephalic and atraumatic.  Eyes: Conjunctivae and EOM are normal.  Neck: Neck supple. No tracheal deviation present.  Cardiovascular: Normal rate.   Pulmonary/Chest: Effort normal. No respiratory distress.  Musculoskeletal: Normal range of motion.  Neurological: He is alert and oriented to person, place, and time.  Skin: Skin is warm and dry. No bruising, no  ecchymosis and no rash noted. He is not diaphoretic.  Left great toe with nail removed 1 wk prior. Dried xeroform adhered to nailbed. No erythema, edema, effusion, induration, or drainage. Pain localized to distal medial aspect which led to removal of 3 1x2 mm pieces of ingrown toenail which had been embedded in skin and retained. Pt felt immed relief, no other FB found around nail folds and matrix area.  Psychiatric: He has a normal mood and affect. His behavior is normal.  Nursing note and vitals reviewed.  BP 134/80   Pulse (!) 56   Temp 97.5 F (36.4 C) (Oral)   Resp 16   Ht 5\' 7"  (  1.702 m)   Wt 251 lb 6.4 oz (114 kg)   SpO2 95%   BMI 39.37 kg/m     Assessment & Plan:   1. Chronic pain syndrome   2. Ingrown toenail - Pt with pain at the distal medial aspect - explored with forceps and found 2-3 1-2 mm retained ingrown nail pieces. Nail bed otherwise healthy and healing well. Xeroform in place - trimmed dried extra. Advised no longer needs wound care at this point. RTC immed for any increased pain, redness, swelling, or purulent drainage.   !!  ERROR On reviewing chart after patient's visit - It appears that I accidentally refilled his hydrocodone prescription for May 1 (the last of the 3 below) for #180 tabs rather than #150.  Going forward patient should only be prescribed #150 per month. Discussed with patient my concern that he always feels his prescriptions as early as possible and at least once every 3 months has an injury or an excuse to get an early fill. Repeat UDS last week was appropriate and patient is aware that we will be monitoring this and that if any violations occur I will no longer be able to prescribe him controlled substances.  Discussed that he has much lower threshold for pain and much more pain then we would normally expect with any given situation such as this toenail removal or often the injuries he presents with.  He will often require increased hydrocodone for  an abrasion or contusion after an injury for example.  He can use a small amount of additional acetaminophen but not much and I have repeatedly advised patient to abstain from NSAIDs do to his renal function that has a h/o occasional exacerbations of decreased GFR. Concerned that the chronic pain medication has regulated his receptors and now he has greater tolerance to perceive pain. The treatment for this would be to decrease his dependence on daily narcotics which she does not feel he would be able to do at this time. He is very concerned that any decrease in his pain medication would stop him from working. My concern is that even with 50 mg of hydrocodone daily, which is always the MINIMUM he takes, he is still reporting that he is in a significant amount of pain daily ranging from a 4 to an 8. Because I have failed to control his symptoms he really may be better served by a pain management clinic that may be able to try different regimens or long-acting medications. I will continue to discuss with patient at follow-up. Needs office visit for any controlled medication refills or requests.  Meds ordered this encounter  Medications  . HYDROcodone-acetaminophen (NORCO) 10-325 MG tablet    Sig: Take 1-2 tablets by mouth every 4 (four) hours as needed.    Dispense:  180 tablet    Refill:  0    May fill on Feb 28, 2017.  Marland Kitchen HYDROcodone-acetaminophen (NORCO) 10-325 MG tablet    Sig: Take 1 tablet by mouth every 4 (four) hours as needed.    Dispense:  150 tablet    Refill:  0    May fill on January 31, 2017.  Marland Kitchen HYDROcodone-acetaminophen (NORCO) 10-325 MG tablet    Sig: Take 1 tablet by mouth every 4 (four) hours as needed for moderate pain.    Dispense:  150 tablet    Refill:  0    May fill on 01/02/2017   Today I have utilized the  Controlled Substance Registry's online query  to confirm compliance regarding the patient's narcotic pain medications. My review reveals appropriate prescription fills and  that [facility] is the sole provider of these medications. Rechecks will occur regularly and the patient is aware of our use of the system.  Over 25 min spent in face-to-face evaluation of and consultation with patient and coordination of care.  Over 50% of this time was spent counseling this patient.  I personally performed the services described in this documentation, which was scribed in my presence. The recorded information has been reviewed and considered, and addended by me as needed.   Troy Obrien, M.D.  Primary Care at [facility] [address] [place], [place] [address] [phone] phone [phone] fax  12/16/16 2:24 PM

## 2016-12-10 NOTE — Patient Instructions (Addendum)
Consider using cymbalta, gabapentin, or pregabalin to help with your chronic pain.    IF you received an x-ray today, you will receive an invoice from [facility]. Please contact [facility] at [phone] with questions or concerns regarding your invoice.   IF you received labwork today, you will receive an invoice from [facility]. Please contact [facility] at [phone] with questions or concerns regarding your invoice.   Our billing staff will not be able to assist you with questions regarding bills from these companies.  You will be contacted with the lab results as soon as they are available. The fastest way to get your results is to activate your My Chart account. Instructions are located on the last page of this paperwork. If you have not heard from [place] regarding the results in 2 weeks, please contact this office.     Fingernail or Toenail Removal, Adult, Care After This sheet gives you information about how to care for yourself after your procedure. Your health care provider may also give you more specific instructions. If you have problems or questions, contact your health care provider. What can I expect after the procedure? After the procedure, it is common to have:  Pain.  Redness.  Swelling.  Soreness. Follow these instructions at home:  If you have a splint:  Do not put pressure on any part of the splint until it is fully hardened. This may take several hours.  Wear the splint as told by your health care provider. Remove it only as told by your health care provider.  Loosen the splint if your fingers or toes tingle, become numb, or turn cold and blue.  Keep the splint clean.  If the splint is not waterproof:  Do not let it get wet.  Cover it with a watertight covering when you take a bath or a shower. Wound care  Follow instructions from your health care provider about how to take care of your wound. Make sure you:  Wash your hands with  soap and water before you change your bandage (dressing). If soap and water are not available, use hand sanitizer.  Change your dressing as told by your health care provider.  Keep your dressing dry until your health care provider says it can be removed.  Leave stitches (sutures), skin glue, or adhesive strips in place. These skin closures may need to stay in place for 2 weeks or longer. If adhesive strip edges start to loosen and curl up, you may trim the loose edges. Do not remove adhesive strips completely unless your health care provider tells you to do that.  Check your wound every day for signs of infection. Check for:  More redness, swelling, or pain.  More fluid or blood.  Warmth.  Pus or a bad smell. Managing pain, stiffness, and swelling  Move your fingers or toes often to avoid stiffness and to lessen swelling.  Raise (elevate) the injured area above the level of your heart while you are sitting or lying down. You may need to keep your finger or toe raised or supported on a pillow for 24 hours or as told by your health care provider.  Soak your hand or foot in warm, soapy water for 10-20 minutes, 3 times a day or as told by your health care provider. Medicine  Take over-the-counter and prescription medicines only as told by your health care provider.  If you were prescribed an antibiotic medicine, use it as told by your health care provider. Do not  stop using the antibiotic even if your condition improves. General instructions  If you were given a shoe to wear, wear it as told by your health care provider.  Keep all follow-up visits as told by your health care provider. This is important. Contact a health care provider if:  You have more redness, swelling, or pain around your wound.  You have more fluid or blood coming from your wound.  Your wound feels warm to the touch.  You have pus or a bad smell coming from your wound.  You have a fever.  Your finger or  toe looks blue or black. This information is not intended to replace advice given to you by your health care provider. Make sure you discuss any questions you have with your health care provider. Document Released: 11/07/2014 Document Revised: 06/15/2016 Document Reviewed: 04/25/2016 Elsevier Interactive Patient Education  2017 [employer]

## 2016-12-15 ENCOUNTER — Telehealth: Payer: Self-pay

## 2016-12-15 NOTE — Telephone Encounter (Signed)
Patient would like to get a rx from Dr Clelia CroftShaw for the pain he is having, he stated that they talked about it in his last OV.   His call back number is [phone]

## 2016-12-16 NOTE — Telephone Encounter (Signed)
No - his toenail was removed 2 wks ago so this is well outside of the range of sxs from this, if it is hurting, rec ice and elevation. RTC immed for any redness, swelling, or purulent drainage. If pain increases, we can refer him to podiatry to see what might be causing it.

## 2016-12-16 NOTE — Telephone Encounter (Signed)
I believe he is in a pain contract with you? Please advise

## 2016-12-17 NOTE — Telephone Encounter (Signed)
Pt advised and then states its for his back, I advised no additional pain meds would be given, needs to stay on current plan of care/meds

## 2017-01-12 ENCOUNTER — Other Ambulatory Visit: Payer: Self-pay | Admitting: Family Medicine

## 2017-01-21 NOTE — Progress Notes (Signed)
Subjective:    Patient ID: Troy Obrien, male    DOB: 1951-06-12, 66 y.o.   MRN: [id number] Chief Complaint  Patient presents with  . Sinusitis    had more than 2 weeks; runny nose and constant head pressure; states just doesnt feel good.    HPI  Troy Obrien is a 66 yo male who is here with complaints of sxs consistent w/ a sinus infection.  I saw his wife 3d ago with a sinus infection for which she is currently on amoxicillin (as she also had a concomitant GBS UTI.)  Troy Obrien has severe chronic rhinitis and recurrent chronic sinusitis. His nares/turbanates are always completely occluded with clear rhinitis leaving him needing to mouth breath at his baseline.  He has had turbinate reduction surgery without any benefit and has been told by ENT that they have nothing else to offer him as well as had a second ENT opinion with same result.  He is on claritin chronically.  Had pt on azelastine-fluticasone nasal spray but he did not have any improvement with this so stopped sev mos ago.  Last abx was a course of Keflex 2 mos prior when his toenail was removed. Has not been on amoxicillin in Epic in over a year.  Has been using 8 tabs/d hydrocodone x 22d, should be on 6 tabs/d.  Depression screen [facility] 2/9 01/23/2017 12/10/2016 12/01/2016 10/06/2016 07/26/2016  Decreased Interest 0 0 0 0 0  Down, Depressed, Hopeless 0 0 0 0 0  PHQ - 2 Score 0 0 0 0 0   Past Medical History:  Diagnosis Date  . Arthritis   . Glaucoma   . Gout   . Headache(784.0)   . Hyperlipidemia   . Hypertension   . Inguinal hernia   . Rickets, fetal   . Vitamin D deficiency    Past Surgical History:  Procedure Laterality Date  . carpel tunnel     bilateral  . CIRCUMCISION  1972  . INGUINAL HERNIA REPAIR  10/11/2012   Procedure: HERNIA REPAIR INGUINAL ADULT;  Surgeon: Robyne Askew, MD;  Location: [facility] OR;  Service: General;  Laterality: Right;  . INSERTION OF MESH  10/11/2012   Procedure: INSERTION OF MESH;  Surgeon: Robyne Askew, MD;  Location[facility] OR;  Service: General;  Laterality: Right;  . TONSILLECTOMY  1963   Current Outpatient Prescriptions on File Prior to Visit  Medication Sig Dispense Refill  . amLODipine (NORVASC) 5 MG tablet TAKE 1 TABLET BY MOUTH EVERY DAY 90 tablet 1  . aspirin 81 MG tablet Take 81 mg by mouth daily.    . bimatoprost (LUMIGAN) 0.01 % SOLN Place 1 drop into both eyes at bedtime.    Marland Kitchen doxazosin (CARDURA) 4 MG tablet TAKE 1/2 TABLET BY MOUTH EVERY MORNING AND 1 TABLET DAILY AT BEDTIME 135 tablet 2  . HYDROcodone-acetaminophen (NORCO) 10-325 MG tablet Take 1-2 tablets by mouth every 4 (four) hours as needed. 180 tablet 0  . Loratadine (CLARITIN PO) Take by mouth.    . pravastatin (PRAVACHOL) 20 MG tablet TAKE 1 TABLET (20 MG TOTAL) BY MOUTH AT BEDTIME. 90 tablet 3  . sildenafil (VIAGRA) 100 MG tablet Take 1/4 to 1/2 tablet po qd as needed for erectile dysfunction  30 min to 4 hr prior to intercourse 5 tablet 11  . Azelastine-Fluticasone 137-50 MCG/ACT SUSP Place 1 spray into the nose 2 (two) times daily. (Patient not taking: Reported on 12/10/2016) 1 Bottle 11  No current facility-administered medications on file prior to visit.    Allergies  Allergen Reactions  . Laxative [Bisacodyl] Hives  . Cyclobenzaprine Other (See Comments)    Hand twitching/spasms   No family history on file. Social History   Social History  . Marital status: Married    Spouse name: N/A  . Number of children: N/A  . Years of education: N/A   Social History Main Topics  . Smoking status: Never Smoker  . Smokeless tobacco: Never Used  . Alcohol use 0.0 oz/week     Comment: rare  . Drug use: No  . Sexual activity: Yes    Birth control/ protection: None   Other Topics Concern  . None   Social History Narrative  . None    Review of Systems  Constitutional: Positive for chills, diaphoresis and fatigue. Negative for activity change, appetite change, fever and unexpected weight change.  HENT:  Positive for congestion, postnasal drip, rhinorrhea, sinus pain, sinus pressure, sore throat and voice change. Negative for ear pain and mouth sores.   Respiratory: Positive for cough. Negative for shortness of breath.   Cardiovascular: Negative for chest pain.  Gastrointestinal: Negative for abdominal pain, constipation, diarrhea, nausea and vomiting.  Genitourinary: Negative for dysuria.  Musculoskeletal: Positive for arthralgias, back pain and myalgias.  Skin: Negative for rash.  Neurological: Positive for headaches. Negative for syncope.  Hematological: Negative for adenopathy.  Psychiatric/Behavioral: Positive for sleep disturbance.       Objective:   Physical Exam  Constitutional: He is oriented to person, place, and time. He appears well-developed and well-nourished. No distress.  HENT:  Head: Normocephalic and atraumatic.  Right Ear: External ear and ear canal normal. Tympanic membrane is retracted. A middle ear effusion is present.  Left Ear: External ear and ear canal normal. Tympanic membrane is retracted. A middle ear effusion is present.  Nose: Mucosal edema and rhinorrhea present. Right sinus exhibits maxillary sinus tenderness. Left sinus exhibits maxillary sinus tenderness.  Mouth/Throat: Uvula is midline and mucous membranes are normal. Posterior oropharyngeal erythema present. No oropharyngeal exudate or posterior oropharyngeal edema.  Nares are completely occluded with purulent rhnitis and bright erythema.  Eyes: Conjunctivae are normal. Right eye exhibits no discharge. Left eye exhibits no discharge. No scleral icterus.  Neck: Normal range of motion. Neck supple. No thyromegaly present.  Cardiovascular: Normal rate, regular rhythm, normal heart sounds and intact distal pulses.   Pulmonary/Chest: Effort normal and breath sounds normal. No respiratory distress.  Lymphadenopathy:       Head (right side): Submandibular adenopathy present.       Head (left side):  Submandibular adenopathy present.    He has no cervical adenopathy.       Right: No supraclavicular adenopathy present.       Left: No supraclavicular adenopathy present.  Neurological: He is alert and oriented to person, place, and time.  Skin: Skin is warm and dry. He is not diaphoretic. No erythema.  Psychiatric: He has a normal mood and affect. His behavior is normal.         BP 138/78   Pulse (!) 57   Temp 97.9 F (36.6 C) (Oral)   Resp 18   Ht 5\' 7"  (1.702 m)   Wt 249 lb 12.8 oz (113.3 kg)   SpO2 96%   BMI 39.12 kg/m   Assessment & Plan:  Has 2 additional mos of rxs on his chronic pain medication already. 1. Chronic recurrent sinusitis   Pt states he  has ran out of his pain medication early as he has again been using more frequently than rx'ed due to his sinus pain.  Meds ordered this encounter  Medications  . Fexofenadine HCl (MUCINEX ALLERGY PO)    Sig: Take by mouth.  . predniSONE (DELTASONE) 20 MG tablet    Sig: Take 3 tabs qd x 3d, then 2 tabs qd x 3d then 1 tab qd x 3d.    Dispense:  18 tablet    Refill:  0  . amoxicillin-clavulanate (AUGMENTIN) 875-125 MG tablet    Sig: Take 1 tablet by mouth 2 (two) times daily.    Dispense:  20 tablet    Refill:  0  . HYDROcodone-acetaminophen (NORCO) 10-325 MG tablet    Sig: Take 1 tablet by mouth every 6 (six) hours as needed.    Dispense:  56 tablet    Refill:  0    [name], M.D.  Primary Care at [facility]  [place] [address] [place] [address] [phone] phone [phone] fax  01/25/17 2:05 AM

## 2017-01-23 ENCOUNTER — Ambulatory Visit (INDEPENDENT_AMBULATORY_CARE_PROVIDER_SITE_OTHER): Payer: Managed Care, Other (non HMO) | Admitting: Family Medicine

## 2017-01-23 ENCOUNTER — Encounter: Payer: Self-pay | Admitting: Family Medicine

## 2017-01-23 VITALS — BP 138/78 | HR 57 | Temp 97.9°F | Resp 18 | Ht 67.0 in | Wt 249.8 lb

## 2017-01-23 DIAGNOSIS — J329 Chronic sinusitis, unspecified: Secondary | ICD-10-CM | POA: Diagnosis not present

## 2017-01-23 MED ORDER — PREDNISONE 20 MG PO TABS: ORAL_TABLET | ORAL | 0 refills | Status: DC

## 2017-01-23 MED ORDER — HYDROCODONE-ACETAMINOPHEN 10-325 MG PO TABS: 1.0000 | ORAL_TABLET | Freq: Four times a day (QID) | ORAL | 0 refills | Status: DC | PRN

## 2017-01-23 MED ORDER — AMOXICILLIN-POT CLAVULANATE 875-125 MG PO TABS: 1.0000 | ORAL_TABLET | Freq: Two times a day (BID) | ORAL | 0 refills | Status: DC

## 2017-01-23 NOTE — Patient Instructions (Addendum)
     IF you received an x-ray today, you will receive an invoice from [facility]. Please contact [facility] at [phone] with questions or concerns regarding your invoice.   IF you received labwork today, you will receive an invoice from [facility]. Please contact [facility] at [phone] with questions or concerns regarding your invoice.   Our billing staff will not be able to assist you with questions regarding bills from these companies.  You will be contacted with the lab results as soon as they are available. The fastest way to get your results is to activate your My Chart account. Instructions are located on the last page of this paperwork. If you have not heard from us regarding the results in 2 weeks, please contact this office.      Apnea del sueo (Sleep Apnea) La apnea del sueo es un trastorno que afecta la respiracin. Las Dealerpersonas con apnea del sueo tienen momentos en los que hacen breves pausas al respirar o en los que respiran de forma superficial mientras duermen. La apnea del sueo puede causar estos sntomas:  Dificultad para quedarse dormido.  Sueo o cansancio Administratordurante el da.  Irritabilidad.  Ronquidos fuertes.  Dolores de cabeza matutinos.  Dificultad para concentrarse.  Olvido de cosas.  Menos inters sexual.  Somnolencia sin motivo.  Cambios en el estado de nimo.  Cambios en la personalidad.  Depresin.  Muchos despertares nocturnos para orinar.  M.D.C. HoldingsBoca seca.  Dolor de Advertising copywritergarganta. CUIDADOS EN EL HOGAR  Haga los cambios en su rutina que le haya recomendado el mdico.  Consuma una dieta sana y Alesia Bandabien equilibrada.  Tome los medicamentos de venta libre y los recetados solamente como se lo haya indicado el mdico.  Evite el alcohol, los calmantes (sedantes) y los medicamentos opiceos.  Si tiene sobrepeso, tome medidas para bajar de El Ritopeso.  Si le proporcionaron un dispositivo para usar mientras duerme, selo solamente como  se lo haya indicado el mdico.  No consuma ningn producto que contenga tabaco, lo que incluye cigarrillos, tabaco de Theatre managermascar y Administrator, Civil Servicecigarrillos electrnicos. Si necesita ayuda para dejar de fumar, consulte al mdico.  Concurra a todas las visitas de control como se lo haya indicado el mdico. Esto es importante. SOLICITE AYUDA SI:  El dispositivo que recibi para usar mientras duerme es incmodo o parece no funcionar.  Los sntomas no mejoran.  Los sntomas empeoran. SOLICITE AYUDA DE INMEDIATO SI:  Le duele el pecho.  Tiene dificultad para inhalar suficiente aire (falta de aire).  Tiene molestias en la espalda, en los brazos o en el [place]mago.  Presenta dificultad para hablar.  Siente debilidad en un lado del cuerpo.  Se la cae un lado de la cara. Estos sntomas pueden Customer service managerindicar una emergencia. No espere hasta que los sntomas desaparezcan. Solicite atencin mdica de inmediato. Comunquese con el servicio de emergencias de su localidad (911 en los [place]). No conduzca por sus propios medios OfficeMax Incorporatedhasta el hospital. Esta informacin no tiene Theme park managercomo fin reemplazar el consejo del mdico. Asegrese de hacerle al mdico cualquier pregunta que tenga. Document Released: 11/19/2010 Document Revised: 02/08/2016 Document Reviewed: 07/27/2015 Elsevier Interactive Patient Education  2017 [employer]

## 2017-01-26 ENCOUNTER — Telehealth: Payer: Self-pay | Admitting: Neurology

## 2017-01-26 ENCOUNTER — Encounter: Payer: Self-pay | Admitting: Neurology

## 2017-01-26 DIAGNOSIS — K1379 Other lesions of oral mucosa: Secondary | ICD-10-CM

## 2017-01-26 DIAGNOSIS — G4719 Other hypersomnia: Secondary | ICD-10-CM

## 2017-01-26 DIAGNOSIS — F1190 Opioid use, unspecified, uncomplicated: Secondary | ICD-10-CM

## 2017-01-26 DIAGNOSIS — G478 Other sleep disorders: Secondary | ICD-10-CM

## 2017-01-26 NOTE — Telephone Encounter (Signed)
-----   Message from Sherren MochaEva N Shaw, MD sent at 01/23/2017  4:19 PM EDT ----- Regarding: severe fatigue from nasal obstruction Dr. Vickey Hugerohmeier -    You kindly saw this patient for me last year. He is having very severe fatigue (he is starting to fall asleep driving) which I highly suspect is due to his incredibly severe nasal obstruction. ENT already did a debulking surgery in his nares with no improvement and they told him there was nothing else he could do.  You recommended pt had a split night PSG but his insurance denied but would approve a HST which I see you ordered but then there is no record of what happened with that.  Pt never heard from anybody. Would you mind having someone in your office see if pt could go ahead with the HST?  Would you like me to refer him back to you?  He is concerned that there is no point to having the sleep study if CPAP wouldn't work and ENT does not have any potential surgical therapies but I don't think it is safe for him to keep driving with this level of fatigue.  Thanks so much for your help. Carley HammedEva

## 2017-01-26 NOTE — Telephone Encounter (Signed)
Per Dr Eloise LevelsEwa Obrien; can patient schedule a HST without another visit ? He had ENT surgery and may tolerate CPAP now. Insurance last year denied in lab SPLIT but would allow HST. The patient is severely fatigued and EDS, he is on opioid medication and several antihistamin medications, treated  for HTN.- all can contribute to sleepiness. I think he should have in lab testing,  Can we run this again, Please?   CD

## 2017-02-01 NOTE — Telephone Encounter (Signed)
Working with insurance company to get authorization for the sleep study

## 2017-02-08 ENCOUNTER — Other Ambulatory Visit: Payer: Self-pay | Admitting: Family Medicine

## 2017-02-09 ENCOUNTER — Telehealth: Payer: Self-pay | Admitting: Family Medicine

## 2017-02-09 NOTE — Telephone Encounter (Signed)
Please advise correct b/p meds?

## 2017-02-09 NOTE — Telephone Encounter (Signed)
I am not aware of any reason not to go by his med list. He has been on amlodipine  qd since 10/2015 and karen sent in 30d refill of this yesterday. I don't know what medication he is referring to that is twice a day nor why he is stating he's not on amlodipine yet apparently requested a refill. I would suggest calling pt to get more info - sounds like something got lost in translation.

## 2017-02-09 NOTE — Telephone Encounter (Signed)
Pt states he got a call from his pharmacy stating he had a rx for a amlodipine to pick up but he states he only takes one blood pressure medication and takes it twice a day   Please call [phone] to clarify

## 2017-02-11 ENCOUNTER — Ambulatory Visit (INDEPENDENT_AMBULATORY_CARE_PROVIDER_SITE_OTHER): Payer: Managed Care, Other (non HMO) | Admitting: Family Medicine

## 2017-02-11 VITALS — BP 144/83 | HR 88 | Temp 98.6°F | Resp 18 | Ht 67.0 in | Wt 247.0 lb

## 2017-02-11 DIAGNOSIS — J9801 Acute bronchospasm: Secondary | ICD-10-CM

## 2017-02-11 DIAGNOSIS — J343 Hypertrophy of nasal turbinates: Secondary | ICD-10-CM | POA: Diagnosis not present

## 2017-02-11 DIAGNOSIS — I10 Essential (primary) hypertension: Secondary | ICD-10-CM

## 2017-02-11 MED ORDER — IPRATROPIUM BROMIDE 0.03 % NA SOLN: 2.0000 | Freq: Two times a day (BID) | NASAL | 12 refills | Status: DC

## 2017-02-11 MED ORDER — ALBUTEROL SULFATE (2.5 MG/3ML) 0.083% IN NEBU
2.5000 mg | INHALATION_SOLUTION | Freq: Once | RESPIRATORY_TRACT | Status: AC
  Administered 2017-02-11: 2.5 mg via RESPIRATORY_TRACT

## 2017-02-11 NOTE — Patient Instructions (Addendum)
Mucinex  but not mucinex D   IF you received an x-ray today, you will receive an invoice from [facility]. Please contact [facility] at [phone] with questions or concerns regarding your invoice.   IF you received labwork today, you will receive an invoice from [facility]. Please contact [facility] at [phone] with questions or concerns regarding your invoice.   Our billing staff will not be able to assist you with questions regarding bills from these companies.  You will be contacted with the lab results as soon as they are available. The fastest way to get your results is to activate your My Chart account. Instructions are located on the last page of this paperwork. If you have not heard from [place] regarding the results in 2 weeks, please contact this office.      Bronchospasm, Adult Bronchospasm is a tightening of the airways going into the lungs. During an episode, it may be harder to breathe. You may cough, and you may make a whistling sound when you breathe (wheeze). This condition often affects people with asthma. What are the causes? This condition is caused by swelling and irritation in the airways. It can be triggered by:  An infection (common).  Seasonal allergies.  An allergic reaction.  Exercise.  Irritants. These include pollution, cigarette smoke, strong odors, aerosol sprays, and paint fumes.  Weather changes. Winds increase molds and pollens in the air. Cold air may cause swelling.  Stress and emotional upset. What are the signs or symptoms? Symptoms of this condition include:  Wheezing. If the episode was triggered by an allergy, wheezing may start right away or hours later.  Nighttime coughing.  Frequent or severe coughing with a simple cold.  Chest tightness.  Shortness of breath.  Decreased ability to exercise. How is this diagnosed? This condition is usually diagnosed with a review of your medical history and a physical exam.  Tests, such as lung function tests, are sometimes done to look for other conditions. The need for a chest X-ray depends on where the wheezing occurs and whether it is the first time you have wheezed. How is this treated? This condition may be treated with:  Inhaled medicines. These open up the airways and help you breathe. They can be taken with an inhaler or a nebulizer device.  Corticosteroid medicines. These may be given for severe bronchospasm, usually when it is associated with asthma.  Avoiding triggers, such as irritants, infection, or allergies. Follow these instructions at home: Medicines   Take over-the-counter and prescription medicines only as told by your health care provider.  If you need to use an inhaler or nebulizer to take your medicine, ask your health care provider to explain how to use it correctly. If you were given a spacer, always use it with your inhaler. Lifestyle   Reduce the number of triggers in your home. To do this:  Change your heating and air conditioning filter at least once a month.  Limit your use of fireplaces and wood stoves.  Do not smoke. Do not allow smoking in your home.  Avoid using perfumes and fragrances.  Get rid of pests, such as roaches and mice, and their droppings.  Remove any mold from your home.  Keep your house clean and dust free. Use unscented cleaning products.  Replace carpet with wood, tile, or vinyl flooring. Carpet can trap dander and dust.  Use allergy-proof pillows, mattress covers, and box spring covers.  Wash bed sheets and blankets every week in hot  water. Dry them in a dryer.  Use blankets that are made of polyester or cotton.  Wash your hands often.  Do not allow pets in your bedroom.  Avoid breathing in cold air when you exercise. General instructions   Have a plan for seeking medical care. Know when to call your health care provider and local emergency services, and where to get emergency  care.  Stay up to date on your immunizations.  When you have an episode of bronchospasm, stay calm. Try to relax and breathe more slowly.  If you have asthma, make sure you have an asthma action plan.  Keep all follow-up visits as told by your health care provider. This is important. Contact a health care provider if:  You have muscle aches.  You have chest pain.  The mucus that you cough up (sputum) changes from clear or white to yellow, green, gray, or bloody.  You have a fever.  Your sputum gets thicker. Get help right away if:  Your wheezing and coughing get worse, even after you take your prescribed medicines.  It gets even harder to breathe.  You develop severe chest pain. Summary  Bronchospasm is a tightening of the airways going into the lungs.  During an episode of bronchospasm, you may have a harder time breathing. You may cough and make a whistling sound when you breathe (wheeze).  Avoid exposure to triggers such as smoke, dust, mold, animal dander, and fragrances.  When you have an episode of bronchospasm, stay calm. Try to relax and breathe more slowly. This information is not intended to replace advice given to you by your health care provider. Make sure you discuss any questions you have with your health care provider. Document Released: 10/20/2003 Document Revised: 10/13/2016 Document Reviewed: 10/13/2016 Elsevier Interactive Patient Education  2017 [employer].

## 2017-02-11 NOTE — Progress Notes (Signed)
Chief Complaint  Patient presents with  . Cough    HPI   Pt reports htat after completing his antibiotics for sinusitis and chronic bronchitis his symptoms returned He reports that he was not taking the mucinex because he was not aware of it He reports that he was given 9 days of a prednisone taper and augmentin for 10 days  He reports that he is wheezing He feels like he can't even cough because he is so congested He is a nonsmoker He uses a nasal spray  Azelastine-fluticasone He reports that the moment he stops the medication he goes right back to being congested again He reports that he can feel the mucus in his chest but cannot cough it up.   Past Medical History:  Diagnosis Date  . Arthritis   . Glaucoma   . Gout   . Headache(784.0)   . Hyperlipidemia   . Hypertension   . Inguinal hernia   . Rickets, fetal   . Vitamin D deficiency     Current Outpatient Prescriptions  Medication Sig Dispense Refill  . amLODipine (NORVASC) 5 MG tablet TAKE 1 TABLET BY MOUTH EVERY DAY 30 tablet 0  . aspirin 81 MG tablet Take 81 mg by mouth daily.    . bimatoprost (LUMIGAN) 0.01 % SOLN Place 1 drop into both eyes at bedtime.    Marland Kitchen doxazosin (CARDURA) 4 MG tablet TAKE 1/2 TABLET BY MOUTH EVERY MORNING AND 1 TABLET DAILY AT BEDTIME 135 tablet 2  . HYDROcodone-acetaminophen (NORCO) 10-325 MG tablet Take 1 tablet by mouth every 6 (six) hours as needed. 56 tablet 0  . Loratadine (CLARITIN PO) Take by mouth.    . pravastatin (PRAVACHOL) 20 MG tablet TAKE 1 TABLET (20 MG TOTAL) BY MOUTH AT BEDTIME. 90 tablet 3  . Fexofenadine HCl (MUCINEX ALLERGY PO) Take by mouth.    Marland Kitchen HYDROcodone-acetaminophen (NORCO) 10-325 MG tablet Take 1-2 tablets by mouth every 4 (four) hours as needed. 180 tablet 0  . ipratropium (ATROVENT) 0.03 % nasal spray Place 2 sprays into both nostrils every 12 (twelve) hours. 30 mL 12  . sildenafil (VIAGRA) 100 MG tablet Take 1/4 to 1/2 tablet po qd as needed for erectile  dysfunction  30 min to 4 hr prior to intercourse (Patient not taking: Reported on 02/11/2017) 5 tablet 11   No current facility-administered medications for this visit.     Allergies:  Allergies  Allergen Reactions  . Laxative [Bisacodyl] Hives  . Cyclobenzaprine Other (See Comments)    Hand twitching/spasms    Past Surgical History:  Procedure Laterality Date  . carpel tunnel     bilateral  . CIRCUMCISION  1972  . INGUINAL HERNIA REPAIR  10/11/2012   Procedure: HERNIA REPAIR INGUINAL ADULT;  Surgeon: Robyne Askew, MD;  Location: [facility] OR;  Service: General;  Laterality: Right;  . INSERTION OF MESH  10/11/2012   Procedure: INSERTION OF MESH;  Surgeon: Robyne Askew, MD;  Location: [facility] OR;  Service: General;  Laterality: Right;  . TONSILLECTOMY  1963    Social History   Social History  . Marital status: Married    Spouse name: N/A  . Number of children: N/A  . Years of education: N/A   Social History Main Topics  . Smoking status: Never Smoker  . Smokeless tobacco: Never Used  . Alcohol use 0.0 oz/week     Comment: rare  . Drug use: No  . Sexual activity: Yes    Birth  control/ protection: None   Other Topics Concern  . None   Social History Narrative  . None    ROS  Objective: Vitals:   02/11/17 1511  BP: (!) 144/83  Pulse: 88  Resp: 18  Temp: 98.6 F (37 C)  TempSrc: Oral  SpO2: 94%  Weight: 247 lb (112 kg)  Height:  (1.702 m)    Physical Exam General: alert, oriented, in NAD Head: normocephalic, atraumatic, no sinus tenderness Eyes: EOM intact, no scleral icterus or conjunctival injection Ears: TM clear bilaterally Nose: hypertrophy of nasal turbinates leading to obstruction Throat: no pharyngeal exudate or erythema Lymph: no posterior auricular, submental or cervical lymph adenopathy Heart: normal rate, normal sinus rhythm, no murmurs Lungs: poor air movement with scant wheeze, pt taking shallow breaths After Nebulizer Lung: wheezing  throughout with improved air movemenent  Assessment and Plan Troy Obrien was seen today for cough.  Diagnoses and all orders for this visit:  Acute bronchospasm-  -     albuterol (PROVENTIL) (2.5 MG/3ML) 0.083% nebulizer solution 2.5 mg; Take 3 mLs (2.5 mg total) by nebulization once.  Nasal turbinate hypertrophy- recommend discussing maxillofacial surgery/plastic/reconstructive surgery  Pt will follow up in one week and discuss referral to Plastic Surgery for further evaluation   Essential hypertension- advised to avoid dextromorphan in cough meds  Other orders -     ipratropium (ATROVENT) 0.03 % nasal spray; Place 2 sprays into both nostrils every 12 (twelve) hours.   Since he recent completed prednisone taper and antibiotic augmentin  Offered nebulizer today Discharged home with albuterol hfa with coupon Discussed changes the nasal spray to atrovent spray and how to properly use nasal spray Also discussed that he should take mucinex as instructed by Dr. Clelia Croft  A total of 30 minutes were spent face-to-face with the patient during this encounter and over half of that time was spent on counseling and coordination of care.    Mushka Laconte A Troy Obrien

## 2017-02-13 MED ORDER — DOXAZOSIN MESYLATE 4 MG PO TABS: ORAL_TABLET | ORAL | 0 refills | Status: DC

## 2017-02-13 NOTE — Telephone Encounter (Signed)
Clarified, got amlodipine correct needs refill od doxasosin and is seeing you this week

## 2017-02-16 ENCOUNTER — Ambulatory Visit (INDEPENDENT_AMBULATORY_CARE_PROVIDER_SITE_OTHER): Payer: Managed Care, Other (non HMO) | Admitting: Family Medicine

## 2017-02-16 ENCOUNTER — Encounter: Payer: Self-pay | Admitting: Family Medicine

## 2017-02-16 VITALS — BP 137/90 | HR 99 | Temp 97.9°F | Ht 66.73 in | Wt 249.0 lb

## 2017-02-16 DIAGNOSIS — Z5181 Encounter for therapeutic drug level monitoring: Secondary | ICD-10-CM | POA: Diagnosis not present

## 2017-02-16 DIAGNOSIS — J189 Pneumonia, unspecified organism: Secondary | ICD-10-CM | POA: Diagnosis not present

## 2017-02-16 DIAGNOSIS — R0602 Shortness of breath: Secondary | ICD-10-CM

## 2017-02-16 DIAGNOSIS — M79645 Pain in left finger(s): Secondary | ICD-10-CM | POA: Diagnosis not present

## 2017-02-16 LAB — POCT CBC
GRANULOCYTE PERCENT: 55.5 % (ref 37–80)
HCT, POC: 39.7 % — AB (ref 43.5–53.7)
Hemoglobin: 12.9 g/dL — AB (ref 14.1–18.1)
Lymph, poc: 3.1 (ref 0.6–3.4)
MCH: 26.3 pg — AB (ref 27–31.2)
MCHC: 32.5 g/dL (ref 31.8–35.4)
MCV: 80.9 fL (ref 80–97)
MID (cbc): 0.5 (ref 0–0.9)
MPV: 7.2 fL (ref 0–99.8)
PLATELET COUNT, POC: 320 10*3/uL (ref 142–424)
POC Granulocyte: 4.6 (ref 2–6.9)
POC LYMPH %: 37.8 % (ref 10–50)
POC MID %: 6.7 %M (ref 0–12)
RBC: 4.9 M/uL (ref 4.69–6.13)
RDW, POC: 14.2 %
WBC: 8.2 10*3/uL (ref 4.6–10.2)

## 2017-02-16 MED ORDER — HYDROCODONE-ACETAMINOPHEN 10-325 MG PO TABS: 1.0000 | ORAL_TABLET | Freq: Four times a day (QID) | ORAL | 0 refills | Status: DC | PRN

## 2017-02-16 MED ORDER — HYDROCOD POLST-CPM POLST ER 10-8 MG/5ML PO SUER: 5.0000 mL | Freq: Two times a day (BID) | ORAL | 0 refills | Status: DC | PRN

## 2017-02-16 MED ORDER — ALBUTEROL SULFATE (2.5 MG/3ML) 0.083% IN NEBU: 2.5000 mg | INHALATION_SOLUTION | Freq: Four times a day (QID) | RESPIRATORY_TRACT | 1 refills | Status: DC | PRN

## 2017-02-16 MED ORDER — PREDNISONE 50 MG PO TABS: 50.0000 mg | ORAL_TABLET | Freq: Every day | ORAL | 0 refills | Status: DC

## 2017-02-16 MED ORDER — LEVOFLOXACIN 750 MG PO TABS: 750.0000 mg | ORAL_TABLET | Freq: Every day | ORAL | 0 refills | Status: DC

## 2017-02-16 MED ORDER — IPRATROPIUM BROMIDE 0.02 % IN SOLN
0.5000 mg | Freq: Once | RESPIRATORY_TRACT | Status: AC
  Administered 2017-02-16: 0.5 mg via RESPIRATORY_TRACT

## 2017-02-16 MED ORDER — ALBUTEROL SULFATE (2.5 MG/3ML) 0.083% IN NEBU
2.5000 mg | INHALATION_SOLUTION | Freq: Once | RESPIRATORY_TRACT | Status: AC
  Administered 2017-02-16: 2.5 mg via RESPIRATORY_TRACT

## 2017-02-16 NOTE — Patient Instructions (Addendum)
Use the nebulizer machine every 4 hours while you are awake.    IF you received an x-ray today, you will receive an invoice from [facility]. Please contact [facility] at [phone] with questions or concerns regarding your invoice.   IF you received labwork today, you will receive an invoice from [facility]. Please contact [facility] at [phone] with questions or concerns regarding your invoice.   Our billing staff will not be able to assist you with questions regarding bills from these companies.  You will be contacted with the lab results as soon as they are available. The fastest way to get your results is to activate your My Chart account. Instructions are located on the last page of this paperwork. If you have not heard from [place] regarding the results in 2 weeks, please contact this office.     Community-Acquired Pneumonia, Adult Pneumonia is an infection of the lungs. There are different types of pneumonia. One type can develop while a person is in a hospital. A different type, called community-acquired pneumonia, develops in people who are not, or have not recently been, in the hospital or other health care facility. What are the causes? Pneumonia may be caused by bacteria, viruses, or funguses. Community-acquired pneumonia is often caused by Streptococcus pneumonia bacteria. These bacteria are often passed from one person to another by breathing in droplets from the cough or sneeze of an infected person. What increases the risk? The condition is more likely to develop in:  People who havechronic diseases, such as chronic obstructive pulmonary disease (COPD), asthma, congestive heart failure, cystic fibrosis, diabetes, or kidney disease.  People who haveearly-stage or late-stage HIV.  People who havesickle cell disease.  People who havehad their spleen removed (splenectomy).  People who havepoor Administrator.  People who havemedical conditions that  increase the risk of breathing in (aspirating) secretions their own mouth and nose.  People who havea weakened immune system (immunocompromised).  People who smoke.  People whotravel to areas where pneumonia-causing germs commonly exist.  People whoare around animal habitats or animals that have pneumonia-causing germs, including birds, bats, rabbits, cats, and farm animals. What are the signs or symptoms? Symptoms of this condition include:  Adry cough.  A wet (productive) cough.  Fever.  Sweating.  Chest pain, especially when breathing deeply or coughing.  Rapid breathing or difficulty breathing.  Shortness of breath.  Shaking chills.  Fatigue.  Muscle aches. How is this diagnosed? Your health care provider will take a medical history and perform a physical exam. You may also have other tests, including:  Imaging studies of your chest, including X-rays.  Tests to check your blood oxygen level and other blood gases.  Other tests on blood, mucus (sputum), fluid around your lungs (pleural fluid), and urine. If your pneumonia is severe, other tests may be done to identify the specific cause of your illness. How is this treated? The type of treatment that you receive depends on many factors, such as the cause of your pneumonia, the medicines you take, and other medical conditions that you have. For most adults, treatment and recovery from pneumonia may occur at home. In some cases, treatment must happen in a hospital. Treatment may include:  Antibiotic medicines, if the pneumonia was caused by bacteria.  Antiviral medicines, if the pneumonia was caused by a virus.  Medicines that are given by mouth or through an IV tube.  Oxygen.  Respiratory therapy. Although rare, treating severe pneumonia may include:  Mechanical ventilation. This  is done if you are not breathing well on your own and you cannot maintain a safe blood oxygen level.  Thoracentesis. This  procedureremoves fluid around one lung or both lungs to help you breathe better. Follow these instructions at home:  Take over-the-counter and prescription medicines only as told by your health care provider.  Only takecough medicine if you are losing sleep. Understand that cough medicine can prevent your body's natural ability to remove mucus from your lungs.  If you were prescribed an antibiotic medicine, take it as told by your health care provider. Do not stop taking the antibiotic even if you start to feel better.  Sleep in a semi-upright position at night. Try sleeping in a reclining chair, or place a few pillows under your head.  Do not use tobacco products, including cigarettes, chewing tobacco, and e-cigarettes. If you need help quitting, ask your health care provider.  Drink enough water to keep your urine clear or pale yellow. This will help to thin out mucus secretions in your lungs. How is this prevented? There are ways that you can decrease your risk of developing community-acquired pneumonia. Consider getting a pneumococcal vaccine if:  You are older than 64 years of age.  You are older than 64 years of age and are undergoing cancer treatment, have chronic lung disease, or have other medical conditions that affect your immune system. Ask your health care provider if this applies to you. There are different types and schedules of pneumococcal vaccines. Ask your health care provider which vaccination option is best for you. You may also prevent community-acquired pneumonia if you take these actions:  Get an influenza vaccine every year. Ask your health care provider which type of influenza vaccine is best for you.  Go to the dentist on a regular basis.  Wash your hands often. Use hand sanitizer if soap and water are not available. Contact a health care provider if:  You have a fever.  You are losing sleep because you cannot control your cough with cough medicine. Get  help right away if:  You have worsening shortness of breath.  You have increased chest pain.  Your sickness becomes worse, especially if you are an older adult or have a weakened immune system.  You cough up blood. This information is not intended to replace advice given to you by your health care provider. Make sure you discuss any questions you have with your health care provider. Document Released: 10/17/2005 Document Revised: 02/25/2016 Document Reviewed: 02/11/2015 Elsevier Interactive Patient Education  2017 [employer].

## 2017-02-16 NOTE — Progress Notes (Signed)
Subjective:    Patient ID: Troy Obrien, male    DOB: Sep 29, 1951, 66 y.o.   MRN: [id number] Chief Complaint  Patient presents with  . Follow-up  . Shortness of Breath    headach,fatigue,congestion  . Cough    X 1 mth    HPI Has stopped coughing since wearing oxygen.  No sig swelling in legs. PND is normal.   Past Medical History:  Diagnosis Date  . Arthritis   . Glaucoma   . Gout   . Headache(784.0)   . Hyperlipidemia   . Hypertension   . Inguinal hernia   . Rickets, fetal   . Vitamin D deficiency    Past Surgical History:  Procedure Laterality Date  . carpel tunnel     bilateral  . CIRCUMCISION  1972  . INGUINAL HERNIA REPAIR  10/11/2012   Procedure: HERNIA REPAIR INGUINAL ADULT;  Surgeon: Robyne Askew, MD;  [facility] OR;  Service: General;  Laterality: Right;  . INSERTION OF MESH  10/11/2012   Procedure: INSERTION OF MESH;  Surgeon: Robyne Askew, MD;  Location: [facility] OR;  Service: General;  Laterality: Right;  . TONSILLECTOMY  1963   Current Outpatient Prescriptions on File Prior to Visit  Medication Sig Dispense Refill  . amLODipine (NORVASC) 5 MG tablet TAKE 1 TABLET BY MOUTH EVERY DAY 30 tablet 0  . aspirin 81 MG tablet Take 81 mg by mouth daily.    . bimatoprost (LUMIGAN) 0.01 % SOLN Place 1 drop into both eyes at bedtime.    Marland Kitchen doxazosin (CARDURA) 4 MG tablet TAKE 1/2 TABLET BY MOUTH EVERY MORNING AND 1 TABLET DAILY AT BEDTIME 135 tablet 0  . Fexofenadine HCl (MUCINEX ALLERGY PO) Take by mouth.    Marland Kitchen HYDROcodone-acetaminophen (NORCO) 10-325 MG tablet Take 1-2 tablets by mouth every 4 (four) hours as needed. 180 tablet 0  . HYDROcodone-acetaminophen (NORCO) 10-325 MG tablet Take 1 tablet by mouth every 6 (six) hours as needed. 56 tablet 0  . ipratropium (ATROVENT) 0.03 % nasal spray Place 2 sprays into both nostrils every 12 (twelve) hours. 30 mL 12  . Loratadine (CLARITIN PO) Take by mouth.    . pravastatin (PRAVACHOL) 20 MG tablet TAKE 1 TABLET (20  MG TOTAL) BY MOUTH AT BEDTIME. 90 tablet 3  . sildenafil (VIAGRA) 100 MG tablet Take 1/4 to 1/2 tablet po qd as needed for erectile dysfunction  30 min to 4 hr prior to intercourse (Patient not taking: Reported on 02/11/2017) 5 tablet 11   No current facility-administered medications on file prior to visit.    Allergies  Allergen Reactions  . Laxative [Bisacodyl] Hives  . Cyclobenzaprine Other (See Comments)    Hand twitching/spasms   History reviewed. No pertinent family history. Social History   Social History  . Marital status: Married    Spouse name: N/A  . Number of children: N/A  . Years of education: N/A   Social History Main Topics  . Smoking status: Never Smoker  . Smokeless tobacco: Never Used  . Alcohol use 0.0 oz/week     Comment: rare  . Drug use: No  . Sexual activity: Yes    Birth control/ protection: None   Other Topics Concern  . None   Social History Narrative  . None   Depression screen [facility] 2/9 02/16/2017 02/11/2017 01/23/2017 12/10/2016 12/01/2016  Decreased Interest 0 0 0 0 0  Down, Depressed, Hopeless 0 0 0 0 0  PHQ - 2  Score 0 0 0 0 0    Review of Systems  Constitutional: Positive for appetite change, chills, diaphoresis and fatigue. Negative for activity change, fever and unexpected weight change.  HENT: Positive for congestion, postnasal drip, rhinorrhea, sinus pain, sinus pressure, sore throat and voice change. Negative for ear pain and mouth sores.   Respiratory: Positive for cough. Negative for shortness of breath.   Cardiovascular: Negative for chest pain.  Gastrointestinal: Negative for abdominal pain, constipation, diarrhea, nausea and vomiting.  Genitourinary: Negative for dysuria.  Musculoskeletal: Positive for arthralgias, back pain and myalgias.  Skin: Negative for rash.  Neurological: Positive for headaches. Negative for syncope.  Hematological: Negative for adenopathy.  Psychiatric/Behavioral: Positive for sleep disturbance.   See  hpi    Objective:   Physical Exam  Constitutional: He is oriented to person, place, and time. He appears well-developed and well-nourished. No distress.  HENT:  Head: Normocephalic and atraumatic.  Right Ear: External ear and ear canal normal. Tympanic membrane is retracted. A middle ear effusion is present.  Left Ear: External ear and ear canal normal. Tympanic membrane is retracted. A middle ear effusion is present.  Nose: Mucosal edema and rhinorrhea present. Right sinus exhibits maxillary sinus tenderness. Left sinus exhibits maxillary sinus tenderness.  Mouth/Throat: Uvula is midline and mucous membranes are normal. Posterior oropharyngeal erythema present. No oropharyngeal exudate or posterior oropharyngeal edema.  Eyes: Conjunctivae are normal. Right eye exhibits no discharge. Left eye exhibits no discharge. No scleral icterus.  Neck: Normal range of motion. Neck supple. No thyromegaly present.  Cardiovascular: Normal rate, regular rhythm, normal heart sounds and intact distal pulses.   Pulmonary/Chest: Effort normal and breath sounds normal. No respiratory distress.  Lymphadenopathy:       Head (right side): Submandibular adenopathy present.       Head (left side): Submandibular adenopathy present.    He has no cervical adenopathy.       Right: No supraclavicular adenopathy present.       Left: No supraclavicular adenopathy present.  Neurological: He is alert and oriented to person, place, and time.  Skin: Skin is warm and dry. He is not diaphoretic. No erythema.  Psychiatric: He has a normal mood and affect. His behavior is normal.      BP 137/90 (BP Location: Right Arm, Patient Position: Lying left side, Cuff Size: Large)   Pulse 99 Comment: with O2  Temp 97.9 F (36.6 C) (Oral)   Ht 5' 6.73" (1.695 m)   Wt 249 lb (112.9 kg)   SpO2 92%   BMI 39.31 kg/m      Assessment & Plan:   1. Shortness of breath   2. Pneumonia of both lungs due to infectious organism,  unspecified part of lung   3. Medication monitoring encounter   4. Finger pain, left     Orders Placed This Encounter  Procedures  . DME Nebulizer machine    Order Specific Question:   Patient needs a nebulizer to treat with the following condition    Answer:   Pneumonia [227785]    Order Specific Question:   Patient needs a nebulizer to treat with the following condition    Answer:   Reactive airway disease [161096]    Order Specific Question:   Patient needs a nebulizer to treat with the following condition    Answer:   Hypoxia [300808]  . Comprehensive metabolic panel  . Uric acid  . POCT CBC    Meds ordered this encounter  Medications  .  albuterol (PROVENTIL) (2.5 MG/3ML) 0.083% nebulizer solution 2.5 mg  . ipratropium (ATROVENT) nebulizer solution 0.5 mg  . albuterol (PROVENTIL) (2.5 MG/3ML) 0.083% nebulizer solution    Sig: Take 3 mLs (2.5 mg total) by nebulization every 6 (six) hours as needed for wheezing or shortness of breath.    Dispense:  150 mL    Refill:  1  . chlorpheniramine-HYDROcodone (TUSSIONEX PENNKINETIC ER) 10-8 MG/5ML SUER    Sig: Take 5 mLs by mouth every 12 (twelve) hours as needed.    Dispense:  140 mL    Refill:  0  . levofloxacin (LEVAQUIN) 750 MG tablet    Sig: Take 1 tablet (750 mg total) by mouth daily.    Dispense:  7 tablet    Refill:  0  . HYDROcodone-acetaminophen (NORCO) 10-325 MG tablet    Sig: Take 1 tablet by mouth every 6 (six) hours as needed for severe pain.    Dispense:  44 tablet    Refill:  0  . predniSONE (DELTASONE) 50 MG tablet    Sig: Take 1 tablet (50 mg total) by mouth daily with breakfast.    Dispense:  5 tablet    Refill:  0     [name], M.D.  Primary Care at [facility] [address] [place], [place] [address] [phone] phone [phone] fax  03/01/17 11:33 PM

## 2017-02-17 LAB — COMPREHENSIVE METABOLIC PANEL
A/G RATIO: 1.1 — AB (ref 1.2–2.2)
ALT: 15 IU/L (ref 0–44)
AST: 18 IU/L (ref 0–40)
Albumin: 4 g/dL (ref 3.6–4.8)
Alkaline Phosphatase: 52 IU/L (ref 39–117)
BILIRUBIN TOTAL: 0.3 mg/dL (ref 0.0–1.2)
BUN/Creatinine Ratio: 12 (ref 10–24)
BUN: 13 mg/dL (ref 8–27)
CHLORIDE: 100 mmol/L (ref 96–106)
CO2: 24 mmol/L (ref 18–29)
Calcium: 9.4 mg/dL (ref 8.6–10.2)
Creatinine, Ser: 1.1 mg/dL (ref 0.76–1.27)
GFR calc non Af Amer: 71 mL/min/{1.73_m2} (ref >59)
GFR, EST AFRICAN AMERICAN: 82 mL/min/{1.73_m2} (ref >59)
Globulin, Total: 3.5 g/dL (ref 1.5–4.5)
Glucose: 89 mg/dL (ref 65–99)
POTASSIUM: 4.5 mmol/L (ref 3.5–5.2)
Sodium: 138 mmol/L (ref 134–144)
Total Protein: 7.5 g/dL (ref 6.0–8.5)

## 2017-02-17 LAB — URIC ACID: Uric Acid: 5.2 mg/dL (ref 3.7–8.6)

## 2017-02-20 ENCOUNTER — Ambulatory Visit (INDEPENDENT_AMBULATORY_CARE_PROVIDER_SITE_OTHER): Payer: Managed Care, Other (non HMO) | Admitting: Family Medicine

## 2017-02-20 ENCOUNTER — Encounter: Payer: Self-pay | Admitting: Family Medicine

## 2017-02-20 VITALS — BP 120/70 | HR 72 | Temp 98.1°F | Resp 18 | Ht 66.73 in | Wt 245.6 lb

## 2017-02-20 DIAGNOSIS — J189 Pneumonia, unspecified organism: Secondary | ICD-10-CM | POA: Diagnosis not present

## 2017-02-20 DIAGNOSIS — B370 Candidal stomatitis: Secondary | ICD-10-CM

## 2017-02-20 MED ORDER — ALBUTEROL SULFATE (2.5 MG/3ML) 0.083% IN NEBU
2.5000 mg | INHALATION_SOLUTION | Freq: Once | RESPIRATORY_TRACT | Status: AC
  Administered 2017-02-20: 2.5 mg via RESPIRATORY_TRACT

## 2017-02-20 MED ORDER — IPRATROPIUM BROMIDE 0.02 % IN SOLN
0.5000 mg | Freq: Once | RESPIRATORY_TRACT | Status: AC
  Administered 2017-02-20: 0.5 mg via RESPIRATORY_TRACT

## 2017-02-20 MED ORDER — CLOTRIMAZOLE 10 MG MT TROC: 10.0000 mg | Freq: Every day | OROMUCOSAL | 0 refills | Status: DC

## 2017-02-20 NOTE — Progress Notes (Deleted)
   Subjective:    Patient ID: Troy Obrien, male    DOB: 24-Jan-1951, 66 y.o.   MRN: [id number] . HPI    Review of Systems     Objective:   Physical Exam        Assessment & Plan:

## 2017-02-20 NOTE — Progress Notes (Signed)
Subjective:    Patient ID: Troy Obrien, male    DOB: [birth year], 66 y.o.   MRN: [id number] Chief Complaint  Patient presents with  . Follow-up    SOB    HPI  I saw Mr. Salls 4 days ago. He had been ill for over a week and was having desaturations. Unfortunately we were unable to obtain a chest x-ray is a radiology tech was gone. We placed him on levofloxacin 750 mg daily for 1 week and prednisone 50 mg a day for 5 days. He was given compression next to use for cough suppression and instructed to [facility]. stay home from work. He was to use an albuterol DuoNeb every 4 hours. He was having diffuse expiratory wheezing and rhonchi which resolved after DuoNeb administration in the office. His oxygen desaturated to 90% with ambulation but was 92-94% at rest. It did not improve substantially when he was administered 2 L of oxygen by nonrebreather in the office.  He had been using more pain medicine prescribed and so was given pain medication at his prescribed regimen to last until he was due for his routine refill.  Past Medical History:  Diagnosis Date  . Arthritis   . Glaucoma   . Gout   . Headache(784.0)   . Hyperlipidemia   . Hypertension   . Inguinal hernia   . Rickets, fetal   . Vitamin D deficiency    Past Surgical History:  Procedure Laterality Date  . carpel tunnel     bilateral  . CIRCUMCISION  1972  . INGUINAL HERNIA REPAIR  10/11/2012   Procedure: HERNIA REPAIR INGUINAL ADULT;  Surgeon: Robyne Askew, MD;  Location: [facility] OR;  Service: General;  Laterality: Right;  . INSERTION OF MESH  10/11/2012   Procedure: INSERTION OF MESH;  Surgeon: Robyne Askew, MD;  Location: [facility] OR;  Service: General;  Laterality: Right;  . TONSILLECTOMY  1963   Current Outpatient Prescriptions on File Prior to Visit  Medication Sig Dispense Refill  . albuterol (PROVENTIL) (2.5 MG/3ML) 0.083% nebulizer solution Take 3 mLs (2.5 mg total) by nebulization every 6 (six) hours as needed for wheezing  or shortness of breath. 150 mL 1  . amLODipine (NORVASC) 5 MG tablet TAKE 1 TABLET BY MOUTH EVERY DAY 30 tablet 0  . aspirin 81 MG tablet Take 81 mg by mouth daily.    . bimatoprost (LUMIGAN) 0.01 % SOLN Place 1 drop into both eyes at bedtime.    . chlorpheniramine-HYDROcodone (TUSSIONEX PENNKINETIC ER) 10-8 MG/5ML SUER Take 5 mLs by mouth every 12 (twelve) hours as needed. 140 mL 0  . doxazosin (CARDURA) 4 MG tablet TAKE 1/2 TABLET BY MOUTH EVERY MORNING AND 1 TABLET DAILY AT BEDTIME 135 tablet 0  . Fexofenadine HCl (MUCINEX ALLERGY PO) Take by mouth.    Marland Kitchen HYDROcodone-acetaminophen (NORCO) 10-325 MG tablet Take 1-2 tablets by mouth every 4 (four) hours as needed. 180 tablet 0  . HYDROcodone-acetaminophen (NORCO) 10-325 MG tablet Take 1 tablet by mouth every 6 (six) hours as needed for severe pain. 44 tablet 0  . ipratropium (ATROVENT) 0.03 % nasal spray Place 2 sprays into both nostrils every 12 (twelve) hours. 30 mL 12  . Loratadine (CLARITIN PO) Take by mouth.    . pravastatin (PRAVACHOL) 20 MG tablet TAKE 1 TABLET (20 MG TOTAL) BY MOUTH AT BEDTIME. 90 tablet 3   No current facility-administered medications on file prior to visit.    Allergies  Allergen  Reactions  . Laxative [Bisacodyl] Hives  . Cyclobenzaprine Other (See Comments)    Hand twitching/spasms   History reviewed. No pertinent family history. Social History   Social History  . Marital status: Married    Spouse name: N/A  . Number of children: N/A  . Years of education: N/A   Social History Main Topics  . Smoking status: Never Smoker  . Smokeless tobacco: Never Used  . Alcohol use 0.0 oz/week     Comment: rare  . Drug use: No  . Sexual activity: Yes    Birth control/ protection: None   Other Topics Concern  . None   Social History Narrative  . None   Depression screen [facility] 2/9 02/20/2017 02/16/2017 02/11/2017 01/23/2017 12/10/2016  Decreased Interest 0 0 0 0 0  Down, Depressed, Hopeless 0 0 0 0 0  PHQ - 2 Score  0 0 0 0 0    Review of Systems See hpi    Objective:   Physical Exam  Constitutional: He is oriented to person, place, and time. He appears well-developed and well-nourished. No distress.  HENT:  Head: Normocephalic and atraumatic.  Right Ear: External ear and ear canal normal. Tympanic membrane is retracted. A middle ear effusion is present.  Left Ear: External ear and ear canal normal. Tympanic membrane is retracted. A middle ear effusion is present.  Nose: Mucosal edema and rhinorrhea present. Right sinus exhibits maxillary sinus tenderness. Left sinus exhibits maxillary sinus tenderness.  Mouth/Throat: Uvula is midline and mucous membranes are normal. Posterior oropharyngeal erythema present. No oropharyngeal exudate or posterior oropharyngeal edema.  Eyes: Conjunctivae are normal. Pupils are equal, round, and reactive to light. Right eye exhibits no discharge. Left eye exhibits no discharge. No scleral icterus.  Neck: Normal range of motion. Neck supple. No thyromegaly present.  Cardiovascular: Normal rate, regular rhythm, normal heart sounds and intact distal pulses.   Pulmonary/Chest: Effort normal and breath sounds normal. No respiratory distress.  Musculoskeletal: He exhibits no edema.  Lymphadenopathy:       Head (right side): Submandibular adenopathy present.       Head (left side): Submandibular adenopathy present.    He has no cervical adenopathy.       Right: No supraclavicular adenopathy present.       Left: No supraclavicular adenopathy present.  Neurological: He is alert and oriented to person, place, and time.  Skin: Skin is warm and dry. He is not diaphoretic. No erythema.  Psychiatric: He has a normal mood and affect. His behavior is normal.      BP 120/70   Pulse 72   Temp 98.1 F (36.7 C) (Oral)   Resp 18   Ht 5' 6.73" (1.695 m)   Wt 245 lb 9.6 oz (111.4 kg)   SpO2 94%   BMI 38.78 kg/m      Assessment & Plan:   1. Community acquired pneumonia,  unspecified laterality   2. Thrush    Improving. Cont nebs.  Meds ordered this encounter  Medications  . albuterol (PROVENTIL) (2.5 MG/3ML) 0.083% nebulizer solution 2.5 mg  . ipratropium (ATROVENT) nebulizer solution 0.5 mg  . clotrimazole (MYCELEX) 10 MG troche    Sig: Take 1 tablet (10 mg total) by mouth 5 (five) times daily. For a week    Dispense:  35 tablet    Refill:  0      [name], M.D.  Primary Care at [facility] [address] [place], [place] [address] [phone]  phone [phone] fax  03/08/17 2:26 AM

## 2017-02-20 NOTE — Patient Instructions (Addendum)
Use the clotrimazole 5 times a day and the albuterol nebulizer 4 times a day for the next week. If at any point you are feeling worse, please come back immediately. If you are not starting to feel back to normal in a week please come back for recheck.      IF you received an x-ray today, you will receive an invoice from [facility]. Please contact [facility] at [phone] with questions or concerns regarding your invoice.   IF you received labwork today, you will receive an invoice from [facility]. Please contact [facility] at [phone] with questions or concerns regarding your invoice.   Our billing staff will not be able to assist you with questions regarding bills from these companies.  You will be contacted with the lab results as soon as they are available. The fastest way to get your results is to activate your My Chart account. Instructions are located on the last page of this paperwork. If you have not heard from [place] regarding the results in 2 weeks, please contact this office.     Community-Acquired Pneumonia, Adult Pneumonia is an infection of the lungs. There are different types of pneumonia. One type can develop while a person is in a hospital. A different type, called community-acquired pneumonia, develops in people who are not, or have not recently been, in the hospital or other health care facility. What are the causes? Pneumonia may be caused by bacteria, viruses, or funguses. Community-acquired pneumonia is often caused by Streptococcus pneumonia bacteria. These bacteria are often passed from one person to another by breathing in droplets from the cough or sneeze of an infected person. What increases the risk? The condition is more likely to develop in:  People who havechronic diseases, such as chronic obstructive pulmonary disease (COPD), asthma, congestive heart failure, cystic fibrosis, diabetes, or kidney disease.  People who haveearly-stage or  late-stage HIV.  People who havesickle cell disease.  People who havehad their spleen removed (splenectomy).  People who havepoor Administrator.  People who havemedical conditions that increase the risk of breathing in (aspirating) secretions their own mouth and nose.  People who havea weakened immune system (immunocompromised).  People who smoke.  People whotravel to areas where pneumonia-causing germs commonly exist.  People whoare around animal habitats or animals that have pneumonia-causing germs, including birds, bats, rabbits, cats, and farm animals. What are the signs or symptoms? Symptoms of this condition include:  Adry cough.  A wet (productive) cough.  Fever.  Sweating.  Chest pain, especially when breathing deeply or coughing.  Rapid breathing or difficulty breathing.  Shortness of breath.  Shaking chills.  Fatigue.  Muscle aches. How is this diagnosed? Your health care provider will take a medical history and perform a physical exam. You may also have other tests, including:  Imaging studies of your chest, including X-rays.  Tests to check your blood oxygen level and other blood gases.  Other tests on blood, mucus (sputum), fluid around your lungs (pleural fluid), and urine. If your pneumonia is severe, other tests may be done to identify the specific cause of your illness. How is this treated? The type of treatment that you receive depends on many factors, such as the cause of your pneumonia, the medicines you take, and other medical conditions that you have. For most adults, treatment and recovery from pneumonia may occur at home. In some cases, treatment must happen in a hospital. Treatment may include:  Antibiotic medicines, if the pneumonia was caused by  bacteria.  Antiviral medicines, if the pneumonia was caused by a virus.  Medicines that are given by mouth or through an IV tube.  Oxygen.  Respiratory therapy. Although rare,  treating severe pneumonia may include:  Mechanical ventilation. This is done if you are not breathing well on your own and you cannot maintain a safe blood oxygen level.  Thoracentesis. This procedureremoves fluid around one lung or both lungs to help you breathe better. Follow these instructions at home:  Take over-the-counter and prescription medicines only as told by your health care provider.  Only takecough medicine if you are losing sleep. Understand that cough medicine can prevent your body's natural ability to remove mucus from your lungs.  If you were prescribed an antibiotic medicine, take it as told by your health care provider. Do not stop taking the antibiotic even if you start to feel better.  Sleep in a semi-upright position at night. Try sleeping in a reclining chair, or place a few pillows under your head.  Do not use tobacco products, including cigarettes, chewing tobacco, and e-cigarettes. If you need help quitting, ask your health care provider.  Drink enough water to keep your urine clear or pale yellow. This will help to thin out mucus secretions in your lungs. How is this prevented? There are ways that you can decrease your risk of developing community-acquired pneumonia. Consider getting a pneumococcal vaccine if:  You are older than 64 years of age.  You are older than 64 years of age and are undergoing cancer treatment, have chronic lung disease, or have other medical conditions that affect your immune system. Ask your health care provider if this applies to you. There are different types and schedules of pneumococcal vaccines. Ask your health care provider which vaccination option is best for you. You may also prevent community-acquired pneumonia if you take these actions:  Get an influenza vaccine every year. Ask your health care provider which type of influenza vaccine is best for you.  Go to the dentist on a regular basis.  Wash your hands often. Use  hand sanitizer if soap and water are not available. Contact a health care provider if:  You have a fever.  You are losing sleep because you cannot control your cough with cough medicine. Get help right away if:  You have worsening shortness of breath.  You have increased chest pain.  Your sickness becomes worse, especially if you are an older adult or have a weakened immune system.  You cough up blood. This information is not intended to replace advice given to you by your health care provider. Make sure you discuss any questions you have with your health care provider. Document Released: 10/17/2005 Document Revised: 02/25/2016 Document Reviewed: 02/11/2015 Elsevier Interactive Patient Education  2017 [employer].

## 2017-03-17 ENCOUNTER — Ambulatory Visit (INDEPENDENT_AMBULATORY_CARE_PROVIDER_SITE_OTHER): Payer: Managed Care, Other (non HMO) | Admitting: Family Medicine

## 2017-03-17 ENCOUNTER — Encounter: Payer: Self-pay | Admitting: Family Medicine

## 2017-03-17 VITALS — BP 138/82 | HR 54 | Temp 98.3°F | Resp 18 | Ht 66.73 in | Wt 253.2 lb

## 2017-03-17 DIAGNOSIS — M545 Low back pain, unspecified: Secondary | ICD-10-CM

## 2017-03-17 DIAGNOSIS — R351 Nocturia: Secondary | ICD-10-CM | POA: Diagnosis not present

## 2017-03-17 DIAGNOSIS — L600 Ingrowing nail: Secondary | ICD-10-CM

## 2017-03-17 DIAGNOSIS — G4710 Hypersomnia, unspecified: Secondary | ICD-10-CM

## 2017-03-17 DIAGNOSIS — R5383 Other fatigue: Secondary | ICD-10-CM

## 2017-03-17 DIAGNOSIS — N401 Enlarged prostate with lower urinary tract symptoms: Secondary | ICD-10-CM | POA: Diagnosis not present

## 2017-03-17 DIAGNOSIS — J343 Hypertrophy of nasal turbinates: Secondary | ICD-10-CM

## 2017-03-17 DIAGNOSIS — G4733 Obstructive sleep apnea (adult) (pediatric): Secondary | ICD-10-CM

## 2017-03-17 DIAGNOSIS — G4730 Sleep apnea, unspecified: Secondary | ICD-10-CM | POA: Diagnosis not present

## 2017-03-17 DIAGNOSIS — G8929 Other chronic pain: Secondary | ICD-10-CM | POA: Diagnosis not present

## 2017-03-17 DIAGNOSIS — I10 Essential (primary) hypertension: Secondary | ICD-10-CM

## 2017-03-17 DIAGNOSIS — G894 Chronic pain syndrome: Secondary | ICD-10-CM | POA: Diagnosis not present

## 2017-03-17 MED ORDER — HYDROCODONE-ACETAMINOPHEN 10-325 MG PO TABS: 1.0000 | ORAL_TABLET | ORAL | 0 refills | Status: DC | PRN

## 2017-03-17 MED ORDER — PRAVASTATIN SODIUM 20 MG PO TABS: ORAL_TABLET | ORAL | 1 refills | Status: DC

## 2017-03-17 MED ORDER — AMLODIPINE BESYLATE 5 MG PO TABS: 5.0000 mg | ORAL_TABLET | Freq: Every day | ORAL | 1 refills | Status: DC

## 2017-03-17 MED ORDER — DOXAZOSIN MESYLATE 4 MG PO TABS: ORAL_TABLET | ORAL | 1 refills | Status: DC

## 2017-03-17 NOTE — Patient Instructions (Addendum)
Dr. Irene LimboSandra Fuller is a dentist in town who specialized in oral/dental devices that can treat sleep apnea.  PLEASE CALL HER.      IF you received an x-ray today, [facility]. Please contact [facility] at [phone] with questions or concerns regarding your invoice.   IF you received labwork today, you will receive an invoice from [facility]. Please contact [facility] at [phone] with questions or concerns regarding your invoice.   Our billing staff will not be able to assist you with questions regarding bills from these companies.  You will be contacted with the lab results as soon as they are available. The fastest way to get your results is to activate your My Chart account. Instructions are located on the last page of this paperwork. If you have not heard from us regarding the results in 2 weeks, please contact this office.

## 2017-03-17 NOTE — Progress Notes (Signed)
By signing my name below, I, Troy Obrien, attest that this documentation has been prepared under the direction and in the presence of Troy Sorenson, MD.  Electronically Signed: Arvilla Obrien, Medical Scribe. 03/17/17. 4:27 PM.  Subjective:    Patient ID: Troy Obrien, male    DOB: 09/26/1951, 66 y.o.   MRN: [id number]  HPI Chief Complaint  Patient presents with  . Ingrown Toenail    right foot second toe x2 days    HPI Comments: Troy Obrien is a 66 y.o. male who presents to Primary Care at [facility] complaining of ingrown toenail located on the right second toe onset 2 days. He last had his left 1st toenail removed 12/01/16. Started on hydrocodone after his mom passed in 02/2012.  Toenail: Around the time he scheduled his appt, his nail was draining so he cut into his toenail, causing it to bleed. He's been putting peroxide on his toe for relief of his sxs. Reports his toe is improving and he wanted to make sure everything was fine. Pt used to have a toenail fungus but it has improved since. Pt's wife suspects his Sunday shoes are causing ingrown toe nails, although he only wears them 1x a week.  Back Pain: Reports his intermittent back pain is mainly in his mid back to lower back; rating it 8/10 at its worse and "okay" while on medications. He reports associated sxs of muscle spasms in his back that shoots from his mid to left back onset a week, and chronic fatigue. Reports nodding off easily and suspects it's due to not getting good sleep for years now. Pt just bought a new bed without relief of his back pain. Pt was taking 5 hydrocodone's and he took about 1-3 extra 3x in the day, but he's never went up from that. Pt reports intolerance to muscle relaxers due to the twitching in his hand causing involuntary releasing of the objects he was holding.  HTN: Pt hasn't been complaint with cardura; takes half a tab in the morning, and 1 whole tab at night. Reports experiencing fatigue,  light-headedness and infrequent dizziness with movement. Pt is hesitant to take more cardue due to urinary frequency, nocturia, and lack of blood flow to "other parts of the body". Pt has not seen a dentist for a mouth guard. BP Readings from Last 3 Encounters:  03/17/17 (!) 148/70  02/20/17 120/70  02/16/17 137/90    Lab Results  Component Value Date   CREATININE 1.10 02/16/2017   Patient Active Problem List   Diagnosis Date Noted  . Hyperlipidemia 05/02/2016  . Hypersomnia with sleep apnea 02/17/2016  . Snoring 02/17/2016  . Nocturia more than twice per night 02/17/2016  . NO (nasal obstruction) 02/17/2016  . Sleep related headaches 02/17/2016  . Morbid obesity due to excess calories (HCC) 02/17/2016  . Vitamin D deficiency 02/04/2015  . Other chronic sinusitis 02/04/2015  . Rhinitis, allergic 02/04/2015  . BPH associated with nocturia 05/10/2014  . Polypharmacy 04/13/2014  . Raynaud phenomenon 04/13/2014  . Erectile dysfunction 04/13/2014  . Type 2 diabetes mellitus (HCC) 04/13/2014  . Thoracic back pain 03/26/2014  . Right groin pain 02/20/2013  . Arthritis of hand 07/08/2012  . Fatigue 07/08/2012  . Sleep apnea 07/08/2012  . Hernia 07/08/2012  . Inguinal hernia, right 03/30/2012  . Right upper quadrant pain 03/30/2012  . Back pain 03/30/2012  . Hypertension 02/02/2012  . Glaucoma 02/02/2012   Past Medical History:  Diagnosis Date  . Arthritis   .  Glaucoma   . Gout   . Headache(784.0)   . Hyperlipidemia   . Hypertension   . Inguinal hernia   . Rickets, fetal   . Vitamin D deficiency    Past Surgical History:  Procedure Laterality Date  . carpel tunnel     bilateral  . CIRCUMCISION  1972  . INGUINAL HERNIA REPAIR  10/11/2012   Procedure: HERNIA REPAIR INGUINAL ADULT;  Surgeon: Robyne Askew, MD;  Location: [facility] OR;  Service: General;  Laterality: Right;  . INSERTION OF MESH  10/11/2012   Procedure: INSERTION OF MESH;  Surgeon: Robyne Askew, MD;   Location: [facility] OR;  Service: General;  Laterality: Right;  . TONSILLECTOMY  1963   Allergies  Allergen Reactions  . Laxative [Bisacodyl] Hives  . Cyclobenzaprine Other (See Comments)    Hand twitching/spasms   Prior to Admission medications   Medication Sig Start Date End Date Taking? Authorizing Provider  albuterol (PROVENTIL) (2.5 MG/3ML) 0.083% nebulizer solution Take 3 mLs (2.5 mg total) by nebulization every 6 (six) hours as needed for wheezing or shortness of breath. 02/16/17   Sherren Mocha, MD  amLODipine (NORVASC) 5 MG tablet TAKE 1 TABLET BY MOUTH EVERY DAY 02/08/17   Sherren Mocha, MD  aspirin 81 MG tablet Take 81 mg by mouth daily.    [provider]  bimatoprost (LUMIGAN) 0.01 % SOLN Place 1 drop into both eyes at bedtime.    [provider]  chlorpheniramine-HYDROcodone (TUSSIONEX PENNKINETIC ER) 10-8 MG/5ML SUER Take 5 mLs by mouth every 12 (twelve) hours as needed. 02/16/17   Sherren Mocha, MD  clotrimazole (MYCELEX) 10 MG troche Take 1 tablet (10 mg total) by mouth 5 (five) times daily. For a week 02/20/17   Sherren Mocha, MD  doxazosin (CARDURA) 4 MG tablet TAKE 1/2 TABLET BY MOUTH EVERY MORNING AND 1 TABLET DAILY AT BEDTIME 02/13/17   Sherren Mocha, MD  Fexofenadine HCl [facility] ALLERGY PO) Take by mouth.    [provider]  HYDROcodone-acetaminophen (NORCO) 10-325 MG tablet Take 1-2 tablets by mouth every 4 (four) hours as needed. 12/10/16   Sherren Mocha, MD  HYDROcodone-acetaminophen [facility]) 10-325 MG tablet Take 1 tablet by mouth every 6 (six) hours as needed for severe pain. 02/16/17   Sherren Mocha, MD  ipratropium (ATROVENT) 0.03 % nasal spray Place 2 sprays into both nostrils every 12 (twelve) hours. 02/11/17   Doristine Bosworth, MD  Loratadine (CLARITIN PO) Take by mouth.    [provider]  pravastatin (PRAVACHOL) 20 MG tablet TAKE 1 TABLET (20 MG TOTAL) BY MOUTH AT BEDTIME. 05/02/16   Sherren Mocha, MD   Social History   Social History  . Marital  status: Married    Spouse name: N/A  . Number of children: N/A  . Years of education: N/A   Occupational History  . Not on file.   Social History Main Topics  . Smoking status: Never Smoker  . Smokeless tobacco: Never Used  . Alcohol use 0.0 oz/week     Comment: rare  . Drug use: No  . Sexual activity: Yes    Birth control/ protection: None   Other Topics Concern  . Not on file   Social History Narrative  . No narrative on file   Review of Systems  Constitutional: Positive for fatigue.  Genitourinary: Positive for frequency.  Musculoskeletal: Positive for back pain.  Skin: Positive for wound (toe). Negative for color  change.  Neurological: Positive for dizziness and headaches. Negative for tremors.  Psychiatric/Behavioral: Positive for sleep disturbance.   Objective:  Physical Exam  Constitutional: He appears well-developed and well-nourished. No distress.  HENT:  Head: Normocephalic and atraumatic.  Eyes: Conjunctivae are normal.  Neck: Neck supple.  Cardiovascular: Normal rate.   Pulmonary/Chest: Effort normal.  Musculoskeletal:  Locates back pain at Mid line and radiates to the left  Neurological: He is alert.  Skin: Skin is warm and dry.  Toenails white, but thin Right 2nd toe with slightly thickened trimmed on medial aspect down the distal 3rd of cuticle  Psychiatric: He has a normal mood and affect. His behavior is normal.  Nursing note and vitals reviewed.   Vitals:   03/17/17 1615 03/17/17 1652  BP: (!) 148/70 138/82  Pulse: (!) 54   Resp: 18   Temp: 98.3 F (36.8 C)   TempSrc: Oral   SpO2: 98%   Weight: 253 lb 3.2 oz (114.9 kg)   Height: 5' 6.73" (1.695 m)    Body mass index is 39.98 kg/m.   BP 138/82 on recheck Assessment & Plan:  Pt filled script for #150 hydrocodone on 02/28/17 that was written on 12/10/16 - he states he only has about 10 tabs left which means he has been using 8 tabs/d.  1. Ingrown nail of second toe of right foot   2.  Nasal turbinate hypertrophy   3. Essential hypertension   4. Chronic pain syndrome - Patient has been using more than prescribed for several months in a row now and has had several acute issues requiring short-term prescriptions or early refills. Patient doesn't think he will be able to continue working with any quality of life on 50 mg of hydrocodone a day when he has been using 80 mg for several months. Advised will increase to 70 mg but strongly encouraged patient to not use this scheduled. Encouraged him to stay on 50 mg scheduled thereby allowing him to have additional for acute flares of his pain or other illnesses/injuries. Advised patient that this increase in dose is therefore meant to include sufficient quantity to cover for all acute injuries - including but not limited to dental surgery, toenail removal, sprains/strains, etc. advised patient that I really think he should be transferred to a pain clinic but patient refuses. Understands that there will be no further increase in his pain medication even years down the road as he will develop tolerance and this will become insufficient, I'm unwilling to continue to increase further. Patient really does not think that tolerance in a year or 2 is going to be an issue.  They've one early refill. Recommend return to clinic in 3 weeks to review chronic pain protocol and documentation and greater detail. Do recommend increase frequency of UDS this year prior to patient going on Medicare next year as patient did have one UDS violation on the urine before last.   I do think his chronic back pain is sig worsened by the fact that he hasn't slept more than 1-2 hrs at the longest in decades due to the OSA which is untreatable due to the nasal airway obstruction.  5. Other fatigue   6. Obstructive sleep apnea syndrome - cannot do cpap due to complete nasal obstruciton  7. BPH associated with nocturia - cont doxazosin, does not want to change dose - would cause to  much urinary freq per pt to increase to 1 tab bid.  8. Hypersomnia with sleep apnea -  fatigue becoming quite severe - I am worried he is going to fall asleep while driving - need to try to find a way to allow him to breathe o/n at all costs.  Refer to plastic surgery to see if they have anything to offer to reduce the nasal turbinate hypertrophy (as ENT surg turbinate debulking done sev yrs ago made abs no difference - nares still always completely occluded and pt is mouth breathing. Look into dental device as well - Dr. Irene LimboSandra Fuller  9. Chronic left-sided low back pain without sciatica     Orders Placed This Encounter  Procedures  . Ambulatory referral to Plastic Surgery    Referral Priority:   Routine    Referral Type:   Surgical    Referral Reason:   Specialty Services Required    Requested Specialty:   Plastic Surgery    Number of Visits Requested:   1    Meds ordered this encounter  Medications  . HYDROcodone-acetaminophen (NORCO) 10-325 MG tablet    Sig: Take 1-2 tablets by mouth every 4 (four) hours as needed.    Dispense:  210 tablet    Refill:  0    May fill on Mar 18, 2017.  . pravastatin (PRAVACHOL) 20 MG tablet    Sig: TAKE 1 TABLET (20 MG TOTAL) BY MOUTH AT BEDTIME.    Dispense:  90 tablet    Refill:  1  . doxazosin (CARDURA) 4 MG tablet    Sig: TAKE 1/2 TABLET BY MOUTH EVERY MORNING AND 1 TABLET DAILY AT BEDTIME    Dispense:  135 tablet    Refill:  1  . amLODipine (NORVASC) 5 MG tablet    Sig: Take 1 tablet (5 mg total) by mouth daily.    Dispense:  90 tablet    Refill:  1    I personally performed the services described in this documentation, which was scribed in my presence. The recorded information has been reviewed and considered, and addended by me as needed.   Troy SorensonEva Shamiyah Ngu, M.D.  [facility] at [facility]  [place] [address] [place], [place] [address] [phone] phone [phone] fax  03/19/17 10:32 PM

## 2017-04-17 ENCOUNTER — Encounter: Payer: Self-pay | Admitting: Family Medicine

## 2017-04-17 ENCOUNTER — Ambulatory Visit (INDEPENDENT_AMBULATORY_CARE_PROVIDER_SITE_OTHER): Payer: Managed Care, Other (non HMO) | Admitting: Family Medicine

## 2017-04-17 VITALS — BP 136/86 | HR 62 | Temp 97.9°F | Resp 18 | Ht 66.73 in | Wt 255.0 lb

## 2017-04-17 DIAGNOSIS — F1120 Opioid dependence, uncomplicated: Secondary | ICD-10-CM | POA: Diagnosis not present

## 2017-04-17 DIAGNOSIS — G4710 Hypersomnia, unspecified: Secondary | ICD-10-CM

## 2017-04-17 DIAGNOSIS — G4730 Sleep apnea, unspecified: Secondary | ICD-10-CM

## 2017-04-17 DIAGNOSIS — M7551 Bursitis of right shoulder: Secondary | ICD-10-CM

## 2017-04-17 DIAGNOSIS — J3489 Other specified disorders of nose and nasal sinuses: Secondary | ICD-10-CM

## 2017-04-17 DIAGNOSIS — G894 Chronic pain syndrome: Secondary | ICD-10-CM

## 2017-04-17 MED ORDER — HYDROCODONE-ACETAMINOPHEN 10-325 MG PO TABS: 1.0000 | ORAL_TABLET | ORAL | 0 refills | Status: DC | PRN

## 2017-04-17 NOTE — Progress Notes (Signed)
Subjective:    Patient ID: Troy Obrien, male    DOB: June 05, 1951, 66 y.o.   MRN: [id number] Chief Complaint  Patient presents with  . Follow-up    on meds  . Medication Refill    Hydrocodone-Acetaminophen 10-325 mg    HPI  Has a shellfish allergy - gets numbness in his hands   For the past 3 wks he has had severe right shoulder pain that wakes him from sleep  - wonders if a cortisone inj would help. Pain is much worse with pressure and worse at night.  He does lean over and reach over to press a button with his right arm and wonders if that could have exercerbated it. No other weakness or things exacerbate it. When he roles over to his right shoulder on the lateral aspect it hurts laterally and will take over an hr until it resolved enough for him to go back to sleep.  Added in naproxen qhs which he is not sure helped at all  Has not made a f/u appt with Dr. Jearld Fenton.    He took his last hydrocodone pill today.  Past Medical History:  Diagnosis Date  . Arthritis   . Glaucoma   . Gout   . Headache(784.0)   . Hyperlipidemia   . Hypertension   . Inguinal hernia   . Rickets, fetal   . Vitamin D deficiency    Past Surgical History:  Procedure Laterality Date  . carpel tunnel     bilateral  . CIRCUMCISION  1972  . INGUINAL HERNIA REPAIR  10/11/2012   Procedure: HERNIA REPAIR INGUINAL ADULT;  Surgeon: Robyne Askew, MD;  Location: [facility] OR;  Service: General;  Laterality: Right;  . INSERTION OF MESH  10/11/2012   Procedure: INSERTION OF MESH;  Surgeon: Robyne Askew, MD;  Location: [facility] OR;  Service: General;  Laterality: Right;  . TONSILLECTOMY  1963   Current Outpatient Prescriptions on File Prior to Visit  Medication Sig Dispense Refill  . amLODipine (NORVASC) 5 MG tablet Take 1 tablet (5 mg total) by mouth daily. 90 tablet 1  . aspirin 81 MG tablet Take 81 mg by mouth daily.    . bimatoprost (LUMIGAN) 0.01 % SOLN Place 1 drop into both eyes at bedtime.    Marland Kitchen doxazosin  (CARDURA) 4 MG tablet TAKE 1/2 TABLET BY MOUTH EVERY MORNING AND 1 TABLET DAILY AT BEDTIME 135 tablet 1  . Fexofenadine HCl (MUCINEX ALLERGY PO) Take by mouth.    Marland Kitchen ipratropium (ATROVENT) 0.03 % nasal spray Place 2 sprays into both nostrils every 12 (twelve) hours. 30 mL 12  . Loratadine (CLARITIN PO) Take by mouth.    . pravastatin (PRAVACHOL) 20 MG tablet TAKE 1 TABLET (20 MG TOTAL) BY MOUTH AT BEDTIME. 90 tablet 1   No current facility-administered medications on file prior to visit.    Allergies  Allergen Reactions  . Laxative [Bisacodyl] Hives  . Cyclobenzaprine Other (See Comments)    Hand twitching/spasms   No family history on file. Social History   Social History  . Marital status: Married    Spouse name: N/A  . Number of children: N/A  . Years of education: N/A   Social History Main Topics  . Smoking status: Never Smoker  . Smokeless tobacco: Never Used  . Alcohol use 0.0 oz/week     Comment: rare  . Drug use: No  . Sexual activity: Yes    Birth control/ protection: None  Other Topics Concern  . None   Social History Narrative  . None   Dep[facility] 04/17/2017 03/17/2017 02/20/2017 02/16/2017 02/11/2017  Decreased Interest 0 0 0 0 0  Down, Depressed, Hopeless 0 0 0 0 0  PHQ - 2 Score 0 0 0 0 0      Review of Systems See hpi    Objective:   Physical Exam  Constitutional: He is oriented to person, place, and time. He appears well-developed and well-nourished. No distress.  HENT:  Head: Normocephalic and atraumatic.  Eyes: Conjunctivae are normal. Pupils are equal, round, and reactive to light. No scleral icterus.  Neck: Normal range of motion. Neck supple. No thyromegaly present.  Cardiovascular: Normal rate, regular rhythm, normal heart sounds and intact distal pulses.   Pulmonary/Chest: Effort normal and breath sounds normal. No respiratory distress.  Musculoskeletal: He exhibits no edema.  Lymphadenopathy:    He has no cervical  adenopathy.  Neurological: He is alert and oriented to person, place, and time.  Skin: Skin is warm and dry. He is not diaphoretic.  Psychiatric: He has a normal mood and affect. His behavior is normal.      BP 136/86   Pulse 62   Temp 97.9 F (36.6 C) (Oral)   Resp 18   Ht 5' 6.73" (1.695 m)   Wt 255 lb (115.7 kg)   SpO2 96%   BMI 40.26 kg/m   0 but unknown family history Assessment & Plan:  Consider shellfish allergy labs and uric acid with next labs. Can't use nasal steroid due to eyes  1. Bursitis of right shoulder   2. Chronic pain syndrome   3. Opioid type dependence, continuous (HCC)   4. Nasal obstruction   5. Hypersomnia with sleep apnea      Meds ordered this encounter  Medications  . DISCONTD: HYDROcodone-acetaminophen (NORCO) 10-325 MG tablet    Sig: Take 1-2 tablets by mouth every 4 (four) hours as needed.    Dispense:  210 tablet    Refill:  0    May fill on April 17, 2017.  Marland Kitchen. HYDROcodone-acetaminophen (NORCO) 10-325 MG tablet    Sig: Take 1-2 tablets by mouth every 4 (four) hours as needed.    Dispense:  210 tablet    Refill:  0    May fill on May 17, 2017.     [name], M.D.  Primary Care at [facility]  [place] [address] [place], [place] [address] [phone] phone [phone] fax  04/19/17 10:14 PM

## 2017-04-17 NOTE — Patient Instructions (Addendum)
Dr. Irene Limbo is a dentist in town who specialized in oral/dental devices that can treat sleep apnea.  PLEASE CALL HER.  Make an appointment with Dr. Jearld Fenton - this is the next step that you have to get over with to see if Dr. Malachi Carl will be able to help you.   Continue on the allegra every night.    IF you received an x-ray today, you will receive an invoice from [facility]. Please contact [facility] at [phone] with questions or concerns regarding your invoice.   IF you received labwork today, you will receive an invoice from [facility]. Please contact [facility] at [phone] with questions or concerns regarding your invoice.   Our billing staff will not be able to assist you with questions regarding bills from these companies.  You will be contacted with the lab results as soon as they are available. The fastest way to get your results is to activate your My Chart account. Instructions are located on the last page of this paperwork. If you have not heard from [place] regarding the results in 2 weeks, please contact this office.    Bursitis Bursitis is inflammation and irritation of a bursa, which is one of the small, fluid-filled sacs that cushion and protect the moving parts of your body. These sacs are located between bones and muscles, muscle attachments, or skin areas next to bones. A bursa protects these structures from the wear and tear that results from frequent movement. An inflamed bursa causes pain and swelling. Fluid may build up inside the sac. Bursitis is most common near joints, especially the knees, elbows, hips, and shoulders. What are the causes? Bursitis can be caused by:  Injury from: ? A direct blow, like falling on your knee or elbow. ? Overuse of a joint (repetitive stress).  Infection. This can happen if bacteria gets into a bursa through a cut or scrape near a joint.  Diseases that cause joint inflammation, such as gout and rheumatoid  arthritis.  What increases the risk? You may be at risk for bursitis if you:  Have a job or hobby that involves a lot of repetitive stress on your joints.  Have a condition that weakens your body's defense system (immune system), such as diabetes, cancer, or HIV.  Lift and reach overhead often.  Kneel or lean on hard surfaces often.  Run or walk often.  What are the signs or symptoms? The most common signs and symptoms of bursitis are:  Pain that gets worse when you move the affected body part or put weight on it.  Inflammation.  Stiffness.  Other signs and symptoms may include:  Redness.  Tenderness.  Warmth.  Pain that continues after rest.  Fever and chills. This may occur in bursitis caused by infection.  How is this diagnosed? Bursitis may be diagnosed by:  Medical history and physical exam.  MRI.  A procedure to drain fluid from the bursa with a needle (aspiration). The fluid may be checked for signs of infection or gout.  Blood tests to rule out other causes of inflammation.  How is this treated? Bursitis can usually be treated at home with rest, ice, compression, and elevation (RICE). For mild bursitis, RICE treatment may be all you need. Other treatments may include:  Nonsteroidal anti-inflammatory drugs (NSAIDs) to treat pain and inflammation.  Corticosteroids to fight inflammation. You may have these drugs injected into and around the area of bursitis.  Aspiration of bursitis fluid to relieve pain and improve  movement.  Antibiotic medicine to treat an infected bursa.  A splint, brace, or walking aid.  Physical therapy if you continue to have pain or limited movement.  Surgery to remove a damaged or infected bursa. This may be needed if you have a very bad case of bursitis or if other treatments have not worked.  Follow these instructions at home:  Take medicines only as directed by your health care provider.  If you were prescribed an  antibiotic medicine, finish it all even if you start to feel better.  Rest the affected area as directed by your health care provider. ? Keep the area elevated. ? Avoid activities that make pain worse.  Apply ice to the injured area: ? Place ice in a plastic bag. ? Place a towel between your skin and the bag. ? Leave the ice on for 20 minutes, 2-3 times a day.  Use splints, braces, pads, or walking aids as directed by your health care provider.  Keep all follow-up visits as directed by your health care provider. This is important. How is this prevented?  Wear knee pads if you kneel often.  Wear sturdy running or walking shoes that fit you well.  Take regular breaks from repetitive activity.  Warm up by stretching before doing any strenuous activity.  Maintain a healthy weight or lose weight as recommended by your health care provider. Ask your health care provider if you need help.  Exercise regularly. Start any new physical activity gradually. Contact a health care provider if:  Your bursitis is not responding to treatment or home care.  You have a fever.  You have chills. This information is not intended to replace advice given to you by your health care provider. Make sure you discuss any questions you have with your health care provider. Document Released: 10/14/2000 Document Revised: 03/24/2016 Document Reviewed: 01/06/2014 Elsevier Interactive Patient Education  2018 [employer]  Shoulder Impingement Syndrome Rehab Ask your health care provider which exercises are safe for you. Do exercises exactly as told by your health care provider and adjust them as directed. It is normal to feel mild stretching, pulling, tightness, or discomfort as you do these exercises, but you should stop right away if you feel sudden pain or your pain gets worse.Do not begin these exercises until told by your health care provider. Stretching and range of motion exercise This exercise warms  up your muscles and joints and improves the movement and flexibility of your shoulder. This exercise also helps to relieve pain and stiffness. Exercise A: Passive horizontal adduction  1. Sit or stand and pull your left / right elbow across your chest, toward your other shoulder. Stop when you feel a gentle stretch in the back of your shoulder and upper arm. ? Keep your arm at shoulder height. ? Keep your arm as close to your body as you comfortably can. 2. Hold for __________ seconds. 3. Slowly return to the starting position. Repeat __________ times. Complete this exercise __________ times a day. Strengthening exercises These exercises build strength and endurance in your shoulder. Endurance is the ability to use your muscles for a long time, even after they get tired. Exercise B: External rotation, isometric 1. Stand or sit in a doorway, facing the door frame. 2. Bend your left / right elbow and place the back of your wrist against the door frame. Only your wrist should be touching the frame. Keep your upper arm at your side. 3. Gently press your wrist against  the door frame, as if you are trying to push your arm away from your abdomen. ? Avoid shrugging your shoulder while you press your hand against the door frame. Keep your shoulder blade tucked down toward the middle of your back. 4. Hold for __________ seconds. 5. Slowly release the tension, and relax your muscles completely before you do the exercise again. Repeat __________ times. Complete this exercise __________ times a day. Exercise C: Internal rotation, isometric  1. Stand or sit in a doorway, facing the door frame. 2. Bend your left / right elbow and place the inside of your wrist against the door frame. Only your wrist should be touching the frame. Keep your upper arm at your side. 3. Gently press your wrist against the door frame, as if you are trying to push your arm toward your abdomen. ? Avoid shrugging your shoulder while  you press your hand against the door frame. Keep your shoulder blade tucked down toward the middle of your back. 4. Hold for __________ seconds. 5. Slowly release the tension, and relax your muscles completely before you do the exercise again. Repeat __________ times. Complete this exercise __________ times a day. Exercise D: Scapular protraction, supine  1. Lie on your back on a firm surface. Hold a __________ weight in your left / right hand. 2. Raise your left / right arm straight into the air so your hand is directly above your shoulder joint. 3. Push the weight into the air so your shoulder lifts off of the surface that you are lying on. Do not move your head, neck, or back. 4. Hold for __________ seconds. 5. Slowly return to the starting position. Let your muscles relax completely before you repeat this exercise. Repeat __________ times. Complete this exercise __________ times a day. Exercise E: Scapular retraction  1. Sit in a stable chair without armrests, or stand. 2. Secure an exercise band to a stable object in front of you so the band is at shoulder height. 3. Hold one end of the exercise band in each hand. Your palms should face down. 4. Squeeze your shoulder blades together and move your elbows slightly behind you. Do not shrug your shoulders while you do this. 5. Hold for __________ seconds. 6. Slowly return to the starting position. Repeat __________ times. Complete this exercise __________ times a day. Exercise F: Shoulder extension  1. Sit in a stable chair without armrests, or stand. 2. Secure an exercise band to a stable object in front of you where the band is above shoulder height. 3. Hold one end of the exercise band in each hand. 4. Straighten your elbows and lift your hands up to shoulder height. 5. Squeeze your shoulder blades together and pull your hands down to the sides of your thighs. Stop when your hands are straight down by your sides. Do not let your hands  go behind your body. 6. Hold for __________ seconds. 7. Slowly return to the starting position. Repeat __________ times. Complete this exercise __________ times a day. This information is not intended to replace advice given to you by your health care provider. Make sure you discuss any questions you have with your health care provider. Document Released: 10/17/2005 Document Revised: 06/23/2016 Document Reviewed: 09/19/2015 Elsevier Interactive Patient Education  [employer].

## 2017-04-19 DIAGNOSIS — F1120 Opioid dependence, uncomplicated: Secondary | ICD-10-CM | POA: Insufficient documentation

## 2017-04-19 DIAGNOSIS — G894 Chronic pain syndrome: Secondary | ICD-10-CM | POA: Insufficient documentation

## 2017-04-19 HISTORY — DX: Opioid dependence, uncomplicated: F11.20

## 2017-04-19 HISTORY — DX: Chronic pain syndrome: G89.4

## 2017-05-31 ENCOUNTER — Ambulatory Visit (INDEPENDENT_AMBULATORY_CARE_PROVIDER_SITE_OTHER): Payer: Managed Care, Other (non HMO) | Admitting: Family Medicine

## 2017-05-31 ENCOUNTER — Encounter: Payer: Self-pay | Admitting: Family Medicine

## 2017-05-31 VITALS — BP 149/82 | HR 61 | Temp 97.7°F | Resp 17 | Ht 67.5 in | Wt 256.0 lb

## 2017-05-31 DIAGNOSIS — J3089 Other allergic rhinitis: Secondary | ICD-10-CM

## 2017-05-31 DIAGNOSIS — F1120 Opioid dependence, uncomplicated: Secondary | ICD-10-CM

## 2017-05-31 DIAGNOSIS — E785 Hyperlipidemia, unspecified: Secondary | ICD-10-CM

## 2017-05-31 DIAGNOSIS — J3489 Other specified disorders of nose and nasal sinuses: Secondary | ICD-10-CM

## 2017-05-31 DIAGNOSIS — G8929 Other chronic pain: Secondary | ICD-10-CM | POA: Diagnosis not present

## 2017-05-31 DIAGNOSIS — G894 Chronic pain syndrome: Secondary | ICD-10-CM | POA: Diagnosis not present

## 2017-05-31 DIAGNOSIS — J328 Other chronic sinusitis: Secondary | ICD-10-CM

## 2017-05-31 DIAGNOSIS — M546 Pain in thoracic spine: Secondary | ICD-10-CM | POA: Diagnosis not present

## 2017-05-31 DIAGNOSIS — I10 Essential (primary) hypertension: Secondary | ICD-10-CM

## 2017-05-31 DIAGNOSIS — E11620 Type 2 diabetes mellitus with diabetic dermatitis: Secondary | ICD-10-CM | POA: Diagnosis not present

## 2017-05-31 LAB — POCT GLYCOSYLATED HEMOGLOBIN (HGB A1C): HEMOGLOBIN A1C: 6.4

## 2017-05-31 MED ORDER — HYDROCODONE-ACETAMINOPHEN 10-325 MG PO TABS: 1.0000 | ORAL_TABLET | ORAL | 0 refills | Status: DC | PRN

## 2017-05-31 NOTE — Patient Instructions (Addendum)
Remember to be FASTING for labs at your next visit or we can do a morning time lab draw before an afternoon appointment.  Dr. Irene LimboSandra Fuller is a dentist in town who specialized in oral/dental devices that can treat sleep apnea. PLEASE CALL HER.  Make an appointment with Dr. Jearld FentonByers - this is the next step that you have to get over with to see if Dr. Malachi Carlillenger will be able to help you.   Continue on the allegra every night.  You should stay away from all cough and cold medications containing decongestant, especially phenylephrine and pseudoephedrine (will be listed under the active ingredient list).      IF you received an x-ray today, you will receive an invoice from [facility]. Please contact [facility] at [phone] with questions or concerns regarding your invoice.   IF you received labwork today, you will receive an invoice from [facility]. Please contact [facility] at [phone] with questions or concerns regarding your invoice.   Our billing staff will not be able to assist you with questions regarding bills from these companies.  You will be contacted with the lab results as soon as they are available. The fastest way to get your results is to activate your My Chart account. Instructions are located on the last page of this paperwork. If you have not heard from us regarding the results in 2 weeks, please contact this office.

## 2017-05-31 NOTE — Progress Notes (Signed)
Subjective:  By signing my name below, I, Troy Obrien, attest that this documentation has been prepared under the direction and in the presence of [name], MD Electronically Signed: Charline Bills, ED Scribe 05/31/2017 at 5:46 PM.   Patient ID: Troy Obrien, male    DOB: October 22, 1951, 66 y.o.   MRN: [id number]  Chief Complaint  Patient presents with  . Medication Refill    hydorcodoene    HPI Troy Obrien is a 66 y.o. male who presents to Primary Care at [facility] for a medication refill of hydrocodone.   Pt does not have an appointment with ENT to see Dr. Jearld Fenton; states he was told that there is nothing else he can do for him. So pt has been taking Sudafed with his last dose being yesterday. Pt states that he also took a piece of a Viagra tablet which caused a HA and he suspects both Viagra and Sudafed have elevated BP. Pt does not check his BP outside of the office; does not have a home cuff.   Pt has noticed a few razor bumps to right side of face from shaving. He applies warm compresses to his face and shaving cream prior to shaving and and alcohol afterwards.   Past Medical History:  Diagnosis Date  . Arthritis   . Glaucoma   . Gout   . Headache(784.0)   . Hyperlipidemia   . Hypertension   . Inguinal hernia   . Rickets, fetal   . Vitamin D deficiency    Current Outpatient Prescriptions on File Prior to Visit  Medication Sig Dispense Refill  . amLODipine (NORVASC) 5 MG tablet Take 1 tablet (5 mg total) by mouth daily. 90 tablet 1  . aspirin 81 MG tablet Take 81 mg by mouth daily.    . bimatoprost (LUMIGAN) 0.01 % SOLN Place 1 drop into both eyes at bedtime.    Marland Kitchen doxazosin (CARDURA) 4 MG tablet TAKE 1/2 TABLET BY MOUTH EVERY MORNING AND 1 TABLET DAILY AT BEDTIME 135 tablet 1  . Fexofenadine HCl (MUCINEX ALLERGY PO) Take by mouth.    Marland Kitchen HYDROcodone-acetaminophen (NORCO) 10-325 MG tablet Take 1-2 tablets by mouth every 4 (four) hours as needed. 210 tablet 0  . ipratropium  (ATROVENT) 0.03 % nasal spray Place 2 sprays into both nostrils every 12 (twelve) hours. 30 mL 12  . Loratadine (CLARITIN PO) Take by mouth.    . pravastatin (PRAVACHOL) 20 MG tablet TAKE 1 TABLET (20 MG TOTAL) BY MOUTH AT BEDTIME. 90 tablet 1   No current facility-administered medications on file prior to visit.    Allergies  Allergen Reactions  . Laxative [Bisacodyl] Hives  . Cyclobenzaprine Other (See Comments)    Hand twitching/spasms   Past Surgical History:  Procedure Laterality Date  . carpel tunnel     bilateral  . CIRCUMCISION  1972  . INGUINAL HERNIA REPAIR  10/11/2012   Procedure: HERNIA REPAIR INGUINAL ADULT;  Surgeon: Robyne Askew, MD;  Location: [facility] OR;  Service: General;  Laterality: Right;  . INSERTION OF MESH  10/11/2012   Procedure: INSERTION OF MESH;  Surgeon: Robyne Askew, MD;  Location: [facility] OR;  Service: General;  Laterality: Right;  . TONSILLECTOMY  1963   No family history on file. Social History   Social History  . Marital status: Married    Spouse name: N/A  . Number of children: N/A  . Years of education: N/A   Social History Main Topics  .  Smoking status: Never Smoker  . Smokeless tobacco: Never Used  . Alcohol use 0.0 oz/week     Comment: rare  . Drug use: No  . Sexual activity: Yes    Birth control/ protection: None   Other Topics Concern  . None   Social History Narrative  . None   Depression screen [facility] 2/9 05/31/2017 04/17/2017 03/17/2017 02/20/2017 02/16/2017  Decreased Interest 0 0 0 0 0  Down, Depressed, Hopeless 0 0 0 0 0  PHQ - 2 Score 0 0 0 0 0    Review of Systems  Skin: Positive for rash (R side of face).      Objective:   Physical Exam  Constitutional: He is oriented to person, place, and time. He appears well-developed and well-nourished. No distress.  HENT:  Head: Normocephalic and atraumatic.  Audible breathing. Mouth open for breathing.  Eyes: Conjunctivae and EOM are normal.  Neck: Neck supple. No tracheal  deviation present.  Cardiovascular: Normal rate.   Pulmonary/Chest: Effort normal. No respiratory distress.  Musculoskeletal: Normal range of motion.  Neurological: He is alert and oriented to person, place, and time.  Skin: Skin is warm and dry.  Enlarged hyperpigmented hair follicles with few excoriations to right side of face.  Psychiatric: He has a normal mood and affect. His behavior is normal.  Nursing note and vitals reviewed.  BP (!) 149/82   Pulse 61   Temp 97.7 F (36.5 C) (Oral)   Resp 17   Ht 5' 7.5" (1.715 m)   Wt 256 lb (116.1 kg)   SpO2 98%   BMI 39.50 kg/m    Results for orders placed or performed in visit on 05/31/17  POCT glycosylated hemoglobin (Hb A1C)  Result Value Ref Range   Hemoglobin A1C 6.4       Assessment & Plan:   1. Type 2 diabetes mellitus with diabetic dermatitis, without long-term current use of insulin (HCC)   2. Essential hypertension   3. Other chronic sinusitis   4. Non-seasonal allergic rhinitis, unspecified trigger   5. Nasal obstruction   6. Chronic pain syndrome   7. Opioid type dependence, continuous (HCC)   8. Hyperlipidemia, unspecified hyperlipidemia type   9. Morbid obesity due to excess calories (HCC)   10. Chronic right-sided thoracic back pain     Orders Placed This Encounter  Procedures  . ToxASSURE Select 13 (MW), Urine  . Comprehensive metabolic panel  . Microalbumin/Creatinine Ratio, Urine  . POCT glycosylated hemoglobin (Hb A1C)    Meds ordered this encounter  Medications  . DISCONTD: HYDROcodone-acetaminophen (NORCO) 10-325 MG tablet    Sig: Take 1-2 tablets by mouth every 4 (four) hours as needed.    Dispense:  210 tablet    Refill:  0    May fill on June 17, 2017.  Marland Kitchen. DISCONTD: HYDROcodone-acetaminophen (NORCO) 10-325 MG tablet    Sig: Take 1-2 tablets by mouth every 4 (four) hours as needed.    Dispense:  210 tablet    Refill:  0    May fill on July 18, 2017.  Marland Kitchen. HYDROcodone-acetaminophen  (NORCO) 10-325 MG tablet    Sig: Take 1-2 tablets by mouth every 4 (four) hours as needed.    Dispense:  210 tablet    Refill:  0    May fill on August 17, 2017.    I personally performed the services described in this documentation, which was scribed in my presence. The recorded information has been reviewed and considered, and  addended by me as needed.   [name], M.D.  [facility]  [place] [address] [place], [place] [address] [phone] phone [phone] fax  06/01/17 11:59 PM

## 2017-06-01 LAB — COMPREHENSIVE METABOLIC PANEL
ALT: 13 IU/L (ref 0–44)
AST: 18 IU/L (ref 0–40)
Albumin/Globulin Ratio: 1.4 (ref 1.2–2.2)
Albumin: 4.6 g/dL (ref 3.6–4.8)
Alkaline Phosphatase: 53 IU/L (ref 39–117)
BUN/Creatinine Ratio: 14 (ref 10–24)
BUN: 15 mg/dL (ref 8–27)
Bilirubin Total: 0.4 mg/dL (ref 0.0–1.2)
CALCIUM: 9.3 mg/dL (ref 8.6–10.2)
CHLORIDE: 102 mmol/L (ref 96–106)
CO2: 23 mmol/L (ref 20–29)
CREATININE: 1.05 mg/dL (ref 0.76–1.27)
GFR calc Af Amer: 86 mL/min/{1.73_m2} (ref >59)
GFR, EST NON AFRICAN AMERICAN: 75 mL/min/{1.73_m2} (ref >59)
GLUCOSE: 94 mg/dL (ref 65–99)
Globulin, Total: 3.2 g/dL (ref 1.5–4.5)
POTASSIUM: 4.5 mmol/L (ref 3.5–5.2)
SODIUM: 138 mmol/L (ref 134–144)
TOTAL PROTEIN: 7.8 g/dL (ref 6.0–8.5)

## 2017-06-06 ENCOUNTER — Encounter: Payer: Self-pay | Admitting: Radiology

## 2017-06-06 LAB — MICROALBUMIN / CREATININE URINE RATIO
Creatinine, Urine: 197.9 mg/dL
MICROALB/CREAT RATIO: 6.5 mg/g{creat} (ref 0.0–30.0)
MICROALBUM., U, RANDOM: 12.8 ug/mL

## 2017-06-06 LAB — TOXASSURE SELECT 13 (MW), URINE

## 2017-06-12 ENCOUNTER — Ambulatory Visit: Payer: Managed Care, Other (non HMO) | Admitting: Family Medicine

## 2017-06-20 ENCOUNTER — Telehealth: Payer: Self-pay | Admitting: Family Medicine

## 2017-06-20 DIAGNOSIS — J343 Hypertrophy of nasal turbinates: Secondary | ICD-10-CM

## 2017-06-20 NOTE — Telephone Encounter (Signed)
Pt called and said Dr. Clelia Croft wanted him to go back and see a previous doctor he had seen for his sinuses. He said he believes it is Dr. Jearld Fenton at [facility]. Pt stated he thinks he will need a referral for this. Please advise. Thanks!

## 2017-06-30 NOTE — Telephone Encounter (Signed)
Referral placed.

## 2017-07-27 DIAGNOSIS — H25813 Combined forms of age-related cataract, bilateral: Secondary | ICD-10-CM

## 2017-07-27 HISTORY — DX: Combined forms of age-related cataract, bilateral: H25.813

## 2017-09-05 DIAGNOSIS — J343 Hypertrophy of nasal turbinates: Secondary | ICD-10-CM | POA: Insufficient documentation

## 2017-09-05 DIAGNOSIS — G8929 Other chronic pain: Secondary | ICD-10-CM | POA: Insufficient documentation

## 2017-09-05 DIAGNOSIS — M545 Low back pain, unspecified: Secondary | ICD-10-CM | POA: Insufficient documentation

## 2017-09-05 HISTORY — DX: Hypertrophy of nasal turbinates: J34.3

## 2017-09-05 HISTORY — DX: Low back pain, unspecified: M54.50

## 2017-09-05 HISTORY — DX: Other chronic pain: G89.29

## 2017-09-05 NOTE — Progress Notes (Deleted)
Subjective:    Patient ID: Troy Obrien, male    DOB: [birth year], 66 y.o.   MRN: [id number]  HPI  Troy Obrien is a delightful 66 yo here today for 3 mo f/u and refills of his chronic pain medication and review of other chronic medical conditions.  Chronic pain syndrome/opioid dependence: Indication for chronic opioid: chronic thoracic and lumbar back pain Medication and dose: hydrocodone # pills per month: 210 - last rx was given to fill on 08/17/2017 Last UDS date: 05/31/2017 - appropriate   Pain contract signed (Y/N): Yes 10/06/2016 Date narcotic database last reviewed (include red flags): today 09/06/2017  DMII: Diagnosed 04/11/2014 a1c 6.6 highest.  All a1c have been between 6-6.5 since then. At last visit, his a1c was the highest it had been since 10/2015.   Lab Results  Component Value Date   HGBA1C 6.4 05/31/2017   HGBA1C 6.3 (H) 10/06/2016   HGBA1C 6.2 (H) 05/04/2016   CBGs: fasting a.m. ; after meal  ; No hypoglycemic episodes.  Meter type:  Diet:  Exercising:  DM Med Regimen: none - diet controlled. Prior changes:   EGFR: 67-86  Baseline Cr: 1.05-1.3  Last checked 05/31/2017. Microalb: Normal 05/31/2017. Was on losartan for sev yrs prior until suddenly developed acute renal failure ~03/2014 - Cr bumped 1.18 -> 1.93 so held and it resolved, never restarted. Lipids:  LDL 99,  non-HDL 124.  Last levels done 01/23/16 at goal on pravastatin 20. Taking asa 81 qd.  Optho: Seen regularly by Dr. Edilia Bo and team- last exam 03/13/17 Feet: Monofilament exam done . Denies any no problems.  Not seen by podiatry prior.  Immunizations:  Influenza: done last yr  Pneumovax-23: none in epic but will turn 66 yo in 4 mos so will wait until then so can do prevnar-13 first  Nasal turbinate hypertrophy causing chronic complete nasal airway obstruction.  Causes recurrent sinusitis though has been better since his turbinate reduction surgery in 2015. Only breathes through mouth so though pt clearly has OSA  - he appears exhausted constantly - he was unable to tolerate CPAP but has not retried since his turbinate reduction surgery 2015.  He had trouble w/ getting insurance authorization for in lab testing (seen by Dr. Westley Hummer through [facility]). Saw Dr. Janace Hoard, ENT, again 08/04/2017 on recommendation of plastic surgeon Dr. Audelia Hives who rec pt get a CT scan and then f/u after.  On allegra qhs. Occ sudafed (though is aware he should not take due to HTN), occ afrin (aware not to use >3d due to rebound cong).  Been on dymista, azelastine, flonase and numerous other nasal steroids, and ipratopium for long time all w/o benefit so always stops the nasal sprays after serv mos. Advised to see if he would be a candidate for at home sleep study and mouth guard/corrective oral device with dentist Dr. Augustina Mood.  Glaucoma - follows w/ optho at [facility] - Dr. Edilia Bo.  HLD: 01/23/16 LDL 99, non-HDL 124 - at goal LDL <100 on pravastatin 20.  HTN: On amlodipine 5 and cardura 35m qam and 469mqhs. does not check his BP outside of the office; does not have a home cuff. Prev was on losartan-hctz for sev yrs but stopped after sudden acute renal failure ~03/2014.  Vitamin D def: nml 01/23/16 - last check. Supp? 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     BPH: cardura  Review of Systems     Objective:   Physical Exam        Assessment & Plan:  At last visit, was instructed to come in this a.m. For fasting labs. Flu shot. Will wait for 4 more mos, then give prevnar-13 Foot exam,  hep C, a1c, lipid, testosterone, tsh, vit D, cmp If pt never saw urology - need to add on psa - llast was 02/01/15

## 2017-09-06 ENCOUNTER — Ambulatory Visit (INDEPENDENT_AMBULATORY_CARE_PROVIDER_SITE_OTHER): Payer: Managed Care, Other (non HMO) | Admitting: Family Medicine

## 2017-09-06 ENCOUNTER — Encounter: Payer: Self-pay | Admitting: Family Medicine

## 2017-09-06 VITALS — BP 122/74 | HR 52 | Temp 98.4°F | Resp 17 | Ht 67.5 in | Wt 256.0 lb

## 2017-09-06 DIAGNOSIS — G8929 Other chronic pain: Secondary | ICD-10-CM

## 2017-09-06 DIAGNOSIS — Z5181 Encounter for therapeutic drug level monitoring: Secondary | ICD-10-CM | POA: Diagnosis not present

## 2017-09-06 DIAGNOSIS — G894 Chronic pain syndrome: Secondary | ICD-10-CM | POA: Diagnosis not present

## 2017-09-06 DIAGNOSIS — J3489 Other specified disorders of nose and nasal sinuses: Secondary | ICD-10-CM | POA: Diagnosis not present

## 2017-09-06 DIAGNOSIS — E785 Hyperlipidemia, unspecified: Secondary | ICD-10-CM

## 2017-09-06 DIAGNOSIS — Z125 Encounter for screening for malignant neoplasm of prostate: Secondary | ICD-10-CM

## 2017-09-06 DIAGNOSIS — I10 Essential (primary) hypertension: Secondary | ICD-10-CM | POA: Diagnosis not present

## 2017-09-06 DIAGNOSIS — F1120 Opioid dependence, uncomplicated: Secondary | ICD-10-CM | POA: Diagnosis not present

## 2017-09-06 DIAGNOSIS — M546 Pain in thoracic spine: Secondary | ICD-10-CM

## 2017-09-06 DIAGNOSIS — E559 Vitamin D deficiency, unspecified: Secondary | ICD-10-CM

## 2017-09-06 DIAGNOSIS — E11620 Type 2 diabetes mellitus with diabetic dermatitis: Secondary | ICD-10-CM

## 2017-09-06 DIAGNOSIS — R7989 Other specified abnormal findings of blood chemistry: Secondary | ICD-10-CM | POA: Diagnosis not present

## 2017-09-06 DIAGNOSIS — G4733 Obstructive sleep apnea (adult) (pediatric): Secondary | ICD-10-CM | POA: Diagnosis not present

## 2017-09-06 DIAGNOSIS — M545 Low back pain: Secondary | ICD-10-CM | POA: Diagnosis not present

## 2017-09-06 DIAGNOSIS — J343 Hypertrophy of nasal turbinates: Secondary | ICD-10-CM | POA: Diagnosis not present

## 2017-09-06 DIAGNOSIS — Z119 Encounter for screening for infectious and parasitic diseases, unspecified: Secondary | ICD-10-CM

## 2017-09-06 LAB — POCT GLYCOSYLATED HEMOGLOBIN (HGB A1C): Hemoglobin A1C: 6.6

## 2017-09-06 MED ORDER — HYDROCODONE-ACETAMINOPHEN 10-325 MG PO TABS: 1.0000 | ORAL_TABLET | ORAL | 0 refills | Status: DC | PRN

## 2017-09-06 MED ORDER — SILDENAFIL CITRATE 20 MG PO TABS: 40.0000 mg | ORAL_TABLET | ORAL | 3 refills | Status: DC | PRN

## 2017-09-06 NOTE — Patient Instructions (Signed)
     IF you received an x-ray today, you will receive an invoice from [facility]. Please contact [facility] at [phone] with questions or concerns regarding your invoice.   IF you received labwork today, you will receive an invoice from [facility]. Please contact [facility] at [phone] with questions or concerns regarding your invoice.   Our billing staff will not be able to assist you with questions regarding bills from these companies.  You will be contacted with the lab results as soon as they are available. The fastest way to get your results is to activate your My Chart account. Instructions are located on the last page of this paperwork. If you have not heard from us regarding the results in 2 weeks, please contact this office.     

## 2017-09-06 NOTE — Progress Notes (Signed)
Subjective:  By signing my name below, I, Essence Howell, attest that this documentation has been prepared under the direction and in the presence of Delman Cheadle, MD Electronically Signed: Ladene Artist, ED Scribe 09/06/2017 at 5:56 PM.   Patient ID: Troy Obrien, male    DOB: Nov 10, 1950, 66 y.o.   MRN: [id number]  Chief Complaint  Patient presents with  . Medication Refill    pain med,   HPI  Troy Obrien is a delightful 66 yo here today for 3 mo f/u and refills of his chronic pain medication and review of other chronic medical conditions.   Chronic pain syndrome/opioid dependence: Indication for chronic opioid: chronic thoracic and lumbar back pain Medication and dose: hydrocodone # pills per month: 210 - last rx was given to fill on 08/17/2017 Last UDS date: 05/31/2017 - appropriate Pain contract signed (Y/N): Yes 10/06/2016 Date narcotic database last reviewed (include red flags): today 09/06/2017  DMII: Diagnosed 04/11/2014 a1c 6.6 highest.  All a1c have been between 6-6.5 since then. At last visit, his a1c was the highest it had been since 10/2015.   Lab Results  Component Value Date   HGBA1C 6.4 05/31/2017   HGBA1C 6.3 (H) 10/06/2016   HGBA1C 6.2 (H) 05/04/2016   CBGs: Does not check his cbgs outside of the office. No hypoglycemic episodes. Denies changes to  bowels/urine. Meter type:  Diet:  Exercising:  DM Med Regimen: none - diet controlled. Prior changes:   EGFR: 67-86  Baseline Cr: 1.05-1.3  Last checked 05/31/2017. Microalb: Normal 05/31/2017. Was on losartan for sev yrs prior until suddenly developed acute renal failure ~03/2014 - Cr bumped 1.18 -> 1.93 so held and it resolved, never restarted. Lipids:  LDL 99,  non-HDL 124.  Last levels done 01/23/16 at goal on pravastatin 20. Taking asa 81 qd.  Optho: Seen regularly by Dr. Edilia Bo and team- last exam 03/13/17 Feet: Monofilament exam done . Denies any no problems.  Not seen by podiatry prior.  Immunizations:  Influenza: done at  pharmacy   Pneumovax-23: none in epic but will turn 66 yo in 4 mos so will wait until then so can do prevnar-13 first  Nasal turbinate hypertrophy causing chronic complete nasal airway obstruction. Causes recurrent sinusitis though has been better since his turbinate reduction surgery in 2015. Only breathes through mouth so though pt clearly has OSA - he appears exhausted constantly - he was unable to tolerate CPAP but has not retried since his turbinate reduction surgery 2015.  He had trouble w/ getting insurance authorization for in lab testing (seen by Dr. Westley Hummer through [facility]). Saw Dr. Janace Hoard, ENT, again 08/04/2017 on recommendation of plastic surgeon Dr. Audelia Hives who rec pt get a CT scan and then f/u after. On allegra qhs. Occ sudafed (though is aware he should not take due to HTN), occ afrin (aware not to use >3d due to rebound cong).  Been on dymista, azelastine, flonase and numerous other nasal steroids, and ipratopium for long time all w/o benefit so always stops the nasal sprays after serv mos. Advised to see if he would be a candidate for at home sleep study and mouth guard/corrective oral device with dentist Dr. Augustina Mood.  Glaucoma - follows w/ optho at [facility] - Dr. Edilia Bo.  HLD: 01/23/16 LDL 99, non-HDL 124 - at goal LDL <100 on pravastatin 20.  HTN: On amlodipine 5 and cardura 36m qam and 460mqhs. does not check his BP outside of the office; does not have a  home cuff. Prev was on losartan-hctz for sev yrs but stopped after sudden acute renal failure ~03/2014.  Vitamin D def: nml 01/23/16 - last check. Supp? 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 Still having hot flashes.   BPH: cardura  Past Medical History:  Diagnosis  Date  . Arthritis   . Glaucoma   . Gout   . Headache(784.0)   . Hyperlipidemia   . Hypertension   . Inguinal hernia   . Rickets, fetal   . Vitamin D deficiency    Past Surgical History:  Procedure Laterality Date  . carpel tunnel     bilateral  . CIRCUMCISION  1972  . TONSILLECTOMY  1963   Current Outpatient Medications on File Prior to Visit  Medication Sig Dispense Refill  . aspirin 81 MG tablet Take 81 mg by mouth daily.    . bimatoprost (LUMIGAN) 0.01 % SOLN Place 1 drop into both eyes at bedtime.    Marland Kitchen doxazosin (CARDURA) 4 MG tablet TAKE 1/2 TABLET BY MOUTH EVERY MORNING AND 1 TABLET DAILY AT BEDTIME 135 tablet 1  . Fexofenadine HCl (MUCINEX ALLERGY PO) Take by mouth.    Marland Kitchen ipratropium (ATROVENT) 0.03 % nasal spray Place 2 sprays into both nostrils every 12 (twelve) hours. 30 mL 12  . Loratadine (CLARITIN PO) Take by mouth.    . pravastatin (PRAVACHOL) 20 MG tablet TAKE 1 TABLET (20 MG TOTAL) BY MOUTH AT BEDTIME. 90 tablet 1   No current facility-administered medications on file prior to visit.    Allergies  Allergen Reactions  . Laxative [Bisacodyl] Hives  . Cyclobenzaprine Other (See Comments)    Hand twitching/spasms   No family history on file. Social History   Socioeconomic History  . Marital status: Married    Spouse name: None  . Number of children: None  . Years of education: None  . Highest education level: None  Social Needs  . Financial resource strain: None  . Food insecurity - worry: None  . Food insecurity - inability: None  . Transportation needs - medical: None  . Transportation needs - non-medical: None  Occupational History  . None  Tobacco Use  . Smoking status: Never Smoker  . Smokeless tobacco: Never Used  Substance and Sexual Activity  . Alcohol use: Yes    Alcohol/week: 0.0 oz    Comment: rare  . Drug use: No  . Sexual activity: Yes    Birth control/protection: None  Other Topics Concern  . None  Social History Narrative  .  None   Depression screen [facility] 2/9 09/06/2017 05/31/2017 04/17/2017 03/17/2017 02/20/2017  Decreased Interest 0 0 0 0 0  Down, Depressed, Hopeless 0 0 0 0 0  PHQ - 2 Score 0 0 0 0 0    Review of Systems  Constitutional: Positive for diaphoresis (hot flashes).  Gastrointestinal: Negative for constipation and diarrhea.  Genitourinary: Negative for difficulty urinating and hematuria.      Objective:   Physical Exam  Constitutional: He is oriented to person, place, and time. He appears well-developed and well-nourished. No distress.  HENT:  Head: Normocephalic and atraumatic.  Eyes: Conjunctivae and EOM are normal.  Neck: Neck supple. No tracheal deviation present. No thyromegaly present.  Cardiovascular: Normal rate and regular rhythm.  Pulmonary/Chest: Effort normal and breath sounds normal. No respiratory distress.  Musculoskeletal: Normal range of motion.  Lymphadenopathy:    He has no cervical adenopathy.  Neurological: He is alert and oriented to person, place, and time.  Skin: Skin is warm and dry.  Psychiatric: He has a normal mood and affect. His behavior is normal.  Nursing note and vitals reviewed.  BP 122/74   Pulse (!) 52   Temp 98.4 F (36.9 C) (Oral)   Resp 17   Ht 5' 7.5" (1.715 m)   Wt 256 lb (116.1 kg)   SpO2 98%   BMI 39.50 kg/m     Results for orders placed or performed in visit on 09/06/17  POCT glycosylated hemoglobin (Hb A1C)  Result Value Ref Range   Hemoglobin A1C 6.6    Assessment & Plan:   At last visit, was instructed to come in this a.m. For fasting labs. Flu shot. Will wait for 4 more mos, then give prevnar-13 Foot exam,  hep C, a1c, lipid, testosterone, tsh, vit D, cmp If pt never saw urology - need to add on psa - llast was 02/01/15  1. Type 2 diabetes mellitus with diabetic dermatitis, without long-term current use of insulin ([place])   2. Essential hypertension   3. Obstructive sleep apnea syndrome   4. Nasal turbinate hypertrophy   5. Nasal  obstruction   6. Chronic pain syndrome   7. Opioid type dependence, continuous ([place])   8. Hyperlipidemia, unspecified hyperlipidemia type   9. Chronic left-sided low back pain without sciatica   10. Chronic right-sided thoracic back pain   11. Medication monitoring encounter   12. 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 Screening examination for infectious disease   14. Screening for prostate cancer   15. Vitamin D deficiency     Orders Placed This Encounter  Procedures  . HCV Ab w/Rflx to Verification  . Lipid panel    Order Specific Question:   Has the patient fasted?    Answer:   Yes  . TestT+TestF+SHBG  . TSH  . VITAMIN D 25 Hydroxy (Vit-D Deficiency, Fractures)  . Comprehensive metabolic panel    Order Specific Question:   Has the patient fasted?    Answer:   Yes  . Interpretation:  . POCT glycosylated hemoglobin (Hb A1C)  . HM DIABETES FOOT EXAM    Meds ordered this encounter  Medications  . DISCONTD: HYDROcodone-acetaminophen (NORCO) 10-325 MG tablet    Sig: Take 1-2 tablets every 4 (four) hours as needed by mouth.    Dispense:  210 tablet    Refill:  0    May fill on September 07, 2017  . sildenafil (REVATIO) 20 MG tablet    Sig: Take 2 tablets (40 mg total) as needed by mouth. 30 minutes prior to activity no more than every 24 hours    Dispense:  30 tablet    Refill:  3  . DISCONTD: HYDROcodone-acetaminophen (NORCO) 10-325 MG tablet    Sig: Take 1-2 tablets every 4 (four) hours as needed by mouth.    Dispense:  210 tablet    Refill:  0    May fill on October 07, 2017  . DISCONTD: HYDROcodone-acetaminophen (NORCO) 10-325 MG tablet    Sig: Take 1-2 tablets every 4 (four) hours as needed by mouth.    Dispense:  210 tablet    Refill:  0    May fill on November 07, 2017  . HYDROcodone-acetaminophen (NORCO) 10-325 MG tablet    Sig: Take 1-2 tablets every 4 (four) hours as needed by mouth.    Dispense:  210 tablet    Refill:  0    May fill on September 06, 2017      I personally performed the services described in this documentation, which was scribed in my presence. The recorded information has been reviewed and considered, and addended by me as needed.   Delman Cheadle, M.D.  [facility] [address] [place], [place] [address] [phone] phone [phone] fax  09/09/17 1:40 PM

## 2017-09-07 ENCOUNTER — Other Ambulatory Visit: Payer: Self-pay | Admitting: Family Medicine

## 2017-09-08 LAB — COMPREHENSIVE METABOLIC PANEL
A/G RATIO: 1.5 (ref 1.2–2.2)
ALBUMIN: 4.4 g/dL (ref 3.6–4.8)
ALT: 16 IU/L (ref 0–44)
AST: 16 IU/L (ref 0–40)
Alkaline Phosphatase: 43 IU/L (ref 39–117)
BILIRUBIN TOTAL: 0.4 mg/dL (ref 0.0–1.2)
BUN / CREAT RATIO: 11 (ref 10–24)
BUN: 14 mg/dL (ref 8–27)
CHLORIDE: 102 mmol/L (ref 96–106)
CO2: 28 mmol/L (ref 20–29)
Calcium: 9.6 mg/dL (ref 8.6–10.2)
Creatinine, Ser: 1.24 mg/dL (ref 0.76–1.27)
GFR calc non Af Amer: 61 mL/min/{1.73_m2} (ref >59)
GFR, EST AFRICAN AMERICAN: 71 mL/min/{1.73_m2} (ref >59)
Globulin, Total: 2.9 g/dL (ref 1.5–4.5)
Glucose: 92 mg/dL (ref 65–99)
POTASSIUM: 4.5 mmol/L (ref 3.5–5.2)
Sodium: 140 mmol/L (ref 134–144)
TOTAL PROTEIN: 7.3 g/dL (ref 6.0–8.5)

## 2017-09-08 LAB — TESTT+TESTF+SHBG
Sex Hormone Binding: 21.9 nmol/L (ref 19.3–76.4)
TESTOSTERONE FREE: 3.9 pg/mL — AB (ref 6.6–18.1)
TESTOSTERONE, TOTAL: 148.7 ng/dL — AB (ref 264.0–916.0)

## 2017-09-08 LAB — HCV INTERPRETATION

## 2017-09-08 LAB — LIPID PANEL
CHOL/HDL RATIO: 3.1 ratio (ref 0.0–5.0)
Cholesterol, Total: 160 mg/dL (ref 100–199)
HDL: 52 mg/dL (ref >39)
LDL CALC: 91 mg/dL (ref 0–99)
Triglycerides: 86 mg/dL (ref 0–149)
VLDL Cholesterol Cal: 17 mg/dL (ref 5–40)

## 2017-09-08 LAB — VITAMIN D 25 HYDROXY (VIT D DEFICIENCY, FRACTURES): Vit D, 25-Hydroxy: 17.3 ng/mL — ABNORMAL LOW (ref 30.0–100.0)

## 2017-09-08 LAB — TSH: TSH: 1.49 u[IU]/mL (ref 0.450–4.500)

## 2017-09-08 LAB — HCV AB W/RFLX TO VERIFICATION: HCV Ab: <0.1 s/co ratio (ref 0.0–0.9)

## 2017-09-25 ENCOUNTER — Encounter: Payer: Self-pay | Admitting: Family Medicine

## 2017-09-25 ENCOUNTER — Ambulatory Visit: Payer: Managed Care, Other (non HMO) | Admitting: Family Medicine

## 2017-09-25 ENCOUNTER — Other Ambulatory Visit: Payer: Self-pay

## 2017-09-25 VITALS — BP 130/86 | HR 75 | Temp 98.6°F | Resp 18 | Ht 67.5 in | Wt 249.8 lb

## 2017-09-25 DIAGNOSIS — B9689 Other specified bacterial agents as the cause of diseases classified elsewhere: Secondary | ICD-10-CM

## 2017-09-25 DIAGNOSIS — H66002 Acute suppurative otitis media without spontaneous rupture of ear drum, left ear: Secondary | ICD-10-CM

## 2017-09-25 DIAGNOSIS — J00 Acute nasopharyngitis [common cold]: Secondary | ICD-10-CM | POA: Diagnosis not present

## 2017-09-25 DIAGNOSIS — J329 Chronic sinusitis, unspecified: Secondary | ICD-10-CM | POA: Diagnosis not present

## 2017-09-25 MED ORDER — FLUTICASONE PROPIONATE 50 MCG/ACT NA SUSP: 2.0000 | Freq: Every day | NASAL | 6 refills | Status: DC

## 2017-09-25 MED ORDER — AMOXICILLIN-POT CLAVULANATE 875-125 MG PO TABS: 1.0000 | ORAL_TABLET | Freq: Two times a day (BID) | ORAL | 0 refills | Status: DC

## 2017-09-25 NOTE — Progress Notes (Signed)
Chief Complaint  Patient presents with  . Head Congestion    x3 days, pt states he has sinus congestion and chest congestion. Pt states it hurts his chest to sneeze or cough. Pt states he tried OTC Sudafed.    HPI  4 review of systems  Past Medical History:  Diagnosis Date  . Arthritis   . Glaucoma   . Gout   . Headache(784.0)   . Hyperlipidemia   . Hypertension   . Inguinal hernia   . Rickets, fetal   . Vitamin D deficiency     Current Outpatient Medications  Medication Sig Dispense Refill  . amLODipine (NORVASC) 5 MG tablet TAKE 1 TABLET BY MOUTH EVERY DAY 90 tablet 0  . aspirin 81 MG tablet Take 81 mg by mouth daily.    . bimatoprost (LUMIGAN) 0.01 % SOLN Place 1 drop into both eyes at bedtime.    Marland Kitchen. doxazosin (CARDURA) 4 MG tablet TAKE 1/2 TABLET BY MOUTH EVERY MORNING AND 1 TABLET DAILY AT BEDTIME 135 tablet 1  . Fexofenadine HCl (MUCINEX ALLERGY PO) Take by mouth.    Marland Kitchen. HYDROcodone-acetaminophen (NORCO) 10-325 MG tablet Take 1-2 tablets every 4 (four) hours as needed by mouth. 210 tablet 0  . ipratropium (ATROVENT) 0.03 % nasal spray Place 2 sprays into both nostrils every 12 (twelve) hours. 30 mL 12  . Loratadine (CLARITIN PO) Take by mouth.    . pravastatin (PRAVACHOL) 20 MG tablet TAKE 1 TABLET (20 MG TOTAL) BY MOUTH AT BEDTIME. 90 tablet 1  . sildenafil (REVATIO) 20 MG tablet Take 2 tablets (40 mg total) as needed by mouth. 30 minutes prior to activity no more than every 24 hours 30 tablet 3  . amoxicillin-clavulanate (AUGMENTIN) 875-125 MG tablet Take 1 tablet by mouth 2 (two) times daily. 20 tablet 0  . fluticasone (FLONASE) 50 MCG/ACT nasal spray Place 2 sprays into both nostrils daily. 16 g 6   No current facility-administered medications for this visit.     Allergies:  Allergies  Allergen Reactions  . Laxative [Bisacodyl] Hives  . Cyclobenzaprine Other (See Comments)    Hand twitching/spasms    Past Surgical History:  Procedure Laterality Date  .  carpel tunnel     bilateral  . CIRCUMCISION  1972  . INGUINAL HERNIA REPAIR  10/11/2012   Procedure: HERNIA REPAIR INGUINAL ADULT;  Surgeon: Robyne AskewPaul S Toth III, MD;  [facility];  Service: General;  Laterality: Right;  . INSERTION OF MESH  10/11/2012   Procedure: INSERTION OF MESH;  Surgeon: Robyne AskewPaul S Toth III, MD;  [facility];  Service: General;  Laterality: Right;  . TONSILLECTOMY  1963    Social History   Socioeconomic History  . Marital status: Married    Spouse name: None  . Number of children: None  . Years of education: None  . Highest education level: None  Social Needs  . Financial resource strain: None  . Food insecurity - worry: None  . Food insecurity - inability: None  . Transportation needs - medical: None  . Transportation needs - non-medical: None  Occupational History  . None  Tobacco Use  . Smoking status: Never Smoker  . Smokeless tobacco: Never Used  Substance and Sexual Activity  . Alcohol use: Yes    Alcohol/week: 0.0 oz    Comment: rare  . Drug use: No  . Sexual activity: Yes    Birth control/protection: None  Other Topics Concern  . None  Social History Narrative  .  None    History reviewed. No pertinent family history.   ROS Review of Systems See HPI Constitution: No fevers or chills No malaise No diaphoresis Skin: No rash or itching Eyes: no blurry vision, no double vision GU: no dysuria or hematuria Neuro: no dizziness or headaches * all others reviewed and negative   Objective: Vitals:   09/25/17 1513  BP: 130/86  Pulse: 75  Resp: 18  Temp: 98.6 F (37 C)  TempSrc: Oral  SpO2: 96%  Weight: 249 lb 12.8 oz (113.3 kg)  Height: 5' 7.5" (1.715 m)    Physical Exam General: alert, oriented, in NAD Head: normocephalic, atraumatic, no sinus tenderness Eyes: EOM intact, no scleral icterus or conjunctival injection Ears: TM clear on right, erythema on left in the ear canal but no rupture Nose: mucosa erythematous,  edematous Throat: no pharyngeal exudate or erythema Lymph: no posterior auricular, submental or cervical lymph adenopathy Heart: normal rate, normal sinus rhythm, no murmurs Lungs: clear to auscultation bilaterally, no wheezing   Assessment and Plan Jahmani was seen today for head congestion.  Diagnoses and all orders for this visit:  Bacterial sinusitis  Acute suppurative otitis media of left ear without spontaneous rupture of tympanic membrane, recurrence not specified  Acute rhinitis  Other orders -     amoxicillin-clavulanate (AUGMENTIN) 875-125 MG tablet; Take 1 tablet by mouth 2 (two) times daily. -     fluticasone (FLONASE) 50 MCG/ACT nasal spray; Place 2 sprays into both nostrils daily.  Added flonase and claritin As well as mucinex Work note given to return to work 09/27/17   Clement SayresZoe A [employer]

## 2017-09-25 NOTE — Patient Instructions (Addendum)
  Use flonase for nasal congestion Take claritin for itchy nose and drainage mucinex expectorant for cough augmentin twice a day for infection  Antibiotic - You have received the antibiotic Augmentin. It may take several days before you feel any benefit from this medicine. Be sure to take all the medication prescribed, even if you are feeling better before it is finished. Do not drink alcohol while taking antibiotics.  It is unknown whether you will develop an adverse reaction (diarrhea, rash, nausea or vomiting) or an allergic reaction (trouble breathing, anaphylaxis).       IF you received an x-ray today, you will receive an invoice from [facility]. Please contact [facility] at [phone] with questions or concerns regarding your invoice.   IF you received labwork today, you will receive an invoice from [facility]. Please contact [facility] at [phone] with questions or concerns regarding your invoice.   Our billing staff will not be able to assist you with questions regarding bills from these companies.  You will be contacted with the lab results as soon as they are available. The fastest way to get your results is to activate your My Chart account. Instructions are located on the last page of this paperwork. If you have not heard from us regarding the results in 2 weeks, please contact this office.

## 2017-11-06 ENCOUNTER — Telehealth: Payer: Self-pay

## 2017-11-06 NOTE — Telephone Encounter (Signed)
Pt is requesting refill on Norco. Please advise.

## 2017-11-08 NOTE — Telephone Encounter (Signed)
At pt's last visit with me on 11/7 he was given paper hydrocodone rxs to fill 11/8, 12/8, and 1/8 - perhaps he put it on file with his pharmacy.  Do for a visit in 1 mo for any additional refills

## 2017-11-09 NOTE — Telephone Encounter (Signed)
Spoke to pharmacist - pt has refilled Norco Nov, Dec and Jan 2019. Spoke to pt to advise to make appt.  He states he does not need meds, will get back with us later and hung up.

## 2017-11-10 ENCOUNTER — Telehealth: Payer: Self-pay | Admitting: Family Medicine

## 2017-11-10 NOTE — Telephone Encounter (Signed)
Copied from CRM [phone]. Topic: Quick Communication - See Telephone Encounter >> Nov 10, 2017  1:24 PM Louie BunPalacios Medina, Rosey Batheresa D wrote: Patient called and would like to talk to Dr. Clelia CroftShaw about a miss communication from last week. Please call patient back, thanks. CRM for notification. See Telephone encounter for: 11/10/17.

## 2017-11-13 NOTE — Telephone Encounter (Signed)
Lm message can we get some additional information on the miss communication is for Dr. Clelia CroftShaw.

## 2017-11-22 ENCOUNTER — Ambulatory Visit (INDEPENDENT_AMBULATORY_CARE_PROVIDER_SITE_OTHER): Payer: Managed Care, Other (non HMO)

## 2017-11-22 ENCOUNTER — Ambulatory Visit (INDEPENDENT_AMBULATORY_CARE_PROVIDER_SITE_OTHER): Payer: Managed Care, Other (non HMO) | Admitting: Family Medicine

## 2017-11-22 ENCOUNTER — Encounter: Payer: Self-pay | Admitting: Family Medicine

## 2017-11-22 VITALS — BP 155/95 | HR 53 | Temp 98.4°F | Resp 18 | Ht 67.5 in | Wt 257.0 lb

## 2017-11-22 DIAGNOSIS — G894 Chronic pain syndrome: Secondary | ICD-10-CM

## 2017-11-22 DIAGNOSIS — M545 Low back pain, unspecified: Secondary | ICD-10-CM

## 2017-11-22 DIAGNOSIS — M546 Pain in thoracic spine: Secondary | ICD-10-CM

## 2017-11-22 DIAGNOSIS — G8929 Other chronic pain: Secondary | ICD-10-CM

## 2017-11-22 DIAGNOSIS — Z5181 Encounter for therapeutic drug level monitoring: Secondary | ICD-10-CM | POA: Diagnosis not present

## 2017-11-22 DIAGNOSIS — F1120 Opioid dependence, uncomplicated: Secondary | ICD-10-CM | POA: Diagnosis not present

## 2017-11-22 MED ORDER — HYDROCODONE-ACETAMINOPHEN 10-325 MG PO TABS: 1.0000 | ORAL_TABLET | ORAL | 0 refills | Status: DC | PRN

## 2017-11-22 MED ORDER — VITAMIN D (ERGOCALCIFEROL) 1.25 MG (50000 UNIT) PO CAPS: 50000.0000 [IU] | ORAL_CAPSULE | ORAL | 0 refills | Status: DC

## 2017-11-22 MED ORDER — NALOXONE HCL 4 MG/0.1ML NA LIQD: 0.4000 mg | Freq: Once | NASAL | 2 refills | Status: AC

## 2017-11-22 NOTE — Patient Instructions (Addendum)
Your last rx was filled on 11/07/2017 - each rx should last 30 days so your next 3 rxs should be due on:  12/07/2017 01/06/2018 02/05/2017 So f/u in April for additional refills but you refills will not begin until 5/8 - so even if you some/one of your above rxs early, they all have to even out to last 30 days.   Your vitamin D level is to low. Start taking a once weekly vitamin D supplement which I sent to your pharmacy. After the prescription is complete in 6 months, start taking 2000-4000u of over the counter vitamin D daily.      IF you received an x-ray today, you will receive an invoice from [facility]. Please contact [facility] at [phone] with questions or concerns regarding your invoice.   IF you received labwork today, you will receive an invoice from [facility]. Please contact [facility] at [phone] with questions or concerns regarding your invoice.   Our billing staff will not be able to assist you with questions regarding bills from these companies.  You will be contacted with the lab results as soon as they are available. The fastest way to get your results is to activate your My Chart account. Instructions are located on the last page of this paperwork. If you have not heard from [place] regarding the results in 2 weeks, please contact this office.     Vitamin D Deficiency Vitamin D deficiency is when your body does not have enough vitamin D. Vitamin D is important to your body for many reasons:  It helps the body to absorb two important minerals, called calcium and phosphorus.  It plays a role in bone health.  It may help to prevent some diseases, such as diabetes and multiple sclerosis.  It plays a role in muscle function, including heart function.  You can get vitamin D by:  Eating foods that naturally contain vitamin D.  Eating or drinking milk or other dairy products that have vitamin D added to them.  Taking a vitamin D supplement or a multivitamin  supplement that contains vitamin D.  Being in the sun. Your body naturally makes vitamin D when your skin is exposed to sunlight. Your body changes the sunlight into a form of the vitamin that the body can use.  If vitamin D deficiency is severe, it can cause a condition in which your bones become soft. In adults, this condition is called osteomalacia. In children, this condition is called rickets. What are the causes? Vitamin D deficiency may be caused by:  Not eating enough foods that contain vitamin D.  Not getting enough sun exposure.  Having certain digestive system diseases that make it difficult for your body to absorb vitamin D. These diseases include Crohn disease, chronic pancreatitis, and cystic fibrosis.  Having a surgery in which a part of the stomach or a part of the small intestine is removed.  Being obese.  Having chronic kidney disease or liver disease.  What increases the risk? This condition is more likely to develop in:  Older people.  People who do not spend much time outdoors.  People who live in a long-term care facility.  People who have had broken bones.  People with weak or thin bones (osteoporosis).  People who have a disease or condition that changes how the body absorbs vitamin D.  People who have dark skin.  People who take certain medicines, such as steroid medicines or certain seizure medicines.  People who are overweight  or obese.  What are the signs or symptoms? In mild cases of vitamin D deficiency, there may not be any symptoms. If the condition is severe, symptoms may include:  Bone pain.  Muscle pain.  Falling often.  Broken bones caused by a minor injury.  How is this diagnosed? This condition is usually diagnosed with a blood test. How is this treated? Treatment for this condition may depend on what caused the condition. Treatment options include:  Taking vitamin D supplements.  Taking a calcium supplement. Your health  care provider will suggest what dose is best for you.  Follow these instructions at home:  Take medicines and supplements only as told by your health care provider.  Eat foods that contain vitamin D. Choices include: ? Fortified dairy products, cereals, or juices. Fortified means that vitamin D has been added to the food. Check the label on the package to be sure. ? Fatty fish, such as salmon or trout. ? Eggs. ? Oysters.  Do not use a tanning bed.  Maintain a healthy weight. Lose weight, if needed.  Keep all follow-up visits as told by your health care provider. This is important. Contact a health care provider if:  Your symptoms do not go away.  You feel like throwing up (nausea) or you throw up (vomit).  You have fewer bowel movements than usual or it is difficult for you to have a bowel movement (constipation). This information is not intended to replace advice given to you by your health care provider. Make sure you discuss any questions you have with your health care provider. Document Released: 01/09/2012 Document Revised: 03/30/2016 Document Reviewed: 03/04/2015 Elsevier Interactive Patient Education  2018 [employer]

## 2017-11-22 NOTE — Progress Notes (Signed)
Subjective:    Patient ID: Troy Obrien, male    DOB: 1950-12-07, 67 y.o.   MRN: [id number] Chief Complaint  Patient presents with  . Medication Refill    hydrocodone    HPI Taking fish oil and asa 81 but no vit D.  Vitamin D def:  Not taking.  Hypogonadism - low testosterone but was drawn in the later afternoon - need to repeat first thing in the morning if consider replacing  Indication for chronic opioid:  Medication and dose:  # pills per month:  Last UDS date:  Pain contract signed (Y/N):  Date narcotic database last reviewed (include red flags):    When he is leavning forward over the sink a spot in his back will cause severe pain unless he is bracing himself.  He also might have reinjured himself by using his amp.   Past Medical History:  Diagnosis Date  . Arthritis   . Glaucoma   . Gout   . Headache(784.0)   . Hyperlipidemia   . Hypertension   . Inguinal hernia   . Rickets, fetal   . Vitamin D deficiency    Past Surgical History:  Procedure Laterality Date  . carpel tunnel     bilateral  . CIRCUMCISION  1972  . INGUINAL HERNIA REPAIR  10/11/2012   Procedure: HERNIA REPAIR INGUINAL ADULT;  Surgeon: Merrie Roof, MD;  Location: [facility];  Service: General;  Laterality: Right;  . INSERTION OF MESH  10/11/2012   Procedure: INSERTION OF MESH;  Surgeon: Merrie Roof, MD;  Location: [facility];  Service: General;  Laterality: Right;  . TONSILLECTOMY  1963   Current Outpatient Medications on File Prior to Visit  Medication Sig Dispense Refill  . amLODipine (NORVASC) 5 MG tablet TAKE 1 TABLET BY MOUTH EVERY DAY 90 tablet 0  . aspirin 81 MG tablet Take 81 mg by mouth daily.    Marland Kitchen doxazosin (CARDURA) 4 MG tablet TAKE 1/2 TABLET BY MOUTH EVERY MORNING AND 1 TABLET DAILY AT BEDTIME 135 tablet 1  . fluticasone (FLONASE) 50 MCG/ACT nasal spray Place 2 sprays into both nostrils daily. 16 g 6  . pravastatin (PRAVACHOL) 20 MG tablet TAKE 1 TABLET (20 MG TOTAL) BY MOUTH AT  BEDTIME. 90 tablet 1  . sildenafil (REVATIO) 20 MG tablet Take 2 tablets (40 mg total) as needed by mouth. 30 minutes prior to activity no more than every 24 hours 30 tablet 3   No current facility-administered medications on file prior to visit.    Allergies  Allergen Reactions  . Laxative [Bisacodyl] Hives  . Cyclobenzaprine Other (See Comments)    Hand twitching/spasms   No family history on file. Social History   Socioeconomic History  . Marital status: Married    Spouse name: None  . Number of children: None  . Years of education: None  . Highest education level: None  Social Needs  . Financial resource strain: None  . Food insecurity - worry: None  . Food insecurity - inability: None  . Transportation needs - medical: None  . Transportation needs - non-medical: None  Occupational History  . None  Tobacco Use  . Smoking status: Never Smoker  . Smokeless tobacco: Never Used  Substance and Sexual Activity  . Alcohol use: Yes    Alcohol/week: 0.0 oz    Comment: rare  . Drug use: No  . Sexual activity: Yes    Birth control/protection: None  Other Topics Concern  .  None  Social History Narrative  . None   Depression screen [facility] 2/9 09/25/2017 09/06/2017 05/31/2017 04/17/2017 03/17/2017  Decreased Interest 0 0 0 0 0  Down, Depressed, Hopeless 0 0 0 0 0  PHQ - 2 Score 0 0 0 0 0     Review of Systems See hpi    Objective:   Physical Exam  Constitutional: He is oriented to person, place, and time. He appears well-developed and well-nourished. No distress.  HENT:  Head: Normocephalic and atraumatic.  Eyes: Conjunctivae are normal. Pupils are equal, round, and reactive to light. No scleral icterus.  Neck: Normal range of motion. Neck supple. No thyromegaly present.  Cardiovascular: Normal rate, regular rhythm, normal heart sounds and intact distal pulses.  Pulmonary/Chest: Effort normal and breath sounds normal. No respiratory distress.  Musculoskeletal: He exhibits  no edema.  Lymphadenopathy:    He has no cervical adenopathy.  Neurological: He is alert and oriented to person, place, and time.  Skin: Skin is warm and dry. He is not diaphoretic.  Psychiatric: He has a normal mood and affect. His behavior is normal.      BP (!) 155/95   Pulse (!) 53   Temp 98.4 F (36.9 C) (Oral)   Resp 18   Ht 5' 7.5" (1.715 m)   Wt 257 lb (116.6 kg)   SpO2 95%   BMI 39.66 kg/m     Dg Thoracic Spine 2 View  Result Date: 11/22/2017 CLINICAL DATA:  Back pain EXAM: THORACIC SPINE 2 VIEWS COMPARISON:  03/15/2014 FINDINGS: There is no evidence of thoracic spine fracture. Alignment is normal. Multiple right-sided and anterior osteophytes of the thoracic spine are redemonstrated some of which may represent diffuse idiopathic skeletal hyperostosis. Intervertebral disc spaces are maintained without significant flattening. Pedicles appear intact. No paravertebral soft tissue abnormalities are apparent. IMPRESSION: Multilevel degenerative right-sided and anterior osteophytes of the thoracic spine compatible with thoracic spondylosis and possibly diffuse idiopathic skeletal hyperostosis. No acute osseous abnormality. Electronically Signed   By: Ashley Royalty M.D.   On: 11/22/2017 18:01   Dg Lumbar Spine Complete  Result Date: 11/22/2017 CLINICAL DATA:  Chronic back pain, currently flared along the left lower lumbar spine. EXAM: LUMBAR SPINE - COMPLETE 4+ VIEW COMPARISON:  11/05/2011 FINDINGS: Normal lumbar segmentation. Redemonstration of multilevel degenerative facet arthropathy with sclerosis and hypertrophy more evident from L3 through S1 but similar in appearance to prior. No acute fracture or suspicious osseous lesions. No pars defects or listhesis. The included lower thoracic and lumbar disc spaces are maintained without significant flattening. IMPRESSION: Chronic multilevel degenerative lumbar facet arthropathy. No acute osseous abnormality. Electronically Signed   By: Ashley Royalty M.D.   On: 11/22/2017 17:59    Assessment & Plan:   1. Chronic pain syndrome   2. Opioid type dependence, continuous ([place])   3. Medication monitoring encounter   4. Chronic left-sided low back pain without sciatica   5. Chronic right-sided thoracic back pain     Orders Placed This Encounter  Procedures  . DG Lumbar Spine Complete    Standing Status:   Future    Number of Occurrences:   1    Standing Expiration Date:   11/22/2018    Order Specific Question:   Reason for Exam (SYMPTOM  OR DIAGNOSIS REQUIRED)    Answer:   chronic pain, currently flaired mid thoracic and low lumbar left side    Order Specific Question:   Preferred imaging location?    Answer:  External  . DG Thoracic Spine 2 View    Standing Status:   Future    Number of Occurrences:   1    Standing Expiration Date:   11/22/2018    Order Specific Question:   Reason for Exam (SYMPTOM  OR DIAGNOSIS REQUIRED)    Answer:   chronic pain, currently flaired mid thoracic and low lumbar left side    Order Specific Question:   Preferred imaging location?    Answer:   External  . ToxASSURE Select 13 (MW), Urine    Meds ordered this encounter  Medications  . DISCONTD: HYDROcodone-acetaminophen (NORCO) 10-325 MG tablet    Sig: Take 1-2 tablets by mouth every 4 (four) hours as needed.    Dispense:  210 tablet    Refill:  0  . DISCONTD: HYDROcodone-acetaminophen (NORCO) 10-325 MG tablet    Sig: Take 1-2 tablets by mouth every 4 (four) hours as needed.    Dispense:  210 tablet    Refill:  0  . HYDROcodone-acetaminophen (NORCO) 10-325 MG tablet    Sig: Take 1-2 tablets by mouth every 4 (four) hours as needed.    Dispense:  210 tablet    Refill:  0  . naloxone (NARCAN) nasal spray 4 mg/0.1 mL    Sig: Place 0.1 sprays (0.4 mg total) into the nose once for 1 dose. If suspected overdose. Call 911 immediately. Repeat as soon as 2 minutes if sxs recur    Dispense:  1 kit    Refill:  2  . Vitamin D, Ergocalciferol,  (DRISDOL) 50000 units CAPS capsule    Sig: Take 1 capsule (50,000 Units total) by mouth every 7 (seven) days.    Dispense:  24 capsule    Refill:  0     Delman Cheadle, M.D.  Primary Care at [facility] [address] [place], [place] [address] [phone] phone [phone] fax  11/25/17 2:56 PM

## 2017-11-26 ENCOUNTER — Other Ambulatory Visit: Payer: Self-pay | Admitting: Family Medicine

## 2017-11-27 NOTE — Telephone Encounter (Signed)
Medication last filled 03/17/17 90 day with 1 refill, pls advise

## 2017-11-30 LAB — TOXASSURE SELECT 13 (MW), URINE

## 2017-12-27 ENCOUNTER — Other Ambulatory Visit: Payer: Self-pay | Admitting: Family Medicine

## 2018-01-08 ENCOUNTER — Telehealth: Payer: Self-pay | Admitting: Family Medicine

## 2018-01-08 NOTE — Telephone Encounter (Signed)
Copied from CRM [id number]. Topic: Quick Communication - See Telephone Encounter >> Jan 08, 2018 10:22 AM Diana EvesHoyt, Maryann B wrote: CRM for notification. See Telephone encounter for:  Pt would like Dr. Clelia CroftShaw to call him. He didn't want to explain what the message was to me. He states he needs to just tell her.  01/08/18.

## 2018-01-24 ENCOUNTER — Other Ambulatory Visit: Payer: Self-pay | Admitting: Family Medicine

## 2018-01-24 NOTE — Telephone Encounter (Signed)
Patient called to schedule an appointment in April per note of Dr. Clelia CroftShaw to F/U around 02/20/18 before filling the amlodipine. Patient says "I can't talk right now, can you call me back with the appointment?" I advised the patient to call the office when he is available to talk, he verbalized understanding.

## 2018-01-24 NOTE — Telephone Encounter (Signed)
Phone call to patient, relayed message from Dr. Clelia CroftShaw. Patient advised RN can take message to relay to provider, but Dr. Clelia CroftShaw will call him if this is what he prefers.   Patient states, "I had to go the doctor, I'm having a lot of dental work done.I had to go to the doctor last week and I wanted to speak with her and I'm on pain medications and I didn't want to do anything that was going to upset my schedule of what I already take. I wanted to make sure I wouldn'tg et any medications that would alter the prescription that I'm already on.  I went through he surgery, and I told him I didn't want to add any pain medicines. He understood that and he didn't write me any medications.   I have another doctor's appointment Monday, it's going to be surgery in my mouth again. I think it's going to go the same way, it's a similar surgery.   She told me to let her know if I'm going to the doctor and if I get any pain medications, and I didn't. I didn't get any new prescriptions from dentist and I do not require any prescriptions from dentist."  Patient ended call.   Provider, Lorain ChildesFYI.

## 2018-01-24 NOTE — Telephone Encounter (Signed)
I was out of the office last week and am trying to play catch up this week so was unable to get back to pt. If pt still needs to speak to me, I will try to squeeze in a call but please let him know that giving my nurse info about this questions/concerns can help us help him much more successfully and efficiently.  If he wants to get a message directly to me, recommend he register for MyChart so he can email his request in.  We can send him a text, email, or letter w/ the activation info should he not have his last AVS still.

## 2018-01-24 NOTE — Telephone Encounter (Signed)
Noted, thank you

## 2018-02-09 ENCOUNTER — Other Ambulatory Visit: Payer: Self-pay | Admitting: Family Medicine

## 2018-02-12 ENCOUNTER — Encounter: Payer: Self-pay | Admitting: Family Medicine

## 2018-02-12 ENCOUNTER — Ambulatory Visit (INDEPENDENT_AMBULATORY_CARE_PROVIDER_SITE_OTHER): Payer: Managed Care, Other (non HMO) | Admitting: Family Medicine

## 2018-02-12 ENCOUNTER — Other Ambulatory Visit: Payer: Self-pay

## 2018-02-12 VITALS — BP 130/68 | HR 72 | Temp 98.2°F | Resp 20 | Ht 67.5 in | Wt 254.0 lb

## 2018-02-12 DIAGNOSIS — N401 Enlarged prostate with lower urinary tract symptoms: Secondary | ICD-10-CM | POA: Diagnosis not present

## 2018-02-12 DIAGNOSIS — E11620 Type 2 diabetes mellitus with diabetic dermatitis: Secondary | ICD-10-CM

## 2018-02-12 DIAGNOSIS — F1120 Opioid dependence, uncomplicated: Secondary | ICD-10-CM

## 2018-02-12 DIAGNOSIS — H409 Unspecified glaucoma: Secondary | ICD-10-CM | POA: Diagnosis not present

## 2018-02-12 DIAGNOSIS — I10 Essential (primary) hypertension: Secondary | ICD-10-CM | POA: Diagnosis not present

## 2018-02-12 DIAGNOSIS — E559 Vitamin D deficiency, unspecified: Secondary | ICD-10-CM

## 2018-02-12 DIAGNOSIS — J343 Hypertrophy of nasal turbinates: Secondary | ICD-10-CM | POA: Diagnosis not present

## 2018-02-12 DIAGNOSIS — J3089 Other allergic rhinitis: Secondary | ICD-10-CM

## 2018-02-12 DIAGNOSIS — G894 Chronic pain syndrome: Secondary | ICD-10-CM | POA: Diagnosis not present

## 2018-02-12 DIAGNOSIS — J328 Other chronic sinusitis: Secondary | ICD-10-CM

## 2018-02-12 DIAGNOSIS — R351 Nocturia: Secondary | ICD-10-CM | POA: Diagnosis not present

## 2018-02-12 MED ORDER — IPRATROPIUM BROMIDE 0.03 % NA SOLN: 2.0000 | Freq: Four times a day (QID) | NASAL | 1 refills | Status: DC

## 2018-02-12 MED ORDER — AMLODIPINE BESYLATE 5 MG PO TABS: ORAL_TABLET | ORAL | 3 refills | Status: DC

## 2018-02-12 MED ORDER — HYDROCODONE-ACETAMINOPHEN 10-325 MG PO TABS
1.0000 | ORAL_TABLET | Freq: Four times a day (QID) | ORAL | 0 refills | Status: DC | PRN
Start: 2018-04-30 — End: 2018-05-14

## 2018-02-12 MED ORDER — HYDROCODONE-ACETAMINOPHEN 10-325 MG PO TABS: 1.0000 | ORAL_TABLET | Freq: Four times a day (QID) | ORAL | 0 refills | Status: DC | PRN

## 2018-02-12 MED ORDER — AZELASTINE HCL 0.1 % NA SOLN: 1.0000 | Freq: Two times a day (BID) | NASAL | 12 refills | Status: DC

## 2018-02-12 MED ORDER — VITAMIN D (ERGOCALCIFEROL) 1.25 MG (50000 UNIT) PO CAPS: 50000.0000 [IU] | ORAL_CAPSULE | ORAL | 0 refills | Status: DC

## 2018-02-12 MED ORDER — DOXAZOSIN MESYLATE 4 MG PO TABS: ORAL_TABLET | ORAL | 1 refills | Status: DC

## 2018-02-12 NOTE — Patient Instructions (Addendum)
  To ensure that your lab results are as accurate as possible, it is important that you come in for your next "lab-only" FASTING blood draw OR to have your blood drawn at your complete physical in 3 months as close to 8 am as possible. If for some reason, forces have conspired against you, and you realize that it is after 9:00 a.m. when you are finally checking in with the front office staff (by letting them know that you are there for a fasting lab-only visit and do not need to see a provider), you might strongly consider re-labeling your morning as a dry run, cancel the lab visit if the clerical team has already completed your registration, and retry for a 8 am arrival to [address] another a.m.  Minimizing activity and time as much as possible and reasonable between rolling out of bed and having your blood drawn is thought to progressively increase the accuracy of results. (Ok, I'll let you brush your teeth AND you can change into clean underwear as long as you try to be lazy about it -e.g. don't work-out that morning or run a.m. errands.)  Do not eat or drink anything after the midnight prior to your blood draw other than water. If you normally take morning medications, vitamins, and/or supplements, it would be ideal to pack a to-go baggie of your a.m. doses the evening prior, which you could then take after the blood draw when you are also allowed your breakfast and coffee.  However, if this is to disruptive to your finely tuned regimen, I imagine that taking a few medications or supplements with sips of water only within 30 minutes of your impending blood draw would not allow enough time or quantity to change your blood levels to a clinically significant degree.     IF you received an x-ray today, you will receive an invoice from [facility]. Please contact [facility] at [phone] with questions or concerns regarding your invoice.   IF you received labwork today, you will  receive an invoice from [facility]. Please contact [facility] at [phone] with questions or concerns regarding your invoice.   Our billing staff will not be able to assist you with questions regarding bills from these companies.  You will be contacted with the lab results as soon as they are available. The fastest way to get your results is to activate your My Chart account. Instructions are located on the last page of this paperwork. If you have not heard from us regarding the results in 2 weeks, please contact this office.

## 2018-02-12 NOTE — Progress Notes (Signed)
By signing my name below, I, Schuyler Bain, attest that this documentation has been prepared under the direction and in the presence of Dr. Levell JulyEva N. Clelia CroftShaw. Electronically Signed: Marcelline MatesSchuyler Bain, Medical Scribe 02/12/2018 at 2:04 PM.  Subjective:    Patient ID: Troy FarrierEddie M Obrien, male    DOB: 1951/06/16, 67 y.o.   MRN: [id number] Chief Complaint  Patient presents with  . Chronic Pain    Pt states chronic pain is the same.  . Follow-up    HPI Troy Obrien is a 67 y.o. male who presents to [facility] at [facility] today for a follow up.  Last scripts were filled [id number] at [facility] [address] Pt is on 70 mme per day.   Pre-Diabetes: Last labs in November 2018 showed A1C at 6.6 which will be checked again in 3 months.  He reports that he continues to take 81mg  Aspirin.   Dental: He notes that he will get partial dentures later this month.   Seasonal Allergies: [name] also notes that his allergies have been bad this year. He continues to take eye drops each day.   Glaucoma: The pt notes that his glaucoma has been unchanging since his last visit, and he notes that his last visual test yielded a finding of 20/20.   Hypertension: The pt notes that his BP has been well controlled. He denies any light headedness and dizziness.   He notes that he continues to get headaches, but that he is tolerating his medications well otherwise.   Indication for chronic opioid: chronic thoracic and low back pain with multilevel degenerative changes including arthropathy, spondylosis, hyperostosis.  Medication and dose: Hypercodone 10-325 # pills per month: 210 per month Last UDS date: 11/22/17 Opioid Treatment Agreement signed (Y/N): Yes, on 10/06/16 available scanned under Media tab.  Opioid Treatment Agreement last reviewed with patient:  Reviewed 11/22/17  NCCSRS reviewed this encounter (include red flags):  Reviewed today, 02/12/18.   Patient Active Problem List   Diagnosis Date Noted  .  Nasal turbinate hypertrophy 09/05/2017  . Chronic left-sided low back pain without sciatica 09/05/2017  . Chronic pain syndrome 04/19/2017  . Opioid type dependence, continuous (HCC) 04/19/2017  . Hyperlipidemia 05/02/2016  . Hypersomnia with sleep apnea 02/17/2016  . Snoring 02/17/2016  . Nocturia more than twice per night 02/17/2016  . Nasal obstruction 02/17/2016  . Sleep related headaches 02/17/2016  . Morbid obesity due to excess calories (HCC) 02/17/2016  . Vitamin D deficiency 02/04/2015  . Other chronic sinusitis 02/04/2015  . Rhinitis, allergic 02/04/2015  . BPH associated with nocturia 05/10/2014  . Polypharmacy 04/13/2014  . Raynaud phenomenon 04/13/2014  . Erectile dysfunction 04/13/2014  . Type 2 diabetes mellitus (HCC) 04/13/2014  . Chronic right-sided thoracic back pain 03/26/2014  . Right groin pain 02/20/2013  . Arthritis of hand 07/08/2012  . Fatigue 07/08/2012  . Sleep apnea 07/08/2012  . Hernia 07/08/2012  . Inguinal hernia, right 03/30/2012  . Right upper quadrant pain 03/30/2012  . Back pain 03/30/2012  . Hypertension 02/02/2012  . Glaucoma 02/02/2012   Past Medical History:  Diagnosis Date  . Arthritis   . Glaucoma   . Gout   . Headache(784.0)   . Hyperlipidemia   . Hypertension   . Inguinal hernia   . Rickets, fetal   . Vitamin D deficiency    Past Surgical History:  Procedure Laterality Date  . carpel tunnel     bilateral  . CIRCUMCISION  1972  . INGUINAL HERNIA  REPAIR  10/11/2012   Procedure: HERNIA REPAIR INGUINAL ADULT;  Surgeon: Robyne Askew, MD;  Location: [facility] OR;  Service: General;  Laterality: Right;  . INSERTION OF MESH  10/11/2012   Procedure: INSERTION OF MESH;  Surgeon: Robyne Askew, MD;  Location: [facility] OR;  Service: General;  Laterality: Right;  . TONSILLECTOMY  1963   Allergies  Allergen Reactions  . Laxative [Bisacodyl] Hives  . Cyclobenzaprine Other (See Comments)    Hand twitching/spasms   Prior to Admission  medications   Medication Sig Start Date End Date Taking? Authorizing Provider  amLODipine (NORVASC) 5 MG tablet TAKE 1 TABLET BY MOUTH DAILY *NEED OFFICE VISIT* 02/09/18  Yes Sherren Mocha, MD  aspirin 81 MG tablet Take 81 mg by mouth daily.   Yes [provider]  doxazosin (CARDURA) 4 MG tablet TAKE 1/2 TABLET BY MOUTH EVERY MORNING AND 1 TABLET DAILY AT BEDTIME 03/17/17  Yes Sherren Mocha, MD  fluticasone [facility]) 50 MCG/ACT nasal spray Place 2 sprays into both nostrils daily. 09/25/17  Yes Doristine Bosworth, MD  HYDROcodone-acetaminophen (NORCO) 10-325 MG tablet Take 1-2 tablets by mouth every 4 (four) hours as needed. 01/31/18  Yes Sherren Mocha, MD  pravastatin (PRAVACHOL) 20 MG tablet TAKE 1 TABLET BY MOUTH EVERY DAY AT BEDTIME 11/27/17  Yes Sherren Mocha, MD  sildenafil (REVATIO) 20 MG tablet Take 2 tablets (40 mg total) as needed by mouth. 30 minutes prior to activity no more than every 24 hours 09/06/17  Yes Sherren Mocha, MD  Vitamin D, Ergocalciferol, (DRISDOL) 50000 units CAPS capsule Take 1 capsule (50,000 Units total) by mouth every 7 (seven) days. Patient not taking: Reported on 02/12/2018 11/22/17   Sherren Mocha, MD   Social History   Socioeconomic History  . Marital status: Married    Spouse name: Not on file  . Number of children: Not on file  . Years of education: Not on file  . Highest education level: Not on file  Occupational History  . Not on file  Social Needs  . Financial resource strain: Not on file  . Food insecurity:    Worry: Not on file    Inability: Not on file  . Transportation needs:    Medical: Not on file    Non-medical: Not on file  Tobacco Use  . Smoking status: Never Smoker  . Smokeless tobacco: Never Used  Substance and Sexual Activity  . Alcohol use: Yes    Alcohol/week: 0.0 oz    Comment: rare  . Drug use: No  . Sexual activity: Yes    Birth control/protection: None  Lifestyle  . Physical activity:    Days per week: Not on file    Minutes per  session: Not on file  . Stress: Not on file  Relationships  . Social connections:    Talks on phone: Not on file    Gets together: Not on file    Attends religious service: Not on file    Active member of club or organization: Not on file    Attends meetings of clubs or organizations: Not on file    Relationship status: Not on file  . Intimate partner violence:    Fear of current or ex partner: Not on file    Emotionally abused: Not on file    Physically abused: Not on file    Forced sexual activity: Not on file  Other Topics Concern  . Not on file  Social History Narrative  . Not on file    Review of Systems  Constitutional: Negative for chills and fever.  HENT: Positive for dental problem.   Eyes: Negative for visual disturbance.  Musculoskeletal: Positive for back pain.  Allergic/Immunologic: Positive for environmental allergies.  Neurological: Positive for headaches. Negative for dizziness and light-headedness.       Objective:   Physical Exam  Constitutional: He is oriented to person, place, and time. He appears well-developed and well-nourished.  HENT:  Head: Normocephalic and atraumatic.  Cardiovascular: Normal rate and regular rhythm.  Pulmonary/Chest: Effort normal and breath sounds normal. No respiratory distress. He has no wheezes. He has no rales.  Neurological: He is alert and oriented to person, place, and time.  Skin: Skin is warm and dry.  Psychiatric: He has a normal mood and affect. His behavior is normal.   Vitals:   02/12/18 1354  BP: 130/68  Pulse: 72  Resp: 20  Temp: 98.2 F (36.8 C)  TempSrc: Oral  SpO2: 96%  Weight: 254 lb (115.2 kg)  Height: 5' 7.5" (1.715 m)          Results for orders placed or performed in visit on 11/22/17  ToxASSURE Select 13 (MW), Urine  Result Value Ref Range   Summary FINAL    No results found. No results found.  Assessment & Plan:  Today I have utilized the [place] Controlled Substance Registry's online  query to confirm compliance regarding the patient's controlled medications. My review reveals appropriate prescription fills and that I am the sole provider of these medications. Rechecks will occur regularly and the patient is aware of our use of the system.  Failed loratadine, Xyzal, Singulair, Nasacort, Flonase.   Will needs labs for CPE at next visit.   1. Chronic pain syndrome   2. Opioid type dependence, continuous (HCC)   3. Nasal turbinate hypertrophy   4. Non-seasonal allergic rhinitis, unspecified trigger   5. Other chronic sinusitis   6. Glaucoma of both eyes, unspecified glaucoma type   7. Vitamin D deficiency   8. Essential hypertension   9. BPH associated with nocturia   10. Type 2 diabetes mellitus with diabetic dermatitis, without long-term current use of insulin (HCC)      Meds ordered this encounter  Medications  . ipratropium (ATROVENT) 0.03 % nasal spray    Sig: Place 2 sprays into the nose 4 (four) times daily.    Dispense:  30 mL    Refill:  1  . Vitamin D, Ergocalciferol, (DRISDOL) 50000 units CAPS capsule    Sig: Take 1 capsule (50,000 Units total) by mouth every 7 (seven) days.    Dispense:  24 capsule    Refill:  0  . azelastine (ASTELIN) 0.1 % nasal spray    Sig: Place 1 spray into both nostrils 2 (two) times daily. Use in each nostril as directed    Dispense:  30 mL    Refill:  12  . amLODipine (NORVASC) 5 MG tablet    Sig: TAKE 1 TABLET BY MOUTH DAILY    Dispense:  90 tablet    Refill:  3  . HYDROcodone-acetaminophen (NORCO) 10-325 MG tablet    Sig: Take 1-2 tablets by mouth every 6 (six) hours as needed.    Dispense:  210 tablet    Refill:  0  . doxazosin (CARDURA) 4 MG tablet    Sig: TAKE 1/2 TABLET BY MOUTH EVERY MORNING AND 1 TABLET DAILY AT BEDTIME  Dispense:  135 tablet    Refill:  1  . HYDROcodone-acetaminophen (NORCO) 10-325 MG tablet    Sig: Take 1-2 tablets by mouth every 6 (six) hours as needed for moderate pain.    Dispense:   210 tablet    Refill:  0    Chronic Pain Syndrome  . HYDROcodone-acetaminophen (NORCO) 10-325 MG tablet    Sig: Take 1-2 tablets by mouth every 6 (six) hours as needed for moderate pain.    Dispense:  210 tablet    Refill:  0    I personally performed the services described in this documentation, which was scribed in my presence. The recorded information has been reviewed and considered, and addended by me as needed.   [name], M.D.  Primary Care at [facility] [address] [place], [place] [address] [phone] phone [phone] fax  05/14/18 2:49 PM

## 2018-05-14 ENCOUNTER — Other Ambulatory Visit: Payer: Self-pay

## 2018-05-14 ENCOUNTER — Ambulatory Visit (INDEPENDENT_AMBULATORY_CARE_PROVIDER_SITE_OTHER): Payer: Medicare Other | Admitting: Family Medicine

## 2018-05-14 VITALS — BP 148/84 | HR 60 | Temp 97.6°F | Resp 16 | Ht 67.0 in | Wt 255.0 lb

## 2018-05-14 DIAGNOSIS — G4710 Hypersomnia, unspecified: Secondary | ICD-10-CM

## 2018-05-14 DIAGNOSIS — N529 Male erectile dysfunction, unspecified: Secondary | ICD-10-CM

## 2018-05-14 DIAGNOSIS — E11620 Type 2 diabetes mellitus with diabetic dermatitis: Secondary | ICD-10-CM | POA: Diagnosis not present

## 2018-05-14 DIAGNOSIS — Z23 Encounter for immunization: Secondary | ICD-10-CM | POA: Diagnosis not present

## 2018-05-14 DIAGNOSIS — G4730 Sleep apnea, unspecified: Secondary | ICD-10-CM

## 2018-05-14 DIAGNOSIS — Z0000 Encounter for general adult medical examination without abnormal findings: Secondary | ICD-10-CM

## 2018-05-14 DIAGNOSIS — Z1389 Encounter for screening for other disorder: Secondary | ICD-10-CM

## 2018-05-14 DIAGNOSIS — F1120 Opioid dependence, uncomplicated: Secondary | ICD-10-CM

## 2018-05-14 DIAGNOSIS — R351 Nocturia: Secondary | ICD-10-CM

## 2018-05-14 DIAGNOSIS — Z1383 Encounter for screening for respiratory disorder NEC: Secondary | ICD-10-CM

## 2018-05-14 DIAGNOSIS — R7989 Other specified abnormal findings of blood chemistry: Secondary | ICD-10-CM

## 2018-05-14 DIAGNOSIS — I10 Essential (primary) hypertension: Secondary | ICD-10-CM

## 2018-05-14 DIAGNOSIS — N401 Enlarged prostate with lower urinary tract symptoms: Secondary | ICD-10-CM

## 2018-05-14 DIAGNOSIS — Z136 Encounter for screening for cardiovascular disorders: Secondary | ICD-10-CM

## 2018-05-14 DIAGNOSIS — Z0001 Encounter for general adult medical examination with abnormal findings: Secondary | ICD-10-CM | POA: Diagnosis not present

## 2018-05-14 DIAGNOSIS — Z79899 Other long term (current) drug therapy: Secondary | ICD-10-CM

## 2018-05-14 DIAGNOSIS — Z125 Encounter for screening for malignant neoplasm of prostate: Secondary | ICD-10-CM | POA: Diagnosis not present

## 2018-05-14 DIAGNOSIS — E782 Mixed hyperlipidemia: Secondary | ICD-10-CM

## 2018-05-14 DIAGNOSIS — E559 Vitamin D deficiency, unspecified: Secondary | ICD-10-CM

## 2018-05-14 DIAGNOSIS — J343 Hypertrophy of nasal turbinates: Secondary | ICD-10-CM

## 2018-05-14 DIAGNOSIS — G894 Chronic pain syndrome: Secondary | ICD-10-CM

## 2018-05-14 LAB — POCT GLYCOSYLATED HEMOGLOBIN (HGB A1C): Hemoglobin A1C: 6.1 % — AB (ref 4.0–5.6)

## 2018-05-14 LAB — POCT URINALYSIS DIP (MANUAL ENTRY)
BILIRUBIN UA: NEGATIVE
GLUCOSE UA: NEGATIVE mg/dL
Ketones, POC UA: NEGATIVE mg/dL
Leukocytes, UA: NEGATIVE
NITRITE UA: NEGATIVE
PH UA: 7 (ref 5.0–8.0)
Protein Ur, POC: NEGATIVE mg/dL
SPEC GRAV UA: 1.01 (ref 1.010–1.025)
UROBILINOGEN UA: 0.2 U/dL

## 2018-05-14 MED ORDER — PRAVASTATIN SODIUM 20 MG PO TABS: ORAL_TABLET | ORAL | 1 refills | Status: DC

## 2018-05-14 MED ORDER — HYDROCODONE-ACETAMINOPHEN 10-325 MG PO TABS: 1.0000 | ORAL_TABLET | Freq: Four times a day (QID) | ORAL | 0 refills | Status: DC | PRN

## 2018-05-14 MED ORDER — AMLODIPINE-OLMESARTAN 5-20 MG PO TABS: 1.0000 | ORAL_TABLET | Freq: Every day | ORAL | 1 refills | Status: DC

## 2018-05-14 MED ORDER — SILDENAFIL CITRATE 20 MG PO TABS: 40.0000 mg | ORAL_TABLET | ORAL | 3 refills | Status: DC | PRN

## 2018-05-14 MED ORDER — ZOSTER VAC RECOMB ADJUVANTED 50 MCG/0.5ML IM SUSR: 0.5000 mL | Freq: Once | INTRAMUSCULAR | 0 refills | Status: DC

## 2018-05-14 MED ORDER — ZOSTER VAC RECOMB ADJUVANTED 50 MCG/0.5ML IM SUSR: 0.5000 mL | Freq: Once | INTRAMUSCULAR | 1 refills | Status: AC

## 2018-05-14 NOTE — Patient Instructions (Addendum)
   IF you received an x-ray today, you will receive an invoice from [facility]. Please contact [facility] at [phone] with questions or concerns regarding your invoice.   IF you received labwork today, you will receive an invoice from [facility]. Please contact [facility] at [phone] with questions or concerns regarding your invoice.   Our billing staff will not be able to assist you with questions regarding bills from these companies.  You will be contacted with the lab results as soon as they are available. The fastest way to get your results is to activate your My Chart account. Instructions are located on the last page of this paperwork. If you have not heard from us regarding the results in 2 weeks, please contact this office.     DASH Eating Plan DASH stands for "Dietary Approaches to Stop Hypertension." The DASH eating plan is a healthy eating plan that has been shown to reduce high blood pressure (hypertension). It may also reduce your risk for type 2 diabetes, heart disease, and stroke. The DASH eating plan may also help with weight loss. What are tips for following this plan? General guidelines   Avoid eating more than 2,300 mg (milligrams) of salt (sodium) a day. If you have hypertension, you may need to reduce your sodium intake to 1,500 mg a day.  Limit alcohol intake to no more than 1 drink a day for nonpregnant women and 2 drinks a day for men. One drink equals 12 oz of beer, 5 oz of wine, or 1 oz of hard liquor.  Work with your health care provider to maintain a healthy body weight or to lose weight. Ask what an ideal weight is for you.  Get at least 30 minutes of exercise that causes your heart to beat faster (aerobic exercise) most days of the week. Activities may include walking, swimming, or biking.  Work with your health care provider or diet and nutrition specialist (dietitian) to adjust your eating plan to your individual calorie  needs. Reading food labels   Check food labels for the amount of sodium per serving. Choose foods with less than 5 percent of the Daily Value of sodium. Generally, foods with less than 300 mg of sodium per serving fit into this eating plan.  To find whole grains, look for the word "whole" as the first word in the ingredient list. Shopping   Buy products labeled as "low-sodium" or "no salt added."  Buy fresh foods. Avoid canned foods and premade or frozen meals. Cooking   Avoid adding salt when cooking. Use salt-free seasonings or herbs instead of table salt or sea salt. Check with your health care provider or pharmacist before using salt substitutes.  Do not fry foods. Cook foods using healthy methods such as baking, boiling, grilling, and broiling instead.  Cook with heart-healthy oils, such as olive, canola, soybean, or sunflower oil. Meal planning    Eat a balanced diet that includes:  5 or more servings of fruits and vegetables each day. At each meal, try to fill half of your plate with fruits and vegetables.  Up to 6-8 servings of whole grains each day.  Less than 6 oz of lean meat, poultry, or fish each day. A 3-oz serving of meat is about the same size as a deck of cards. One egg equals 1 oz.  2 servings of low-fat dairy each day.  A serving of nuts, seeds, or beans 5 times each week.  Heart-healthy fats. Healthy fats called   found in foods such as flaxseeds and coldwater fish, like sardines, salmon, and mackerel.  Limit how much you eat of the following: ? Canned or prepackaged foods. ? Food that is high in trans fat, such as fried foods. ? Food that is high in saturated fat, such as fatty meat. ? Sweets, desserts, sugary drinks, and other foods with added sugar. ? Full-fat dairy products.  Do not salt foods before eating.  Try to eat at least 2 vegetarian meals each week.  Eat more home-cooked food and less restaurant, buffet, and fast  food.  When eating at a restaurant, ask that your food be prepared with less salt or no salt, if possible. What foods are recommended? The items listed may not be a complete list. Talk with your dietitian about what dietary choices are best for you. Grains Whole-grain or whole-wheat bread. Whole-grain or whole-wheat pasta. Brown rice. Oatmeal. Quinoa. Bulgur. Whole-grain and low-sodium cereals. Pita bread. Low-fat, low-sodium crackers. Whole-wheat flour tortillas. Vegetables Fresh or frozen vegetables (raw, steamed, roasted, or grilled). Low-sodium or reduced-sodium tomato and vegetable juice. Low-sodium or reduced-sodium tomato sauce and tomato paste. Low-sodium or reduced-sodium canned vegetables. Fruits All fresh, dried, or frozen fruit. Canned fruit in natural juice (without added sugar). Meat and other protein foods Skinless chicken or turkey. Ground chicken or turkey. Pork with fat trimmed off. Fish and seafood. Egg whites. Dried beans, peas, or lentils. Unsalted nuts, nut butters, and seeds. Unsalted canned beans. Lean cuts of beef with fat trimmed off. Low-sodium, lean deli meat. Dairy Low-fat (1%) or fat-free (skim) milk. Fat-free, low-fat, or reduced-fat cheeses. Nonfat, low-sodium ricotta or cottage cheese. Low-fat or nonfat yogurt. Low-fat, low-sodium cheese. Fats and oils Soft margarine without trans fats. Vegetable oil. Low-fat, reduced-fat, or light mayonnaise and salad dressings (reduced-sodium). Canola, safflower, olive, soybean, and sunflower oils. Avocado. Seasoning and other foods Herbs. Spices. Seasoning mixes without salt. Unsalted popcorn and pretzels. Fat-free sweets. What foods are not recommended? The items listed may not be a complete list. Talk with your dietitian about what dietary choices are best for you. Grains Baked goods made with fat, such as croissants, muffins, or some breads. Dry pasta or rice meal packs. Vegetables Creamed or fried vegetables. Vegetables  in a cheese sauce. Regular canned vegetables (not low-sodium or reduced-sodium). Regular canned tomato sauce and paste (not low-sodium or reduced-sodium). Regular tomato and vegetable juice (not low-sodium or reduced-sodium). Pickles. Olives. Fruits Canned fruit in a light or heavy syrup. Fried fruit. Fruit in cream or butter sauce. Meat and other protein foods Fatty cuts of meat. Ribs. Fried meat. Bacon. Sausage. Bologna and other processed lunch meats. Salami. Fatback. Hotdogs. Bratwurst. Salted nuts and seeds. Canned beans with added salt. Canned or smoked fish. Whole eggs or egg yolks. Chicken or turkey with skin. Dairy Whole or 2% milk, cream, and half-and-half. Whole or full-fat cream cheese. Whole-fat or sweetened yogurt. Full-fat cheese. Nondairy creamers. Whipped toppings. Processed cheese and cheese spreads. Fats and oils Butter. Stick margarine. Lard. Shortening. Ghee. Bacon fat. Tropical oils, such as coconut, palm kernel, or palm oil. Seasoning and other foods Salted popcorn and pretzels. Onion salt, garlic salt, seasoned salt, table salt, and sea salt. Worcestershire sauce. Tartar sauce. Barbecue sauce. Teriyaki sauce. Soy sauce, including reduced-sodium. Steak sauce. Canned and packaged gravies. Fish sauce. Oyster sauce. Cocktail sauce. Horseradish that you find on the shelf. Ketchup. Mustard. Meat flavorings and tenderizers. Bouillon cubes. Hot sauce and Tabasco sauce. Premade or packaged marinades. Premade or packaged taco seasonings.   Relishes. Regular salad dressings. Where to find more information:  National Heart, Lung, and Blood Institute: PopSteam.iswww.nhlbi.nih.gov  American Heart Association: www.heart.org Summary  The DASH eating plan is a healthy eating plan that has been shown to reduce high blood pressure (hypertension). It may also reduce your risk for type 2 diabetes, heart disease, and stroke.  With the DASH eating plan, you should limit salt (sodium) intake to 2,300 mg a  day. If you have hypertension, you may need to reduce your sodium intake to 1,500 mg a day.  When on the DASH eating plan, aim to eat more fresh fruits and vegetables, whole grains, lean proteins, low-fat dairy, and heart-healthy fats.  Work with your health care provider or diet and nutrition specialist (dietitian) to adjust your eating plan to your individual calorie needs. This information is not intended to replace advice given to you by your health care provider. Make sure you discuss any questions you have with your health care provider. Document Released: 10/06/2011 Document Revised: 10/10/2016 Document Reviewed: 10/10/2016 Elsevier Interactive Patient Education  2018 [employer]  Health Maintenance, Male A healthy lifestyle and preventive care is important for your health and wellness. Ask your health care provider about what schedule of regular examinations is right for you. What should I know about weight and diet? Eat a Healthy Diet  Eat plenty of vegetables, fruits, whole grains, low-fat dairy products, and lean protein.  Do not eat a lot of foods high in solid fats, added sugars, or salt.  Maintain a Healthy Weight Regular exercise can help you achieve or maintain a healthy weight. You should:  Do at least 150 minutes of exercise each week. The exercise should increase your heart rate and make you sweat (moderate-intensity exercise).  Do strength-training exercises at least twice a week.  Watch Your Levels of Cholesterol and Blood Lipids  Have your blood tested for lipids and cholesterol every 5 years starting at 65 years of age. If you are at high risk for heart disease, you should start having your blood tested when you are 65 years old. You may need to have your cholesterol levels checked more often if: ? Your lipid or cholesterol levels are high. ? You are older than 65 years of age. ? You are at high risk for heart disease.  What should I know about cancer  screening? Many types of cancers can be detected early and may often be prevented. Lung Cancer  You should be screened every year for lung cancer if: ? You are a current smoker who has smoked for at least 30 years. ? You are a former smoker who has quit within the past 15 years.  Talk to your health care provider about your screening options, when you should start screening, and how often you should be screened.  Colorectal Cancer  Routine colorectal cancer screening usually begins at 65 years of age and should be repeated every 5-10 years until you are 65 years old. You may need to be screened more often if early forms of precancerous polyps or small growths are found. Your health care provider may recommend screening at an earlier age if you have risk factors for colon cancer.  Your health care provider may recommend using home test kits to check for hidden blood in the stool.  A small camera at the end of a tube can be used to examine your colon (sigmoidoscopy or colonoscopy). This checks for the earliest forms of colorectal cancer.  Prostate and Testicular  Cancer  Depending on your age and overall health, your health care provider may do certain tests to screen for prostate and testicular cancer.  Talk to your health care provider about any symptoms or concerns you have about testicular or prostate cancer.  Skin Cancer  Check your skin from head to toe regularly.  Tell your health care provider about any new moles or changes in moles, especially if: ? There is a change in a mole's size, shape, or color. ? You have a mole that is larger than a pencil eraser.  Always use sunscreen. Apply sunscreen liberally and repeat throughout the day.  Protect yourself by wearing long sleeves, pants, a wide-brimmed hat, and sunglasses when outside.  What should I know about heart disease, diabetes, and high blood pressure?  If you are 7-40 years of age, have your blood pressure checked  every 3-5 years. If you are 76 years of age or older, have your blood pressure checked every year. You should have your blood pressure measured twice-once when you are at a hospital or clinic, and once when you are not at a hospital or clinic. Record the average of the two measurements. To check your blood pressure when you are not at a hospital or clinic, you can use: ? An automated blood pressure machine at a pharmacy. ? A home blood pressure monitor.  Talk to your health care provider about your target blood pressure.  If you are between 70-80 years old, ask your health care provider if you should take aspirin to prevent heart disease.  Have regular diabetes screenings by checking your fasting blood sugar level. ? If you are at a normal weight and have a low risk for diabetes, have this test once every three years after the age of 1. ? If you are overweight and have a high risk for diabetes, consider being tested at a younger age or more often.  A one-time screening for abdominal aortic aneurysm (AAA) by ultrasound is recommended for men aged 65-75 years who are current or former smokers. What should I know about preventing infection? Hepatitis B If you have a higher risk for hepatitis B, you should be screened for this virus. Talk with your health care provider to find out if you are at risk for hepatitis B infection. Hepatitis C Blood testing is recommended for:  Everyone born from [birth year] through 1963.  Anyone with known risk factors for hepatitis C.  Sexually Transmitted Diseases (STDs)  You should be screened each year for STDs including gonorrhea and chlamydia if: ? You are sexually active and are younger than 65 years of age. ? You are older than 65 years of age and your health care provider tells you that you are at risk for this type of infection. ? Your sexual activity has changed since you were last screened and you are at an increased risk for chlamydia or gonorrhea. Ask your  health care provider if you are at risk.  Talk with your health care provider about whether you are at high risk of being infected with HIV. Your health care provider may recommend a prescription medicine to help prevent HIV infection.  What else can I do?  Schedule regular health, dental, and eye exams.  Stay current with your vaccines (immunizations).  Do not use any tobacco products, such as cigarettes, chewing tobacco, and e-cigarettes. If you need help quitting, ask your health care provider.  Limit alcohol intake to no more than 2 drinks per day.  One drink equals 12 ounces of beer, 5 ounces of wine, or 1 ounces of hard liquor.  Do not use street drugs.  Do not share needles.  Ask your health care provider for help if you need support or information about quitting drugs.  Tell your health care provider if you often feel depressed.  Tell your health care provider if you have ever been abused or do not feel safe at home. This information is not intended to replace advice given to you by your health care provider. Make sure you discuss any questions you have with your health care provider. Document Released: 04/14/2008 Document Revised: 06/15/2016 Document Reviewed: 07/21/2015 Elsevier Interactive Patient Education  [employer].

## 2018-05-14 NOTE — Progress Notes (Deleted)
By signing my name below, I, Troy Obrien, attest that this documentation has been prepared under the directions and in the presence of Dr. Levell JulyEva N. Clelia CroftShaw. Electronically Signed: Greer EeAshley Obrien, Medical Scribe 05/14/18 at 3:20pm.  Subjective:   Patient ID: Troy Obrien is a 67 y.o. male.  Chief Complaint:  Chief Complaint  Patient presents with  . Annual Exam     Troy Obrien is a 67 y.o. male who [facility] annual exam. Last cp was in 2015. He is hydrocodone 10#210 per month, last filled  4/3, 5/1,3/1,6/1,7/1, same  pharmacy no other providers. Has been on this regime since 03/18/2017, and is very aware that this is the maximum amount of this medication. He has had appropriate drug and urine screen on 02/12/18.  He reports no new changes with his nose, and still having trouble sleeping. Reports difficulty breathing and responding well to Afrin. He reports going to an allergist a couple years ago. And wanting to receive further treatment for nose to potentially breathing normal. Patients wife asked about CBC oils for Mr. Troy Obrien. Blood pressure was elevated and there is some swelling in ankles, patient reports that he does consume a lot of salt.   The history is provided by the patient and the spouse.    Primary Preventative Screenings: Prostate Cancer: STI screening:  Colorectal Cancer: Tobacco use/AAA/Lung/EtOH/Illicit substances: Patient reports that he does not smoke. Cardiac:  Weight/Blood sugar/Diet/Exercise: BMI Readings from Last 3 Encounters:  05/14/18 39.78 kg/m  02/12/18 39.19 kg/m  11/22/17 39.66 kg/m   Lab Results  Component Value Date   HGBA1C 6.6 09/06/2017   OTC/Vit/Supp/Herbal: Dentist/Optho: Immunizations:  Immunization History  Administered Date(s) Administered  . Influenza Inj Mdck Quad Pf 08/16/2017  . Influenza-Unspecified 09/22/2016, 07/31/2017  . Tdap 03/26/2014    Chronic Medical Conditions:   Past Medical  History:  Diagnosis Date  . Arthritis   . Glaucoma   . Gout   . Headache(784.0)   . Hyperlipidemia   . Hypertension   . Inguinal hernia   . Rickets, fetal   . Vitamin D deficiency     Past Surgical History:  Procedure Laterality Date  . carpel tunnel     bilateral  . CIRCUMCISION  1972  . INGUINAL HERNIA REPAIR  10/11/2012   Procedure: HERNIA REPAIR INGUINAL ADULT;  Surgeon: Robyne AskewPaul S Toth III, MD;  [facility];  Service: General;  Laterality: Right;  . INSERTION OF MESH  10/11/2012   Procedure: INSERTION OF MESH;  Surgeon: Robyne AskewPaul S Toth III, MD;  [facility];  Service: General;  Laterality: Right;  . TONSILLECTOMY  1963    No family history on file.  Social History   Tobacco Use  . Smoking status: Never Smoker  . Smokeless tobacco: Never Used  Substance Use Topics  . Alcohol use: Yes    Alcohol/week: 0.0 oz    Comment: rare  . Drug use: No    Review of Systems  Constitutional: Positive for fatigue and unexpected weight change. Negative for chills.  HENT: Negative.   Eyes: Negative.   Respiratory: Negative.   Cardiovascular: Negative.   Endocrine: Negative.   Musculoskeletal: Negative.   Skin: Negative.   Hematological: Negative.   Psychiatric/Behavioral: Negative.     Objective:   BP (!) 148/84   Pulse 60   Temp 97.6 F (36.4 C) (Oral)   Resp 16   Ht 5\' 7"  (1.702 m)   Wt 255 lb (  115.7 kg)   SpO2 98%   BMI 39.94 kg/m   Physical Exam  Constitutional: He is oriented to person, place, and time. He appears well-developed and well-nourished. No distress.  HENT:  Right Ear: Tympanic membrane normal.  Left Ear: Tympanic membrane normal.  Nose: Nose normal.  Mouth/Throat: Oropharynx is clear and moist.  Eyes: Right eye exhibits no discharge. Left eye exhibits no discharge.  Neck: Normal range of motion. No thyroid mass and no thyromegaly present.  Cardiovascular: Regular rhythm.  Pulmonary/Chest: Effort normal.  Difficult to ascultate.    Abdominal: Soft. Bowel sounds are normal. He exhibits no mass. There is no hepatosplenomegaly or splenomegaly.  Musculoskeletal: Normal range of motion.       Right foot: There is swelling.       Left foot: There is swelling.  1+ bilaterally.  Neurological: He is alert and oriented to person, place, and time.  Skin: Skin is warm, dry and intact.  Psychiatric: He has a normal mood and affect. His behavior is normal. Judgment and thought content normal.  Patient reported that diet consists of high salt intake.   Assessment:   Procedures  The primary encounter diagnosis was Annual physical exam. Diagnoses of Screening for cardiovascular, respiratory, and genitourinary diseases, Screening for prostate cancer, Essential hypertension, Type 2 diabetes mellitus with diabetic dermatitis, without long-term current use of insulin (HCC), Erectile dysfunction, unspecified erectile dysfunction type, BPH associated with nocturia, Polypharmacy, Vitamin D deficiency, Nasal turbinate hypertrophy, Hypersomnia with sleep apnea, Morbid obesity due to excess calories (HCC), Mixed hyperlipidemia, Chronic pain syndrome, and Opioid type dependence, continuous (HCC) were also pertinent to this visit.  Plan:   Plan for welcome to Medicare at next visit with EKG.

## 2018-05-14 NOTE — Progress Notes (Deleted)
Primary Preventative Screenings: Prostate Cancer: STI screening:  Colorectal Cancer: Tobacco use/AAA/Lung/EtOH/Illicit substances:  Cardiac: Weight/Blood sugar/Diet/Exercise: BMI Readings from Last 3 Encounters:  02/12/18 39.19 kg/m  11/22/17 39.66 kg/m  09/25/17 38.55 kg/m   Lab Results  Component Value Date   HGBA1C 6.6 09/06/2017   OTC/Vit/Supp/Herbal: Dentist/Optho: Immunizations:  Immunization History  Administered Date(s) Administered  . Influenza Inj Mdck Quad Pf 08/16/2017  . Influenza-Unspecified 09/22/2016, 07/31/2017  . Tdap 03/26/2014    Chronic Medical Conditions:      Past Medical History:  Diagnosis Date  . Arthritis   . Glaucoma   . Gout   . Headache(784.0)   . Hyperlipidemia   . Hypertension   . Inguinal hernia   . Rickets, fetal   . Vitamin D deficiency    Past Surgical History:  Procedure Laterality Date  . carpel tunnel     bilateral  . CIRCUMCISION  1972  . INGUINAL HERNIA REPAIR  10/11/2012   Procedure: HERNIA REPAIR INGUINAL ADULT;  Surgeon: Robyne AskewPaul S Toth III, MD;  [facility];  Service: General;  Laterality: Right;  . INSERTION OF MESH  10/11/2012   Procedure: INSERTION OF MESH;  Surgeon: Robyne AskewPaul S Toth III, MD;  [facility];  Service: General;  Laterality: Right;  . TONSILLECTOMY  1963   Current Outpatient Medications on File Prior to Visit  Medication Sig Dispense Refill  . amLODipine (NORVASC) 5 MG tablet TAKE 1 TABLET BY MOUTH DAILY 90 tablet 3  . aspirin 81 MG tablet Take 81 mg by mouth daily.    Marland Kitchen. azelastine (ASTELIN) 0.1 % nasal spray Place 1 spray into both nostrils 2 (two) times daily. Use in each nostril as directed 30 mL 12  . brimonidine (ALPHAGAN) 0.2 % ophthalmic solution Place 1 drop into both eyes 2 (two) times daily.  11  . dorzolamide-timolol (COSOPT) 22.3-6.8 MG/ML ophthalmic solution Place 1 drop into both eyes 2 (two) times daily.  7  . doxazosin (CARDURA) 4 MG tablet TAKE 1/2 TABLET BY MOUTH EVERY  MORNING AND 1 TABLET DAILY AT BEDTIME 135 tablet 1  . HYDROcodone-acetaminophen (NORCO) 10-325 MG tablet Take 1-2 tablets by mouth every 6 (six) hours as needed. 210 tablet 0  . HYDROcodone-acetaminophen (NORCO) 10-325 MG tablet Take 1-2 tablets by mouth every 6 (six) hours as needed for moderate pain. 210 tablet 0  . HYDROcodone-acetaminophen (NORCO) 10-325 MG tablet Take 1-2 tablets by mouth every 6 (six) hours as needed for moderate pain. 210 tablet 0  . ipratropium (ATROVENT) 0.03 % nasal spray Place 2 sprays into the nose 4 (four) times daily. 30 mL 1  . latanoprost (XALATAN) 0.005 % ophthalmic solution Place 1 drop into both eyes at bedtime.  11  . pravastatin (PRAVACHOL) 20 MG tablet TAKE 1 TABLET BY MOUTH EVERY DAY AT BEDTIME 90 tablet 1  . sildenafil (REVATIO) 20 MG tablet Take 2 tablets (40 mg total) as needed by mouth. 30 minutes prior to activity no more than every 24 hours 30 tablet 3  . Vitamin D, Ergocalciferol, (DRISDOL) 50000 units CAPS capsule Take 1 capsule (50,000 Units total) by mouth every 7 (seven) days. 24 capsule 0   No current facility-administered medications on file prior to visit.    Allergies  Allergen Reactions  . Laxative [Bisacodyl] Hives  . Cyclobenzaprine Other (See Comments)    Hand twitching/spasms   No family history on file. Social History   Socioeconomic History  . Marital status: Married    Spouse name: Not  on file  . Number of children: Not on file  . Years of education: Not on file  . Highest education level: Not on file  Occupational History  . Not on file  Social Needs  . Financial resource strain: Not on file  . Food insecurity:    Worry: Not on file    Inability: Not on file  . Transportation needs:    Medical: Not on file    Non-medical: Not on file  Tobacco Use  . Smoking status: Never Smoker  . Smokeless tobacco: Never Used  Substance and Sexual Activity  . Alcohol use: Yes    Alcohol/week: 0.0 oz    Comment: rare  . Drug  use: No  . Sexual activity: Yes    Birth control/protection: None  Lifestyle  . Physical activity:    Days per week: Not on file    Minutes per session: Not on file  . Stress: Not on file  Relationships  . Social connections:    Talks on phone: Not on file    Gets together: Not on file    Attends religious service: Not on file    Active member of club or organization: Not on file    Attends meetings of clubs or organizations: Not on file    Relationship status: Not on file  Other Topics Concern  . Not on file  Social History Narrative  . Not on file   Depression screen [facility] 2/9 02/12/2018 09/25/2017 09/06/2017 05/31/2017 04/17/2017  Decreased Interest 0 0 0 0 0  Down, Depressed, Hopeless 0 0 0 0 0  PHQ - 2 Score 0 0 0 0 0

## 2018-05-15 LAB — CBC WITH DIFFERENTIAL/PLATELET
BASOS ABS: 0 10*3/uL (ref 0.0–0.2)
Basos: 1 %
EOS (ABSOLUTE): 0.2 10*3/uL (ref 0.0–0.4)
EOS: 5 %
Hematocrit: 39.5 % (ref 37.5–51.0)
Hemoglobin: 12.9 g/dL — ABNORMAL LOW (ref 13.0–17.7)
IMMATURE GRANULOCYTES: 0 %
Immature Grans (Abs): 0 10*3/uL (ref 0.0–0.1)
Lymphocytes Absolute: 1.5 10*3/uL (ref 0.7–3.1)
Lymphs: 34 %
MCH: 26.3 pg — ABNORMAL LOW (ref 26.6–33.0)
MCHC: 32.7 g/dL (ref 31.5–35.7)
MCV: 81 fL (ref 79–97)
MONOS ABS: 0.5 10*3/uL (ref 0.1–0.9)
Monocytes: 12 %
NEUTROS PCT: 48 %
Neutrophils Absolute: 2.1 10*3/uL (ref 1.4–7.0)
Platelets: 235 10*3/uL (ref 150–450)
RBC: 4.9 x10E6/uL (ref 4.14–5.80)
RDW: 15.5 % — AB (ref 12.3–15.4)
WBC: 4.4 10*3/uL (ref 3.4–10.8)

## 2018-05-15 LAB — MICROALBUMIN / CREATININE URINE RATIO
CREATININE, UR: 43.8 mg/dL
Microalbumin, Urine: <3 ug/mL

## 2018-05-15 LAB — COMPREHENSIVE METABOLIC PANEL
ALT: 17 IU/L (ref 0–44)
AST: 17 IU/L (ref 0–40)
Albumin/Globulin Ratio: 1.5 (ref 1.2–2.2)
Albumin: 4.4 g/dL (ref 3.6–4.8)
Alkaline Phosphatase: 45 IU/L (ref 39–117)
BUN/Creatinine Ratio: 12 (ref 10–24)
BUN: 14 mg/dL (ref 8–27)
Bilirubin Total: 0.4 mg/dL (ref 0.0–1.2)
CALCIUM: 9.4 mg/dL (ref 8.6–10.2)
CO2: 25 mmol/L (ref 20–29)
CREATININE: 1.14 mg/dL (ref 0.76–1.27)
Chloride: 103 mmol/L (ref 96–106)
GFR calc Af Amer: 78 mL/min/{1.73_m2} (ref >59)
GFR, EST NON AFRICAN AMERICAN: 67 mL/min/{1.73_m2} (ref >59)
GLOBULIN, TOTAL: 3 g/dL (ref 1.5–4.5)
Glucose: 76 mg/dL (ref 65–99)
Potassium: 4.7 mmol/L (ref 3.5–5.2)
SODIUM: 140 mmol/L (ref 134–144)
Total Protein: 7.4 g/dL (ref 6.0–8.5)

## 2018-05-15 LAB — PSA: PROSTATE SPECIFIC AG, SERUM: 0.7 ng/mL (ref 0.0–4.0)

## 2018-05-15 LAB — VITAMIN D 25 HYDROXY (VIT D DEFICIENCY, FRACTURES): Vit D, 25-Hydroxy: 28.1 ng/mL — ABNORMAL LOW (ref 30.0–100.0)

## 2018-05-30 ENCOUNTER — Telehealth: Payer: Self-pay | Admitting: Family Medicine

## 2018-05-30 NOTE — Telephone Encounter (Signed)
Called pt about an appt he has on 08/16/2018 with Dr. Clelia CroftShaw. Provider will not be in the office on that day and he will need to reschedule. Did not leave a voicemail because it doesn't have a number on his dpr for me to be able to leave a detailed message.

## 2018-07-08 ENCOUNTER — Encounter (HOSPITAL_COMMUNITY): Payer: Self-pay | Admitting: Emergency Medicine

## 2018-07-08 ENCOUNTER — Ambulatory Visit (HOSPITAL_COMMUNITY)
Admission: EM | Admit: 2018-07-08 | Discharge: 2018-07-08 | Disposition: A | Discharge status: Home / Self Care | Payer: Medicare Other | Attending: Internal Medicine | Admitting: Internal Medicine

## 2018-07-08 DIAGNOSIS — R42 Dizziness and giddiness: Secondary | ICD-10-CM

## 2018-07-08 DIAGNOSIS — R001 Bradycardia, unspecified: Secondary | ICD-10-CM

## 2018-07-08 LAB — POCT I-STAT, CHEM 8
BUN: 16 mg/dL (ref 8–23)
CALCIUM ION: 1.23 mmol/L (ref 1.15–1.40)
CHLORIDE: 102 mmol/L (ref 98–111)
CREATININE: 1.3 mg/dL — AB (ref 0.61–1.24)
Glucose, Bld: 99 mg/dL (ref 70–99)
HCT: 41 % (ref 39.0–52.0)
Hemoglobin: 13.9 g/dL (ref 13.0–17.0)
Potassium: 4.3 mmol/L (ref 3.5–5.1)
SODIUM: 140 mmol/L (ref 135–145)
TCO2: 27 mmol/L (ref 22–32)

## 2018-07-08 MED ORDER — MECLIZINE HCL 12.5 MG PO TABS: 12.5000 mg | ORAL_TABLET | Freq: Three times a day (TID) | ORAL | 0 refills | Status: DC | PRN

## 2018-07-08 NOTE — ED Triage Notes (Signed)
Pt states he woke up and felt dizziness, pt states it seems to get worse when he lies down. Pt states when he stands upright he feels better.

## 2018-07-08 NOTE — Discharge Instructions (Addendum)
Please begin meclizine PRN up to 3 times a day-this medicine will cause drowsiness. Please also try to perform the Epley maneuver to help work stents back in place and to help with dizziness  Please contact your primary care in the morning to discuss your heart rate/recent blood pressure change  Please go to emergency room if developing worsening dizziness, changes in vision, difficulty walking, passing out, confusion, chest pain, shortness of breath, weakness

## 2018-07-09 NOTE — ED Provider Notes (Signed)
[facility]    CSN: [id number] Arrival date & time: 07/08/18  1653     History   Chief Complaint Chief Complaint  Patient presents with  . Dizziness    HPI Troy Obrien is a 67 y.o. male history of hypertension, DM type II, hyperlipidemia, arthritis presenting today for evaluation of dizziness.  Patient states that this morning when he woke up he had significant dizziness, described as room spinning.  Notes that his symptoms are worse when he lies flat, resolves when he sits up straight.  Denies previous vertigo or previous issues similar.  Denies changes in vision besides the room spinning, denies difficulty speaking or weakness.  Denies any headache.  He has not taken any medicine for his symptoms.  He denies association with rightward or leftward rotation only with lying flat.  Denies chest pain or shortness of breath.  Denies neck stiffness.  Denies fevers.  Denies difficulty walking.  Denies sensation that he is going to pass out.  Patient does note that he has recently lost 10 to 15 pounds over the past 6 weeks, through diet.  HPI  Past Medical History:  Diagnosis Date  . Arthritis   . Glaucoma   . Gout   . Headache(784.0)   . Hyperlipidemia   . Hypertension   . Inguinal hernia   . Rickets, fetal   . Vitamin D deficiency     Patient Active Problem List   Diagnosis Date Noted  . Nasal turbinate hypertrophy 09/05/2017  . Chronic left-sided low back pain without sciatica 09/05/2017  . Chronic pain syndrome 04/19/2017  . Opioid type dependence, continuous (HCC) 04/19/2017  . Hyperlipidemia 05/02/2016  . Hypersomnia with sleep apnea 02/17/2016  . Snoring 02/17/2016  . Nocturia more than twice per night 02/17/2016  . Nasal obstruction 02/17/2016  . Sleep related headaches 02/17/2016  . Morbid obesity due to excess calories (HCC) 02/17/2016  . Vitamin D deficiency 02/04/2015  . Other chronic sinusitis 02/04/2015  . Rhinitis, allergic 02/04/2015  . BPH  associated with nocturia 05/10/2014  . Polypharmacy 04/13/2014  . Raynaud phenomenon 04/13/2014  . Erectile dysfunction 04/13/2014  . Type 2 diabetes mellitus (HCC) 04/13/2014  . Chronic right-sided thoracic back pain 03/26/2014  . Right groin pain 02/20/2013  . Arthritis of hand 07/08/2012  . Fatigue 07/08/2012  . Sleep apnea 07/08/2012  . Hernia 07/08/2012  . Inguinal hernia, right 03/30/2012  . Right upper quadrant pain 03/30/2012  . Back pain 03/30/2012  . Hypertension 02/02/2012  . Glaucoma 02/02/2012    Past Surgical History:  Procedure Laterality Date  . carpel tunnel     bilateral  . CIRCUMCISION  1972  . INGUINAL HERNIA REPAIR  10/11/2012   Procedure: HERNIA REPAIR INGUINAL ADULT;  Surgeon: Robyne Askew, MD;  Location: [facility] OR;  Service: General;  Laterality: Right;  . INSERTION OF MESH  10/11/2012   Procedure: INSERTION OF MESH;  Surgeon: Robyne Askew, MD;  Location: [facility] OR;  Service: General;  Laterality: Right;  . TONSILLECTOMY  1963       Home Medications    Prior to Admission medications   Medication Sig Start Date End Date Taking? Authorizing Provider  amLODipine-olmesartan (AZOR) 5-20 MG tablet Take 1 tablet by mouth daily. 05/14/18   Sherren Mocha, MD  aspirin 81 MG tablet Take 81 mg by mouth daily.    [provider]  azelastine (ASTELIN) 0.1 % nasal spray Place 1 spray into both nostrils 2 (  two) times daily. Use in each nostril as directed 02/12/18   Sherren Mocha, MD  brimonidine [facility]) 0.2 % ophthalmic solution Place 1 drop into both eyes 2 (two) times daily. 11/18/17   Tilden Dome, MD  dorzolamide-timolol (COSOPT) 22.3-6.8 MG/ML ophthalmic solution Place 1 drop into both eyes 2 (two) times daily. 01/24/18   Tilden Dome, MD  doxazosin (CARDURA) 4 MG tablet TAKE 1/2 TABLET BY MOUTH EVERY MORNING AND 1 TABLET DAILY AT BEDTIME 02/12/18   Sherren Mocha, MD  HYDROcodone-acetaminophen [facility]) 10-325 MG tablet Take 1-2 tablets by mouth  every 6 (six) hours as needed. 07/31/18   Sherren Mocha, MD  HYDROcodone-acetaminophen [facility]) 10-325 MG tablet Take 1-2 tablets by mouth every 6 (six) hours as needed for moderate pain. 07/01/18   Sherren Mocha, MD  HYDROcodone-acetaminophen [facility]) 10-325 MG tablet Take 1-2 tablets by mouth every 6 (six) hours as needed for moderate pain. 05/31/18   Sherren Mocha, MD  ipratropium (ATROVENT) 0.03 % nasal spray Place 2 sprays into the nose 4 (four) times daily. 02/12/18   Sherren Mocha, MD  latanoprost (XALATAN) 0.005 % ophthalmic solution Place 1 drop into both eyes at bedtime. 01/16/18   Bond, Doran Stabler, MD  meclizine (ANTIVERT) 12.5 MG tablet Take 1 tablet (12.5 mg total) by mouth 3 (three) times daily as needed for dizziness. 07/08/18   Clover Feehan C, PA-C  pravastatin (PRAVACHOL) 20 MG tablet TAKE 1 TABLET BY MOUTH EVERY DAY AT BEDTIME 05/14/18   Sherren Mocha, MD  sildenafil (REVATIO) 20 MG tablet Take 2 tablets (40 mg total) by mouth as needed. 30 minutes prior to activity no more than every 24 hours 05/14/18   Sherren Mocha, MD  Vitamin D, Ergocalciferol, (DRISDOL) 50000 units CAPS capsule Take 1 capsule (50,000 Units total) by mouth every 7 (seven) days. 02/12/18   Sherren Mocha, MD    Family History No family history on file.  Social History Social History   Tobacco Use  . Smoking status: Never Smoker  . Smokeless tobacco: Never Used  Substance Use Topics  . Alcohol use: Yes    Alcohol/week: 0.0 standard drinks    Comment: rare  . Drug use: No     Allergies   Laxative [bisacodyl] and Cyclobenzaprine   Review of Systems Review of Systems  Constitutional: Negative for fatigue and fever.  HENT: Negative for congestion, sinus pressure and sore throat.   Eyes: Negative for photophobia, pain and visual disturbance.  Respiratory: Negative for cough and shortness of breath.   Cardiovascular: Negative for chest pain.  Gastrointestinal: Negative for abdominal pain, nausea and vomiting.    Genitourinary: Negative for decreased urine volume and hematuria.  Musculoskeletal: Negative for myalgias, neck pain and neck stiffness.  Neurological: Positive for dizziness. Negative for syncope, facial asymmetry, speech difficulty, weakness, light-headedness, numbness and headaches.     Physical Exam Triage Vital Signs ED Triage Vitals  Enc Vitals Group     BP 07/08/18 1731 119/68     Pulse Rate 07/08/18 1731 (!) 44     Resp 07/08/18 1731 18     Temp 07/08/18 1731 (!) 97.3 F (36.3 C)     Temp src --      SpO2 07/08/18 1731 97 %     Weight --      Height --      Head Circumference --      Peak Flow --      Pain  Score 07/08/18 1912 0     Pain Loc --      Pain Edu? --      Excl. in GC? --    Orthostatic VS for the past 24 hrs:  BP- Lying Pulse- Lying BP- Sitting Pulse- Sitting BP- Standing at 0 minutes Pulse- Standing at 0 minutes  07/08/18 1839 136/70 (!) 43 139/75 (!) 46 139/81 (!) 45    Updated Vital Signs BP 119/68   Pulse (!) 44   Temp (!) 97.3 F (36.3 C)   Resp 18   SpO2 97%   Visual Acuity Right Eye Distance:   Left Eye Distance:   Bilateral Distance:    Right Eye Near:   Left Eye Near:    Bilateral Near:     Physical Exam  Constitutional: He appears well-developed and well-nourished.  HENT:  Head: Normocephalic and atraumatic.  Mouth/Throat: Oropharynx is clear and moist.  Bilateral ears without tenderness to palpation of external auricle, tragus and mastoid, EAC's without erythema or swelling, TM's with good bony landmarks and cone of light. Non erythematous.  Oral mucosa pink and moist, no tonsillar enlargement or exudate. Posterior pharynx patent and nonerythematous, no uvula deviation or swelling. Normal phonation.  Eyes: Pupils are equal, round, and reactive to light. Conjunctivae and EOM are normal.  Neck: Neck supple.  Cardiovascular: Regular rhythm.  No murmur heard. Bradycardic  Pulmonary/Chest: Effort normal and breath sounds normal.  No respiratory distress.  Breathing comfortably at rest, CTABL, no wheezing, rales or other adventitious sounds auscultated  Abdominal: Soft. There is no tenderness.  Musculoskeletal: He exhibits no edema.  Neurological: He is alert.  Patient A&O x3, cranial nerves II-XII grossly intact, strength at shoulders, hips and knees 5/5, equal bilaterally, patellar reflex 2+ bilaterally. Normal Finger to nose, RAM and heel to shin. Negative Romberg and Pronator Drift. Gait without abnormality.  Positive Dix-Hallpike with horizontal nystagmus, worsens as patient remains supine  Skin: Skin is warm and dry.  Psychiatric: He has a normal mood and affect.  Nursing note and vitals reviewed.    UC Treatments / Results  Labs (all labs ordered are listed, but only abnormal results are displayed) Labs Reviewed  POCT I-STAT, CHEM 8 - Abnormal; Notable for the following components:      Result Value   Creatinine, Ser 1.30 (*)    All other components within normal limits    EKG None  Radiology No results found.  Procedures Procedures (including critical care time)  Medications Ordered in UC Medications - No data to display  Initial Impression / Assessment and Plan / UC Course  I have reviewed the triage vital signs and the nursing notes.  Pertinent labs & imaging results that were available during my care of the patient were reviewed by me and considered in my medical decision making (see chart for details).    Discussed case with Dr. Delton See who is agreeable with plan. EKG sinus bradycardia, slight biphasic T wave isolated with V2, no signs of ischemia or infarction. Patient with dizziness, positive Dix-headache and positional relation, likely vertigo.  Patient also has bradycardia, blood pressure stable.  Letter further chart review his blood pressure medicine was recently changed approximately 6 weeks ago on top of him having recent weight loss.  Vertigo seems to be peripheral, will treat  with meclizine, discussed cutting blood pressure medicine half and contacting primary care for further management of his bradycardia.  Continue to monitor blood pressure and heart rate at home as well  as dizziness, go to emergency room if symptoms worsening, developing headache, difficulty speaking, difficulty with balance.Discussed strict return precautions. Patient verbalized understanding and is agreeable with plan.  Final Clinical Impressions(s) / UC Diagnoses   Final diagnoses:  Dizziness  Vertigo  Bradycardia     Discharge Instructions     Please begin meclizine PRN up to 3 times a day-this medicine will cause drowsiness. Please also try to perform the Epley maneuver to help work stents back in place and to help with dizziness  Please contact your primary care in the morning to discuss your heart rate/recent blood pressure change  Please go to emergency room if developing worsening dizziness, changes in vision, difficulty walking, passing out, confusion, chest pain, shortness of breath, weakness   ED Prescriptions    Medication Sig Dispense Auth. Provider   meclizine (ANTIVERT) 12.5 MG tablet  (Status: Discontinued) Take 1 tablet (12.5 mg total) by mouth 3 (three) times daily as needed for dizziness. 30 tablet Aquinnah Devin C, PA-C   meclizine (ANTIVERT) 12.5 MG tablet Take 1 tablet (12.5 mg total) by mouth 3 (three) times daily as needed for dizziness. 30 tablet Koben Daman, [place] C, PA-C     Controlled Substance Prescriptions [place] Controlled Substance Registry consulted? Not Applicable   Lew Dawes, [place] 07/09/18 [phone]

## 2018-07-10 ENCOUNTER — Ambulatory Visit: Payer: Self-pay | Admitting: *Deleted

## 2018-07-10 NOTE — Telephone Encounter (Signed)
Pt reports seen at [facility] 07/08/18, Dx with vertigo. States is still experiencing spinning, "It's better but not gone."  Reports occurs with turning head, bending over, sitting to standing. Denies any other symptoms. States has been taking the meclizine as prescribed 3x's PRN. Reports has not practiced the Epley maneuver.  Pt's HR at UC 43-46. During call 49 with BP 127/78.  Pt states he is not going to take his BP medications tonight. Appt made for tomorrow at 1320 with B. Barnett Abu.  Offered and encouraged earlier appt but pt declined. Care advise given per protocol.  Instructed pt to go to ED if symptoms worsen, monitor HR, BP. Reason for Disposition . [1] MODERATE dizziness (e.g., vertigo; feels very unsteady, interferes with normal activities) AND [2] has NOT been evaluated by physician for this    Seen in UC 07/08/18; vertigo "Better but not gone."  Answer Assessment - Initial Assessment Questions 1. DESCRIPTION: "Describe your dizziness."     Vertigo, seen in UC 07/08/18 2. VERTIGO: "Do you feel like either you or the room is spinning or tilting?"      yes 3. LIGHTHEADED: "Do you feel lightheaded?" (e.g., somewhat faint, woozy, weak upon standing)     no 4. SEVERITY: "How bad is it?"  "Can you walk?"   - MILD - Feels unsteady but walking normally.   - MODERATE - Feels very unsteady when walking, but not falling; interferes with normal activities (e.g., school, work) .   - SEVERE - Unable to walk without falling (requires assistance).     moderate 5. ONSET:  "When did the dizziness begin?"     07/08/18 6. AGGRAVATING FACTORS: "Does anything make it worse?" (e.g., standing, change in head position)    Turning head, bending over, sitting to standing 7. CAUSE: "What do you think is causing the dizziness?"     Seen at [facility], "Vertigo" 8. RECURRENT SYMPTOM: "Have you had dizziness before?" If so, ask: "When was the last time?" "What happened that time?"     No, except 07/08/18 9. OTHER SYMPTOMS: "Do you have  any other symptoms?" (e.g., headache, weakness, numbness, vomiting, earache)     No, nausea subsided with antivert  Protocols used: DIZZINESS - VERTIGO-A-AH

## 2018-07-11 ENCOUNTER — Encounter: Payer: Self-pay | Admitting: Physician Assistant

## 2018-07-11 ENCOUNTER — Other Ambulatory Visit: Payer: Self-pay

## 2018-07-11 ENCOUNTER — Ambulatory Visit: Payer: Medicare Other | Admitting: Physician Assistant

## 2018-07-11 VITALS — BP 113/70 | HR 50 | Temp 97.9°F | Resp 18 | Ht 68.19 in | Wt 241.4 lb

## 2018-07-11 DIAGNOSIS — R001 Bradycardia, unspecified: Secondary | ICD-10-CM | POA: Diagnosis not present

## 2018-07-11 DIAGNOSIS — H8112 Benign paroxysmal vertigo, left ear: Secondary | ICD-10-CM

## 2018-07-11 DIAGNOSIS — I10 Essential (primary) hypertension: Secondary | ICD-10-CM | POA: Diagnosis not present

## 2018-07-11 MED ORDER — MECLIZINE HCL 12.5 MG PO TABS: 12.5000 mg | ORAL_TABLET | Freq: Three times a day (TID) | ORAL | 0 refills | Status: DC | PRN

## 2018-07-11 NOTE — Progress Notes (Signed)
Troy Obrien  MRN: [id number] DOB: [birth year]  Subjective:  Troy Obrien is a 67 y.o. male with PMH of T2DM, HTN, HLD, and arthritis seen in office today for a chief complaint of follow-up on dizziness.  Patient seen in the urgent care on 07/08/2018 for dizziness and diagnosed with vertigo.  BP was 119/68 and HR was 44bpm. EKG sinus bradycardia, slight biphasic T wave isolated with V2, no signs of ischemia or infarction.  Positive Dix-Hallpike.  Given Rx for meclizine.  Recommended he cut his blood pressure pill in half.  Today, reports he is still having some dizziness, not as bad as it was at the urgent care. Thinks the meclizine is working for him.  Dizziness is worse when he turns onto his left side.  It lasts for a few seconds and then resolves.  Feels like the room is spinning. Was having nausea when it was at its worse.   Denies vomiting, visual changes, difficulty speaking, weakness, numbness, tingling, headache, chest pain, shortness of breath, neck stiffness, fever, gait instability, and syncope.  Has lost 10 to 15 pounds over the past 6 weeks or diet changes.  Has stopped both cardura 4mg  and amlodipine-olmesartan 5-20mg  daily due to low blood pressure and heart rate readings.   Review of Systems  Constitutional: Negative for chills, diaphoresis and fever.      Patient Active Problem List   Diagnosis Date Noted  . Nasal turbinate hypertrophy 09/05/2017  . Chronic left-sided low back pain without sciatica 09/05/2017  . Chronic pain syndrome 04/19/2017  . Opioid type dependence, continuous (HCC) 04/19/2017  . Hyperlipidemia 05/02/2016  . Hypersomnia with sleep apnea 02/17/2016  . Snoring 02/17/2016  . Nocturia more than twice per night 02/17/2016  . Nasal obstruction 02/17/2016  . Sleep related headaches 02/17/2016  . Morbid obesity due to excess calories (HCC) 02/17/2016  . Vitamin D deficiency 02/04/2015  . Other chronic sinusitis 02/04/2015  . Rhinitis, allergic  02/04/2015  . BPH associated with nocturia 05/10/2014  . Polypharmacy 04/13/2014  . Raynaud phenomenon 04/13/2014  . Erectile dysfunction 04/13/2014  . Type 2 diabetes mellitus (HCC) 04/13/2014  . Chronic right-sided thoracic back pain 03/26/2014  . Right groin pain 02/20/2013  . Arthritis of hand 07/08/2012  . Fatigue 07/08/2012  . Sleep apnea 07/08/2012  . Hernia 07/08/2012  . Inguinal hernia, right 03/30/2012  . Right upper quadrant pain 03/30/2012  . Back pain 03/30/2012  . Hypertension 02/02/2012  . Glaucoma 02/02/2012    Current Outpatient Medications on File Prior to Visit  Medication Sig Dispense Refill  . amLODipine-olmesartan (AZOR) 5-20 MG tablet Take 1 tablet by mouth daily. 90 tablet 1  . aspirin 81 MG tablet Take 81 mg by mouth daily.    Marland Kitchen azelastine (ASTELIN) 0.1 % nasal spray Place 1 spray into both nostrils 2 (two) times daily. Use in each nostril as directed 30 mL 12  . brimonidine (ALPHAGAN) 0.2 % ophthalmic solution Place 1 drop into both eyes 2 (two) times daily.  11  . dorzolamide-timolol (COSOPT) 22.3-6.8 MG/ML ophthalmic solution Place 1 drop into both eyes 2 (two) times daily.  7  . doxazosin (CARDURA) 4 MG tablet TAKE 1/2 TABLET BY MOUTH EVERY MORNING AND 1 TABLET DAILY AT BEDTIME 135 tablet 1  . [START ON 07/31/2018] HYDROcodone-acetaminophen (NORCO) 10-325 MG tablet Take 1-2 tablets by mouth every 6 (six) hours as needed. 210 tablet 0  . HYDROcodone-acetaminophen (NORCO) 10-325 MG tablet Take 1-2 tablets by mouth every 6 (  six) hours as needed for moderate pain. 210 tablet 0  . HYDROcodone-acetaminophen (NORCO) 10-325 MG tablet Take 1-2 tablets by mouth every 6 (six) hours as needed for moderate pain. 210 tablet 0  . ipratropium (ATROVENT) 0.03 % nasal spray Place 2 sprays into the nose 4 (four) times daily. 30 mL 1  . latanoprost (XALATAN) 0.005 % ophthalmic solution Place 1 drop into both eyes at bedtime.  11  . meclizine (ANTIVERT) 12.5 MG tablet Take 1  tablet (12.5 mg total) by mouth 3 (three) times daily as needed for dizziness. 30 tablet 0  . pravastatin (PRAVACHOL) 20 MG tablet TAKE 1 TABLET BY MOUTH EVERY DAY AT BEDTIME 90 tablet 1  . sildenafil (REVATIO) 20 MG tablet Take 2 tablets (40 mg total) by mouth as needed. 30 minutes prior to activity no more than every 24 hours 30 tablet 3  . Vitamin D, Ergocalciferol, (DRISDOL) 50000 units CAPS capsule Take 1 capsule (50,000 Units total) by mouth every 7 (seven) days. 24 capsule 0   No current facility-administered medications on file prior to visit.     Allergies  Allergen Reactions  . Laxative [Bisacodyl] Hives  . Cyclobenzaprine Other (See Comments)    Hand twitching/spasms      Social History   Socioeconomic History  . Marital status: Married    Spouse name: Not on file  . Number of children: 0  . Years of education: Not on file  . Highest education level: Not on file  Occupational History  . Not on file  Social Needs  . Financial resource strain: Not on file  . Food insecurity:    Worry: Not on file    Inability: Not on file  . Transportation needs:    Medical: Not on file    Non-medical: Not on file  Tobacco Use  . Smoking status: Never Smoker  . Smokeless tobacco: Never Used  Substance and Sexual Activity  . Alcohol use: Yes    Alcohol/week: 0.0 standard drinks    Comment: rare  . Drug use: No  . Sexual activity: Yes    Birth control/protection: None  Lifestyle  . Physical activity:    Days per week: Not on file    Minutes per session: Not on file  . Stress: Not on file  Relationships  . Social connections:    Talks on phone: Not on file    Gets together: Not on file    Attends religious service: Not on file    Active member of club or organization: Not on file    Attends meetings of clubs or organizations: Not on file    Relationship status: Not on file  . Intimate partner violence:    Fear of current or ex partner: Not on file    Emotionally abused:  Not on file    Physically abused: Not on file    Forced sexual activity: Not on file  Other Topics Concern  . Not on file  Social History Narrative  . Not on file    Objective:  BP 113/70   Pulse (!) 50   Temp 97.9 F (36.6 C) (Oral)   Resp 18   Ht 5' 8.19" (1.732 m)   Wt 241 lb 6.4 oz (109.5 kg)   SpO2 96%   BMI 36.50 kg/m   Physical Exam  Constitutional: He is oriented to person, place, and time. He appears well-developed and well-nourished. No distress.  HENT:  Head: Normocephalic and atraumatic.  Mouth/Throat: Uvula is  midline, oropharynx is clear and moist and mucous membranes are normal. No tonsillar exudate.  Eyes: Pupils are equal, round, and reactive to light. Conjunctivae and EOM are normal.  Neck: Normal range of motion.  Cardiovascular: Regular rhythm, normal heart sounds and intact distal pulses. Bradycardia present.  Pulmonary/Chest: Effort normal and breath sounds normal. He has no decreased breath sounds. He has no wheezes. He has no rhonchi. He has no rales.  Musculoskeletal:       Right lower leg: He exhibits no swelling.       Left lower leg: He exhibits no swelling.  Neurological: He is alert and oriented to person, place, and time. No cranial nerve deficit.  Positive Dix Hallpike to the left, mild fatigable nystagmus noted.   Skin: Skin is warm and dry.  Psychiatric: He has a normal mood and affect.  Vitals reviewed.  BP Readings from Last 3 Encounters:  07/11/18 113/70  07/08/18 119/68  05/14/18 (!) 148/84    Wt Readings from Last 3 Encounters:  07/11/18 241 lb 6.4 oz (109.5 kg)  05/14/18 255 lb (115.7 kg)  02/12/18 254 lb (115.2 kg)    Pulse Readings from Last 3 Encounters:  07/11/18 (!) 50  07/08/18 (!) 44  05/14/18 60   Orthostatic VS for the past 24 hrs:  BP- Lying Pulse- Lying BP- Sitting Pulse- Sitting BP- Standing at 0 minutes Pulse- Standing at 0 minutes  07/11/18 1350 128/79 (!) 48 122/72 (!) 48 126/75 51    Epley maneuver  performed with success.  Assessment and Plan :  1. Benign paroxysmal positional vertigo of left ear History and physical exam findings are consistent with BPPV.  Patient does have positive Dix-Hallpike to the left, no other acute findings noted on exam.  Symptoms are not as severe as they initially were 3 days ago.  Epley maneuver performed.  Patient reports feeling better.  Given education material on Epley maneuver to do at home.  Also refilled meclizine. - meclizine (ANTIVERT) 12.5 MG tablet; Take 1 tablet (12.5 mg total) by mouth 3 (three) times daily as needed for dizziness.  Dispense: 30 tablet; Refill: 0 - Orthostatic vital signs  2. Essential hypertension 3. Bradycardia Patient has lost 14 pounds since last office visit in July 2019.  He has changed his diet tremendously.  This is likely contributing to lower blood pressure.  He has not taken blood pressure medication in 2 days.  BP is well controlled at 113/70.  Recommend he continue holding Cardura and amlodipine-olmesartan at this time.  Encouraged him to continue checking blood pressure once daily at different times and documenting these values.  Goal is less than 140/90 and greater than 100/60.  Continue checking heart rate.  Per review of patient's prior vital signs in office, his heart rate seems to run between 52-70 BPM.  Encouraged him to seek care if his heart rate consistently runs in the 40s or lower with medication changes.   Benjiman Core PA-C  [facility] 07/11/2018 1:50 PM

## 2018-07-11 NOTE — Patient Instructions (Addendum)
Your history and physical exam are consistent with benign positional vertigo.  I have given you information on how to perform the Epley maneuver at home.  I recommend at this time to discontinue blood pressure medications as your blood pressure is very well controlled without them.  Continue checking your blood pressure daily over the next couple weeks and document these values.  It is best to check your blood pressure at different times of the day.  Your goal is < 140/90 and > 100/60.  It seems that your heart rate runs on the lower side of normal, keep an eye on this. If it is consistently in the 40s, please return to office, if it goes lower, seek care sooner. Otherwise, follow-up in 2 weeks for reevaluation.  Return sooner if any of your symptoms worsen or you develop new concerning symptoms.  Benign Positional Vertigo Vertigo is the feeling that you or your surroundings are moving when they are not. Benign positional vertigo is the most common form of vertigo. The cause of this condition is not serious (is benign). This condition is triggered by certain movements and positions (is positional). This condition can be dangerous if it occurs while you are doing something that could endanger you or others, such as driving. What are the causes? In many cases, the cause of this condition is not known. It may be caused by a disturbance in an area of the inner ear that helps your brain to sense movement and balance. This disturbance can be caused by a viral infection (labyrinthitis), head injury, or repetitive motion. What increases the risk? This condition is more likely to develop in:  Women.  People who are 65 years of age or older.  What are the signs or symptoms? Symptoms of this condition usually happen when you move your head or your eyes in different directions. Symptoms may start suddenly, and they usually last for less than a minute. Symptoms may include:  Loss of balance and  falling.  Feeling like you are spinning or moving.  Feeling like your surroundings are spinning or moving.  Nausea and vomiting.  Blurred vision.  Dizziness.  Involuntary eye movement (nystagmus).  Symptoms can be mild and cause only slight annoyance, or they can be severe and interfere with daily life. Episodes of benign positional vertigo may return (recur) over time, and they may be triggered by certain movements. Symptoms may improve over time. How is this diagnosed? This condition is usually diagnosed by medical history and a physical exam of the head, neck, and ears. You may be referred to a health care provider who specializes in ear, nose, and throat (ENT) problems (otolaryngologist) or a provider who specializes in disorders of the nervous system (neurologist). You may have additional testing, including:  MRI.  A CT scan.  Eye movement tests. Your health care provider may ask you to change positions quickly while he or she watches you for symptoms of benign positional vertigo, such as nystagmus. Eye movement may be tested with an electronystagmogram (ENG), caloric stimulation, the Dix-Hallpike test, or the roll test.  An electroencephalogram (EEG). This records electrical activity in your brain.  Hearing tests.  How is this treated? Usually, your health care provider will treat this by moving your head in specific positions to adjust your inner ear back to normal. Surgery may be needed in severe cases, but this is rare. In some cases, benign positional vertigo may resolve on its own in 2-4 weeks. Follow these instructions at  home: Safety  Move slowly.Avoid sudden body or head movements.  Avoid driving.  Avoid operating heavy machinery.  Avoid doing any tasks that would be dangerous to you or others if a vertigo episode would occur.  If you have trouble walking or keeping your balance, try using a cane for stability. If you feel dizzy or unstable, sit down right  away.  Return to your normal activities as told by your health care provider. Ask your health care provider what activities are safe for you. General instructions  Take over-the-counter and prescription medicines only as told by your health care provider.  Avoid certain positions or movements as told by your health care provider.  Drink enough fluid to keep your urine clear or pale yellow.  Keep all follow-up visits as told by your health care provider. This is important. Contact a health care provider if:  You have a fever.  Your condition gets worse or you develop new symptoms.  Your family or friends notice any behavioral changes.  Your nausea or vomiting gets worse.  You have numbness or a "pins and needles" sensation. Get help right away if:  You have difficulty speaking or moving.  You are always dizzy.  You faint.  You develop severe headaches.  You have weakness in your legs or arms.  You have changes in your hearing or vision.  You develop a stiff neck.  You develop sensitivity to light. This information is not intended to replace advice given to you by your health care provider. Make sure you discuss any questions you have with your health care provider. Document Released: 07/25/2006 Document Revised: 03/24/2016 Document Reviewed: 02/09/2015 Elsevier Interactive Patient Education  2018 [employer]. How to Perform the Epley Maneuver The Epley maneuver is an exercise that relieves symptoms of vertigo. Vertigo is the feeling that you or your surroundings are moving when they are not. When you feel vertigo, you may feel like the room is spinning and have trouble walking. Dizziness is a little different than vertigo. When you are dizzy, you may feel unsteady or light-headed. You can do this maneuver at home whenever you have symptoms of vertigo. You can do it up to 3 times a day until your symptoms go away. Even though the Epley maneuver may relieve your vertigo  for a few weeks, it is possible that your symptoms will return. This maneuver relieves vertigo, but it does not relieve dizziness. What are the risks? If it is done correctly, the Epley maneuver is considered safe. Sometimes it can lead to dizziness or nausea that goes away after a short time. If you develop other symptoms, such as changes in vision, weakness, or numbness, stop doing the maneuver and call your health care provider. How to perform the Epley maneuver 1. Sit on the edge of a bed or table with your back straight and your legs extended or hanging over the edge of the bed or table. 2. Turn your head halfway toward the affected ear or side. 3. Lie backward quickly with your head turned until you are lying flat on your back. You may want to position a pillow under your shoulders. 4. Hold this position for 30 seconds. You may experience an attack of vertigo. This is normal. 5. Turn your head to the opposite direction until your unaffected ear is facing the floor. 6. Hold this position for 30 seconds. You may experience an attack of vertigo. This is normal. Hold this position until the vertigo stops. 7. Turn  your whole body to the same side as your head. Hold for another 30 seconds. 8. Sit back up. You can repeat this exercise up to 3 times a day. Follow these instructions at home:  After doing the Epley maneuver, you can return to your normal activities.  Ask your health care provider if there is anything you should do at home to prevent vertigo. He or she may recommend that you: ? Keep your head raised (elevated) with two or more pillows while you sleep. ? Do not sleep on the side of your affected ear. ? Get up slowly from bed. ? Avoid sudden movements during the day. ? Avoid extreme head movement, like looking up or bending over. Contact a health care provider if:  Your vertigo gets worse.  You have other symptoms, including: ? Nausea. ? Vomiting. ? Headache. Get help right  away if:  You have vision changes.  You have a severe or worsening headache or neck pain.  You cannot stop vomiting.  You have new numbness or weakness in any part of your body. Summary  Vertigo is the feeling that you or your surroundings are moving when they are not.  The Epley maneuver is an exercise that relieves symptoms of vertigo.  If the Epley maneuver is done correctly, it is considered safe. You can do it up to 3 times a day. This information is not intended to replace advice given to you by your health care provider. Make sure you discuss any questions you have with your health care provider. Document Released: 10/22/2013 Document Revised: 09/06/2016 Document Reviewed: 09/06/2016 Elsevier Interactive Patient Education  2017 [employer].  IF you received an x-ray today, you will receive an invoice from [facility]. Please contact [facility] at [phone] with questions or concerns regarding your invoice.   IF you received labwork today, you will receive an invoice from [facility]. Please contact [facility] at [phone] with questions or concerns regarding your invoice.   Our billing staff will not be able to assist you with questions regarding bills from these companies.  You will be contacted with the lab results as soon as they are available. The fastest way to get your results is to activate your My Chart account. Instructions are located on the last page of this paperwork. If you have not heard from [place] regarding the results in 2 weeks, please contact this office.

## 2018-07-23 ENCOUNTER — Ambulatory Visit (INDEPENDENT_AMBULATORY_CARE_PROVIDER_SITE_OTHER): Payer: Medicare Other | Admitting: Family Medicine

## 2018-07-23 DIAGNOSIS — R7989 Other specified abnormal findings of blood chemistry: Secondary | ICD-10-CM

## 2018-07-23 NOTE — Progress Notes (Addendum)
Lab only visit - patient not seen/examined.

## 2018-07-24 LAB — TESTT+TESTF+SHBG
Sex Hormone Binding: 30 nmol/L (ref 19.3–76.4)
TESTOSTERONE FREE: 5.3 pg/mL — AB (ref 6.6–18.1)
Testosterone, total: 186.7 ng/dL — ABNORMAL LOW (ref 264.0–916.0)

## 2018-07-25 ENCOUNTER — Telehealth: Payer: Self-pay | Admitting: Family Medicine

## 2018-07-25 NOTE — Telephone Encounter (Signed)
Please review

## 2018-07-25 NOTE — Telephone Encounter (Signed)
Copied from CRM [id number]. Topic: General - Other >> Jul 25, 2018  1:45 PM Jaquita Rector A wrote:  Reason for CRM: Patient called to request call back with lab results. Labs done on 07/23/18 Also would like to know what is the next step to deal with issues if there are any.

## 2018-07-27 NOTE — Telephone Encounter (Signed)
He was here for lab only visit that day, and I believe the lab work was ordered by Dr. Clelia Croft.  Results may be in her inbox.  However on my brief review it does appear that his testosterone was low.  Would defer to Dr. Clelia Croft for plan/treatment.

## 2018-08-06 NOTE — ED Provider Notes (Signed)
By signing my name below, I, Troy Obrien, attest that this documentation has been prepared under the directions and in the presence of Dr. Levell July. Troy Obrien. Electronically Signed: Greer Obrien, Medical Scribe 05/14/18 at 3:20pm.  Subjective:   Patient ID: Troy Obrien is a 67 y.o. male.  Chief Complaint:  Chief Complaint  Patient presents with  . Annual Exam     Troy Obrien is a 67 y.o. male who presents to Primary Care at [facility] for an annual exam. Last cp was in 2015. He is hydrocodone 10#210 per month, last filled 4/3, 5/1,3/1,6/1,7/1, same  pharmacy no other providers. Has been on this regime since 03/18/2017, and is very aware that this is the maximum amount of this medication. He has had appropriate drug and urine screen on 02/12/18.  He reports no new changes with his nose, and still having trouble sleeping. Reports difficulty breathing and responding well to Afrin. He reports going to an allergist a couple years ago. And wanting to receive further treatment for nose to potentially breathing normal. Patients wife asked about CBC oils for Troy Obrien. Blood pressure was elevated and there is some swelling in ankles, patient reports that he does consume a lot of salt.    Primary Preventative Screenings: Prostate Cancer: STI screening:  Colorectal Cancer: Tobacco use/AAA/Lung/EtOH/Illicit substances: Patient reports that he does not smoke. Cardiac:  Weight/Blood sugar/Diet/Exercise: Patient reported that diet consists of high salt intake.  BMI Readings from Last 3 Encounters:  05/14/18 39.78 kg/m  02/12/18 39.19 kg/m  11/22/17 39.66 kg/m   Lab Results  Component Value Date   HGBA1C 6.6 09/06/2017   OTC/Vit/Supp/Herbal: Dentist/Optho: Immunizations:  Immunization History  Administered Date(s) Administered  . Influenza Inj Mdck Quad Pf 08/16/2017  . Influenza-Unspecified 09/22/2016, 07/31/2017  . Tdap 03/26/2014    Chronic Medical Conditions:   Past Medical  History:  Diagnosis Date  . Arthritis   . Glaucoma   . Gout   . Headache(784.0)   . Hyperlipidemia   . Hypertension   . Inguinal hernia   . Rickets, fetal   . Vitamin D deficiency     Past Surgical History:  Procedure Laterality Date  . carpel tunnel     bilateral  . CIRCUMCISION  1972  . INGUINAL HERNIA REPAIR  10/11/2012   Procedure: HERNIA REPAIR INGUINAL ADULT;  Surgeon: Robyne Askew, MD;  Location[facility];  Service: General;  Laterality: Right;  . INSERTION OF MESH  10/11/2012   Procedure: INSERTION OF MESH;  Surgeon: Robyne Askew, MD;  Location: [facility] OR;  Service: General;  Laterality: Right;  . TONSILLECTOMY  1963    No family history on file.  Social History   Tobacco Use  . Smoking status: Never Smoker  . Smokeless tobacco: Never Used  Substance Use Topics  . Alcohol use: Yes    Alcohol/week: 0.0 standard drinks    Comment: rare  . Drug use: No    Review of Systems  Constitutional: Positive for fatigue and unexpected weight change. Negative for chills.  HENT: Negative.   Eyes: Negative.   Respiratory: Negative.   Cardiovascular: Negative.   Endocrine: Negative.   Musculoskeletal: Negative.   Skin: Negative.   Hematological: Negative.   Psychiatric/Behavioral: Negative.     Objective:   BP (!) 148/84   Pulse 60   Temp 97.6 F (36.4 C) (Oral)   Resp 16   Ht 5\' 7"  (1.702 m)   Wt 255 lb (115.7 kg)  SpO2 98%   BMI 39.94 kg/m   Physical Exam  Constitutional: He is oriented to person, place, and time. He appears well-developed and well-nourished. No distress.  HENT:  Right Ear: Tympanic membrane normal.  Left Ear: Tympanic membrane normal.  Nose: Nose normal.  Mouth/Throat: Oropharynx is clear and moist.  Eyes: Right eye exhibits no discharge. Left eye exhibits no discharge.  Neck: Normal range of motion. No thyroid mass and no thyromegaly present.  Cardiovascular: Regular rhythm.  Pulmonary/Chest: Effort normal.  Difficult to  ascultate.   Abdominal: Soft. Bowel sounds are normal. He exhibits no mass. There is no hepatosplenomegaly or splenomegaly.  Musculoskeletal: Normal range of motion.       Right foot: There is swelling.       Left foot: There is swelling.  1+ bilaterally.  Neurological: He is alert and oriented to person, place, and time.  Skin: Skin is warm, dry and intact.  Psychiatric: He has a normal mood and affect. His behavior is normal. Judgment and thought content normal.    Assessment:   1. Annual physical exam   2. Screening for cardiovascular, respiratory, and genitourinary diseases   3. Screening for prostate cancer   4. Essential hypertension   5. Type 2 diabetes mellitus with diabetic dermatitis, without long-term current use of insulin (HCC)   6. Erectile dysfunction, unspecified erectile dysfunction type   7. BPH associated with nocturia   8. Polypharmacy   9. Vitamin D deficiency   10. Nasal turbinate hypertrophy   11. Hypersomnia with sleep apnea   12. Morbid obesity due to excess calories (HCC)   13. Mixed hyperlipidemia   14. Chronic pain syndrome   15. Opioid type dependence, continuous (HCC)   16. 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    Orders Placed This Encounter  Procedures  . Pneumococcal conjugate vaccine 13-valent IM  . Comprehensive metabolic panel  . VITAMIN D 25 Hydroxy (Vit-D Deficiency, Fractures)  . Microalbumin/Creatinine Ratio, Urine  . CBC with Differential/Platelet  . PSA  . TestT+TestF+SHBG    Standing Status:   Future    Number of Occurrences:   1    Standing Expiration Date:   05/15/2019  . POCT glycosylated hemoglobin (Hb A1C)  . POCT urinalysis dipstick    Meds ordered this encounter  Medications  . DISCONTD: Zoster Vaccine Adjuvanted [facility]) injection    Sig: Inject 0.5 mLs into the muscle once for 1 dose.    Dispense:  0.5 mL    Refill:  0  . Zoster Vaccine  Adjuvanted [facility]) injection    Sig: Inject 0.5 mLs into the muscle once for 1 dose. Repeat once in 2-6 months    Dispense:  0.5 mL    Refill:  1  . HYDROcodone-acetaminophen (NORCO) 10-325 MG tablet    Sig: Take 1-2 tablets by mouth every 6 (six) hours as needed.    Dispense:  210 tablet    Refill:  0    For Chronic Pain Syndrome  . HYDROcodone-acetaminophen (NORCO) 10-325 MG tablet    Sig: Take 1-2 tablets by mouth every 6 (six) hours as needed for moderate pain.    Dispense:  210 tablet    Refill:  0    For Chronic Pain Syndrome  . HYDROcodone-acetaminophen (NORCO) 10-325 MG tablet    Sig: Take 1-2 tablets by mouth every 6 (six) hours as needed for  moderate pain.    Dispense:  210 tablet    Refill:  0    For Chronic Pain Syndrome  . amLODipine-olmesartan (AZOR) 5-20 MG tablet    Sig: Take 1 tablet by mouth daily.    Dispense:  90 tablet    Refill:  1    D/C amlodipine 5  . pravastatin (PRAVACHOL) 20 MG tablet    Sig: TAKE 1 TABLET BY MOUTH EVERY DAY AT BEDTIME    Dispense:  90 tablet    Refill:  1  . sildenafil (REVATIO) 20 MG tablet    Sig: Take 2 tablets (40 mg total) by mouth as needed. 30 minutes prior to activity no more than every 24 hours    Dispense:  30 tablet    Refill:  3   I personally performed the services described in this documentation, which was scribed in my presence. The recorded information has been reviewed and considered, and addended by me as needed.   [name], M.D.  Primary Care at [facility] [address] [place], [place] [address] [phone] phone [phone] fax  08/06/18 4:01 AM

## 2018-08-16 ENCOUNTER — Telehealth: Payer: Self-pay | Admitting: Family Medicine

## 2018-08-16 ENCOUNTER — Ambulatory Visit: Payer: Medicare Other | Admitting: Family Medicine

## 2018-08-16 NOTE — Telephone Encounter (Signed)
Called and spoke with pt. We were able to move their appt from 2:40 to 2:30 on 09/13/18 with Dr. Clelia Croft. I advised pt of time, building and late policy. Pt acknowledged

## 2018-08-20 ENCOUNTER — Ambulatory Visit: Payer: Medicare Other | Admitting: Family Medicine

## 2018-08-20 ENCOUNTER — Other Ambulatory Visit: Payer: Self-pay | Admitting: Family Medicine

## 2018-08-20 NOTE — Telephone Encounter (Signed)
Requested medication (s) are due for refill today:   Requested medication (s) are on the active medication list: yes  Last refill:  07/31/18 #210  Future visit scheduled: yes  Notes to clinic:      Requested Prescriptions  Pending Prescriptions Disp Refills   HYDROcodone-acetaminophen (NORCO) 10-325 MG tablet 210 tablet 0    Sig: Take 1-2 tablets by mouth every 6 (six) hours as needed.     Not Delegated - Analgesics:  Opioid Agonist Combinations Failed - 08/20/2018 12:57 PM      Failed - This refill cannot be delegated      Failed - Urine Drug Screen completed in last 360 days.      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

## 2018-08-20 NOTE — Telephone Encounter (Signed)
Copied from CRM [id number]. Topic: Quick Communication - Rx Refill/Question >> Aug 20, 2018 11:11 AM Maye Hides wrote: Medication: HYDROcodone-acetaminophen (NORCO) 10-325 MG tablet   Has the patient contacted their pharmacy? yes (Agent: If no, request that the patient contact the pharmacy for the refill.) (Agent: If yes, when and what did the pharmacy advise?)  Preferred Pharmacy (with phone number or street name): [id number] - [place], [place] - [address] AT [address] [facility] [address] [phone] (Phone) [phone] (Fax)     Agent: Please be advised that RX refills may take up to 3 business days. We ask that you follow-up with your pharmacy.

## 2018-08-23 NOTE — Telephone Encounter (Signed)
PATIENT WALKED IN FOR A MED REFILL ON HIS HYDROCODONE/ [facility] [address] / 10/24-

## 2018-08-24 MED ORDER — HYDROCODONE-ACETAMINOPHEN 10-325 MG PO TABS: 1.0000 | ORAL_TABLET | Freq: Four times a day (QID) | ORAL | 0 refills | Status: DC | PRN

## 2018-08-24 NOTE — Telephone Encounter (Signed)
Pt gets a medication refill on the first day of every month.  He has a visit with me on 11/14 which he willl need to keep for further refills. Filled last rxs on 8/1, 9/1, and 10/1.  [facility] [address] [facility] corner [facility] - he will be able to fill this ON OR AFTER 11/1.  Today I have utilized the [place] Controlled Substance Registry's online query to confirm compliance regarding the patient's controlled medications. My review reveals appropriate prescription fills and that I am the sole provider of these medications. Rechecks will occur regularly and the patient is aware of our use of the system.

## 2018-08-24 NOTE — Telephone Encounter (Signed)
Accidentally printed but can shred - resent electronically

## 2018-08-27 ENCOUNTER — Encounter: Payer: Self-pay | Admitting: Family Medicine

## 2018-09-11 ENCOUNTER — Ambulatory Visit (INDEPENDENT_AMBULATORY_CARE_PROVIDER_SITE_OTHER): Payer: Medicare Other | Admitting: Family Medicine

## 2018-09-11 ENCOUNTER — Telehealth: Payer: Self-pay | Admitting: Family Medicine

## 2018-09-11 DIAGNOSIS — R7989 Other specified abnormal findings of blood chemistry: Secondary | ICD-10-CM

## 2018-09-11 DIAGNOSIS — R203 Hyperesthesia: Secondary | ICD-10-CM

## 2018-09-11 DIAGNOSIS — Z79899 Other long term (current) drug therapy: Secondary | ICD-10-CM | POA: Insufficient documentation

## 2018-09-11 DIAGNOSIS — M791 Myalgia, unspecified site: Secondary | ICD-10-CM

## 2018-09-11 HISTORY — DX: Other long term (current) drug therapy: Z79.899

## 2018-09-11 HISTORY — DX: Hyperesthesia: R20.3

## 2018-09-11 HISTORY — DX: Myalgia, unspecified site: M79.10

## 2018-09-11 NOTE — Telephone Encounter (Signed)
Pt has testosterone drawn this a.m. If possible would like to add in: cmp - htn I10, T2DM E11.9, high risk meds Z79.899 Lipid - HLD E78.5 Vit D level - Dx code Vit D def E55.9 Vitamin B12 level hypersomnia G47.10, G47.30, G89.4, Z79.899, N52.9, R53.83, M79.10, R20.3 (let me know if you need others) TSH - E11.9, E78.5 (or any above) Hgba1c: Dx cose Type 2 DM E11.9 Cbc (don't care if w/ diff or w/o) - Z79.899  The LIPID PANEL (OR if can't due whole panel at least a LDL-Direct) and AT LEAST A CREATININE/BMP ARE MOST IMPORTANT Hgba1c and cbc are least important since we could aways run that in the office at this visit this Thursday. Thanks, Carley Hammed

## 2018-09-11 NOTE — Progress Notes (Signed)
Lab only visit. Pt not seen.

## 2018-09-13 ENCOUNTER — Encounter: Payer: Self-pay | Admitting: Family Medicine

## 2018-09-13 ENCOUNTER — Other Ambulatory Visit: Payer: Self-pay

## 2018-09-13 ENCOUNTER — Ambulatory Visit: Payer: Medicare Other | Admitting: Family Medicine

## 2018-09-13 VITALS — BP 145/78 | HR 73 | Temp 98.0°F | Resp 16 | Ht 67.91 in | Wt 243.0 lb

## 2018-09-13 DIAGNOSIS — R7989 Other specified abnormal findings of blood chemistry: Secondary | ICD-10-CM | POA: Diagnosis not present

## 2018-09-13 DIAGNOSIS — E782 Mixed hyperlipidemia: Secondary | ICD-10-CM

## 2018-09-13 DIAGNOSIS — K4090 Unilateral inguinal hernia, without obstruction or gangrene, not specified as recurrent: Secondary | ICD-10-CM | POA: Diagnosis not present

## 2018-09-13 DIAGNOSIS — E11620 Type 2 diabetes mellitus with diabetic dermatitis: Secondary | ICD-10-CM | POA: Diagnosis not present

## 2018-09-13 DIAGNOSIS — Z0001 Encounter for general adult medical examination with abnormal findings: Secondary | ICD-10-CM | POA: Diagnosis not present

## 2018-09-13 DIAGNOSIS — Z0000 Encounter for general adult medical examination without abnormal findings: Secondary | ICD-10-CM

## 2018-09-13 DIAGNOSIS — E559 Vitamin D deficiency, unspecified: Secondary | ICD-10-CM

## 2018-09-13 DIAGNOSIS — Z79899 Other long term (current) drug therapy: Secondary | ICD-10-CM

## 2018-09-13 DIAGNOSIS — G894 Chronic pain syndrome: Secondary | ICD-10-CM

## 2018-09-13 DIAGNOSIS — F1120 Opioid dependence, uncomplicated: Secondary | ICD-10-CM

## 2018-09-13 LAB — TESTT+TESTF+SHBG
SEX HORMONE BINDING: 22.1 nmol/L (ref 19.3–76.4)
TESTOSTERONE FREE: 7.8 pg/mL (ref 6.6–18.1)
TESTOSTERONE, TOTAL: 249.7 ng/dL — AB (ref 264.0–916.0)

## 2018-09-13 LAB — POCT CBC
Granulocyte percent: 53.1 %G (ref 37–80)
HEMATOCRIT: 44.2 % — AB (ref 29–41)
HEMOGLOBIN: 14.3 g/dL — AB (ref 9.5–13.5)
LYMPH, POC: 1.9 (ref 0.6–3.4)
MCH, POC: 26.7 pg — AB (ref 27–31.2)
MCHC: 32.3 g/dL (ref 31.8–35.4)
MCV: 82.8 fL (ref 76–111)
MID (cbc): 0.3 (ref 0–0.9)
MPV: 8.5 fL (ref 0–99.8)
POC GRANULOCYTE: 2.5 (ref 2–6.9)
POC LYMPH PERCENT: 40.6 %L (ref 10–50)
POC MID %: 6.3 %M (ref 0–12)
Platelet Count, POC: 226 10*3/uL (ref 142–424)
RBC: 5.33 M/uL (ref 4.69–6.13)
RDW, POC: 15.4 %
WBC: 4.8 10*3/uL (ref 4.6–10.2)

## 2018-09-13 LAB — POCT GLYCOSYLATED HEMOGLOBIN (HGB A1C): HEMOGLOBIN A1C: 6.1 % — AB (ref 4.0–5.6)

## 2018-09-13 MED ORDER — ZOSTER VAC RECOMB ADJUVANTED 50 MCG/0.5ML IM SUSR: 0.5000 mL | Freq: Once | INTRAMUSCULAR | 1 refills | Status: AC

## 2018-09-13 MED ORDER — TESTOSTERONE 2 MG/24HR TD PT24: 1.0000 | MEDICATED_PATCH | Freq: Every day | TRANSDERMAL | 2 refills | Status: DC

## 2018-09-13 MED ORDER — PRAVASTATIN SODIUM 40 MG PO TABS: 40.0000 mg | ORAL_TABLET | Freq: Every day | ORAL | 1 refills | Status: DC

## 2018-09-13 NOTE — Progress Notes (Addendum)
Subjective:   Troy Obrien is a 67 y.o. male who presents for a Welcome to Medicare exam.   Review of Systems: Not taking the doxazosin and denies any protstate sxs and no nocutria as long as cuts off drinking HTN: has home BP cuff at home.        Objective:    Today's Vitals   09/13/18 1458  BP: (!) 145/78  Pulse: 73  Resp: 16  Temp: 98 F (36.7 C)  TempSrc: Oral  SpO2: 97%  Weight: 243 lb (110.2 kg)  Height: 5' 7.91" (1.725 m)   Body mass index is 37.04 kg/m.  Medications Outpatient Encounter Medications as of 09/13/2018  Medication Sig  . amLODipine-olmesartan (AZOR) 5-20 MG tablet Take 1 tablet by mouth daily.  Marland Kitchen aspirin 81 MG tablet Take 81 mg by mouth daily.  Marland Kitchen azelastine (ASTELIN) 0.1 % nasal spray Place 1 spray into both nostrils 2 (two) times daily. Use in each nostril as directed  . brimonidine (ALPHAGAN) 0.2 % ophthalmic solution Place 1 drop into both eyes 2 (two) times daily.  . dorzolamide-timolol (COSOPT) 22.3-6.8 MG/ML ophthalmic solution Place 1 drop into both eyes 2 (two) times daily.  Marland Kitchen doxazosin (CARDURA) 4 MG tablet TAKE 1/2 TABLET BY MOUTH EVERY MORNING AND 1 TABLET DAILY AT BEDTIME  . HYDROcodone-acetaminophen (NORCO) 10-325 MG tablet Take 1-2 tablets by mouth every 6 (six) hours as needed.  Marland Kitchen HYDROcodone-acetaminophen (NORCO) 10-325 MG tablet Take 1-2 tablets by mouth every 6 (six) hours as needed for moderate pain.  Marland Kitchen HYDROcodone-acetaminophen (NORCO) 10-325 MG tablet Take 1-2 tablets by mouth every 6 (six) hours as needed for moderate pain.  Marland Kitchen ipratropium (ATROVENT) 0.03 % nasal spray Place 2 sprays into the nose 4 (four) times daily.  Marland Kitchen latanoprost (XALATAN) 0.005 % ophthalmic solution Place 1 drop into both eyes at bedtime.  . meclizine (ANTIVERT) 12.5 MG tablet Take 1 tablet (12.5 mg total) by mouth 3 (three) times daily as needed for dizziness.  . pravastatin (PRAVACHOL) 20 MG tablet TAKE 1 TABLET BY MOUTH EVERY DAY AT BEDTIME  .  sildenafil (REVATIO) 20 MG tablet Take 2 tablets (40 mg total) by mouth as needed. 30 minutes prior to activity no more than every 24 hours  . sildenafil (REVATIO) 20 MG tablet Take 1, 2, or 3 tablets daily as needed for ED  . Vitamin D, Ergocalciferol, (DRISDOL) 50000 units CAPS capsule Take 1 capsule (50,000 Units total) by mouth every 7 (seven) days.   No facility-administered encounter medications on file as of 09/13/2018.      History: Past Medical History:  Diagnosis Date  . Arthritis   . Glaucoma   . Gout   . Headache(784.0)   . Hyperlipidemia   . Hypertension   . Inguinal hernia   . Rickets, fetal   . Vitamin D deficiency    Past Surgical History:  Procedure Laterality Date  . carpel tunnel     bilateral  . CIRCUMCISION  1972  . INGUINAL HERNIA REPAIR  10/11/2012   Procedure: HERNIA REPAIR INGUINAL ADULT;  Surgeon: Robyne Askew, MD;  Location: [facility] OR;  Service: General;  Laterality: Right;  . INSERTION OF MESH  10/11/2012   Procedure: INSERTION OF MESH;  Surgeon: Robyne Askew, MD;  Location: [facility] OR;  Service: General;  Laterality: Right;  . TONSILLECTOMY  1963    History reviewed. No pertinent family history. Social History   Occupational History  . Not on file  Tobacco Use  . Smoking status: Never Smoker  . Smokeless tobacco: Never Used  Substance and Sexual Activity  . Alcohol use: Yes    Alcohol/week: 0.0 standard drinks    Comment: rare  . Drug use: No  . Sexual activity: Yes    Birth control/protection: None   Tobacco Counseling Counseling given: Not Answered   Immunizations and Health Maintenance Immunization History  Administered Date(s) Administered  . Influenza Inj Mdck Quad Pf 08/16/2017  . Influenza, High Dose Seasonal PF 07/16/2018  . Influenza-Unspecified 09/22/2016, 07/31/2017  . Pneumococcal Conjugate-13 05/14/2018  . Tdap 03/26/2014   There are no preventive care reminders to display for this patient.  Activities of Daily  Living In your present state of health, do you have any difficulty performing the following activities: 09/13/2018  Hearing? N  Vision? N  Difficulty concentrating or making decisions? N  Walking or climbing stairs? N  Dressing or bathing? N  Doing errands, shopping? N  Some recent data might be hidden    Physical Exam  Physical Exam  Constitutional: he is oriented to person, place, and time. he appears well-developed and well-nourished. No distress.  HENT:  Head: Normocephalic and atraumatic.  Right Ear: Tympanic membrane, external ear and ear canal normal.  Left Ear: Tympanic membrane, external ear and ear canal normal.  Nose: Nose normal. No mucosal edema or rhinorrhea.  Mouth/Throat: Uvula is midline, oropharynx is clear and moist and mucous membranes are normal. No posterior oropharyngeal erythema.  Eyes: Pupils are equal, round, and reactive to light. Conjunctivae and EOM are normal. Right eye exhibits no discharge. Left eye exhibits no discharge. No scleral icterus.  Neck: Normal range of motion. Neck supple. No thyromegaly present.  Cardiovascular: Normal rate, regular rhythm, normal heart sounds and intact distal pulses.  Pulmonary/Chest: Effort normal and breath sounds normal. No respiratory distress.  Abdominal: Soft. Bowel sounds are normal. There is no tenderness.  Musculoskeletal: he exhibits no edema.  Lymphadenopathy:    he has no cervical adenopathy.  Neurological: he is alert and oriented to person, place, and time. he has normal reflexes.  Skin: Skin is warm and dry. he is not diaphoretic. No erythema.  Psychiatric: Normal mood and affect and  behavior.    Advanced Directives: Does Patient Have a Medical Advance Directive?: No Does patient want to make changes to medical advance directive?: No - Patient declined    Assessment:    This is a routine wellness  examination for this patient .  1. Welcome to Medicare preventive visit   2. Type 2 diabetes mellitus  with diabetic dermatitis, without long-term current use of insulin (HCC)   3. 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 Vitamin D deficiency   5. Left inguinal hernia   6. Mixed hyperlipidemia   7. High risk medication use   8. Chronic pain syndrome   9. Opioid type dependence, continuous (HCC)      Vision/Hearing screen  Visual Acuity Screening   Right eye Left eye Both eyes  Without correction:     With correction: 20/20 20/20 20/20     Dietary issues and exercise activities discussed:     Goals   None     Depression Screen PHQ 2/9 Scores 09/13/2018 07/11/2018 05/14/2018 02/12/2018  PHQ - 2 Score 2 0 0 0  PHQ- 9 Score 4 - - -     Fall Risk Fall Risk  09/13/2018  Falls in the past year? 0  Comment -  Number  falls in past yr: 0  Injury with Fall? 0    Cognitive Function     6CIT Screen 09/13/2018  What Year? 0 points  What month? 0 points  What time? 0 points  Count back from 20 0 points  Months in reverse 0 points  Repeat phrase 0 points  Total Score 0    Patient Care Team: Sherren Mocha, MD as PCP - General (Family Medicine)    EKG: Sinus bradycardia HR 49, no acute ischemic changes noted. Only significant change noted when compared to prior EKG done 10/28/2013 is possible increase in atrial enlargement.   I have personally reviewed the EKG tracing and agree with the computer interpretation. Marked sinus  Bradycardia  -  Nonspecific T-abnormality.   ABNORMAL  Plan:  Today I have utilized the [place] Controlled Substance Registry's online query to confirm compliance regarding the patient's controlled medications. My review reveals appropriate prescription fills and that I am the sole provider of these medications. Rechecks will occur regularly and the patient is aware of our use of the system.  Filled hycrodone on 9/1, 10/1, 11/1  Orders Placed This Encounter  Procedures  . VITAMIN D 25 Hydroxy (Vit-D Deficiency, Fractures)  . ToxASSURE Select 13 (MW), Urine   . Ambulatory referral to General Surgery    Referral Priority:   Routine    Referral Type:   Surgical    Referral Reason:   Specialty Services Required    Referred to Provider:   Griselda Miner, MD    Requested Specialty:   General Surgery    Number of Visits Requested:   1  . POCT CBC  . POCT glycosylated hemoglobin (Hb A1C)  . EKG 12-Lead   Meds ordered this encounter  Medications  . Testosterone 2 MG/24HR PT24    Sig: Place 1 patch onto the skin daily.    Dispense:  30 patch    Refill:  2  . pravastatin (PRAVACHOL) 40 MG tablet    Sig: Take 1 tablet (40 mg total) by mouth daily. TAKE 1 TABLET BY MOUTH EVERY DAY AT BEDTIME    Dispense:  90 tablet    Refill:  1  . Zoster Vaccine Adjuvanted [facility]) injection    Sig: Inject 0.5 mLs into the muscle once for 1 dose. Repeat once in 2 to 6 mos    Dispense:  0.5 mL    Refill:  1  . HYDROcodone-acetaminophen (NORCO) 10-325 MG tablet    Sig: Take 1-2 tablets by mouth every 6 (six) hours as needed.    Dispense:  210 tablet    Refill:  0    For Chronic Pain Syndrome  . HYDROcodone-acetaminophen (NORCO) 10-325 MG tablet    Sig: Take 1-2 tablets by mouth every 6 (six) hours as needed for moderate pain.    Dispense:  210 tablet    Refill:  0    For Chronic Pain Syndrome  . HYDROcodone-acetaminophen (NORCO) 10-325 MG tablet    Sig: Take 1-2 tablets by mouth every 6 (six) hours as needed for moderate pain.    Dispense:  210 tablet    Refill:  0    For Chronic Pain Syndrome  . amLODipine-olmesartan (AZOR) 5-20 MG tablet    Sig: Take 0.5 tablets by mouth daily.    Dispense:  90 tablet    Refill:  0    D/C amlodipine 5  . testosterone cypionate (DEPOTESTOSTERONE CYPIONATE) 200 MG/ML injection    Sig: Inject 0.5 mLs (  100 mg total) into the muscle every 28 (twenty-eight) days.    Dispense:  1 mL    Refill:  2    D/C prior rx for 2mg  PATCHES - pt elects to try injectable    I have personally reviewed and noted the following in the  patient's chart:   . Medical and social history . Use of alcohol, tobacco or illicit drugs  . Current medications and supplements . Functional ability and status . Nutritional status . Physical activity . Advanced directives . List of other physicians . Hospitalizations, surgeries, and ER visits in previous 12 months . Vitals . Screenings to include cognitive, depression, and falls . Referrals and appointments  In addition, I have reviewed and discussed with patient certain preventive protocols, quality metrics, and best practice recommendations. A written personalized care plan for preventive services as well as general preventive health recommendations were provided to patient.    Sherren Mocha, MD 09/13/2018

## 2018-09-13 NOTE — Patient Instructions (Addendum)
Restart 1/2 tab of amlodipine-olmesartan and start checking your blood pressure in 2 weeks.   Once the high dose vitamin D is completed, then start an over the counter supplement of 2000u/d.   Stop the doxazosin for good.     If you have lab work done today you will be contacted with your lab results within the next 2 weeks.  If you have not heard from [place] then please contact us. The fastest way to get your results is to register for My Chart.   IF you received an x-ray today, you will receive an invoice from [facility]. Please contact [facility] at [phone] with questions or concerns regarding your invoice.   IF you received labwork today, you will receive an invoice from [place]. Please contact [facility] at [phone] with questions or concerns regarding your invoice.   Our billing staff will not be able to assist you with questions regarding bills from these companies.  You will be contacted with the lab results as soon as they are available. The fastest way to get your results is to activate your My Chart account. Instructions are located on the last page of this paperwork. If you have not heard from [place] regarding the results in 2 weeks, please contact this office.      Low-Sodium Eating Plan Sodium, which is an element that makes up salt, helps you maintain a healthy balance of fluids in your body. Too much sodium can increase your blood pressure and cause fluid and waste to be held in your body. Your health care provider or dietitian may recommend following this plan if you have high blood pressure (hypertension), kidney disease, liver disease, or heart failure. Eating less sodium can help lower your blood pressure, reduce swelling, and protect your heart, liver, and kidneys. What are tips for following this plan? General guidelines  Most people on this plan should limit their sodium intake to 1,500-2,000 mg (milligrams) of sodium each day. Reading food  labels  The Nutrition Facts label lists the amount of sodium in one serving of the food. If you eat more than one serving, you must multiply the listed amount of sodium by the number of servings.  Choose foods with less than 140 mg of sodium per serving.  Avoid foods with 300 mg of sodium or more per serving. Shopping  Look for lower-sodium products, often labeled as "low-sodium" or "no salt added."  Always check the sodium content even if foods are labeled as "unsalted" or "no salt added".  Buy fresh foods. ? Avoid canned foods and premade or frozen meals. ? Avoid canned, cured, or processed meats  Buy breads that have less than 80 mg of sodium per slice. Cooking  Eat more home-cooked food and less restaurant, buffet, and fast food.  Avoid adding salt when cooking. Use salt-free seasonings or herbs instead of table salt or sea salt. Check with your health care provider or pharmacist before using salt substitutes.  Cook with plant-based oils, such as canola, sunflower, or olive oil. Meal planning  When eating at a restaurant, ask that your food be prepared with less salt or no salt, if possible.  Avoid foods that contain MSG (monosodium glutamate). MSG is sometimes added to [place] food, bouillon, and some canned foods. What foods are recommended? The items listed may not be a complete list. Talk with your dietitian about what dietary choices are best for you. Grains Low-sodium cereals, including oats, puffed wheat and rice, and shredded wheat. Low-sodium  crackers. Unsalted rice. Unsalted pasta. Low-sodium bread. Whole-grain breads and whole-grain pasta. Vegetables Fresh or frozen vegetables. "No salt added" canned vegetables. "No salt added" tomato sauce and paste. Low-sodium or reduced-sodium tomato and vegetable juice. Fruits Fresh, frozen, or canned fruit. Fruit juice. Meats and other protein foods Fresh or frozen (no salt added) meat, poultry, seafood, and fish. Low-sodium  canned tuna and salmon. Unsalted nuts. Dried peas, beans, and lentils without added salt. Unsalted canned beans. Eggs. Unsalted nut butters. Dairy Milk. Soy milk. Cheese that is naturally low in sodium, such as ricotta cheese, fresh mozzarella, or Swiss cheese Low-sodium or reduced-sodium cheese. Cream cheese. Yogurt. Fats and oils Unsalted butter. Unsalted margarine with no trans fat. Vegetable oils such as canola or olive oils. Seasonings and other foods Fresh and dried herbs and spices. Salt-free seasonings. Low-sodium mustard and ketchup. Sodium-free salad dressing. Sodium-free light mayonnaise. Fresh or refrigerated horseradish. Lemon juice. Vinegar. Homemade, reduced-sodium, or low-sodium soups. Unsalted popcorn and pretzels. Low-salt or salt-free chips. What foods are not recommended? The items listed may not be a complete list. Talk with your dietitian about what dietary choices are best for you. Grains Instant hot cereals. Bread stuffing, pancake, and biscuit mixes. Croutons. Seasoned rice or pasta mixes. Noodle soup cups. Boxed or frozen macaroni and cheese. Regular salted crackers. Self-rising flour. Vegetables Sauerkraut, pickled vegetables, and relishes. Olives. [place] fries. Onion rings. Regular canned vegetables (not low-sodium or reduced-sodium). Regular canned tomato sauce and paste (not low-sodium or reduced-sodium). Regular tomato and vegetable juice (not low-sodium or reduced-sodium). Frozen vegetables in sauces. Meats and other protein foods Meat or fish that is salted, canned, smoked, spiced, or pickled. Bacon, ham, sausage, hotdogs, corned beef, chipped beef, packaged lunch meats, salt pork, jerky, pickled herring, anchovies, regular canned tuna, sardines, salted nuts. Dairy Processed cheese and cheese spreads. Cheese curds. Blue cheese. Feta cheese. String cheese. Regular cottage cheese. Buttermilk. Canned milk. Fats and oils Salted butter. Regular margarine. Ghee. Bacon  fat. Seasonings and other foods Onion salt, garlic salt, seasoned salt, table salt, and sea salt. Canned and packaged gravies. Worcestershire sauce. Tartar sauce. Barbecue sauce. Teriyaki sauce. Soy sauce, including reduced-sodium. Steak sauce. Fish sauce. Oyster sauce. Cocktail sauce. Horseradish that you find on the shelf. Regular ketchup and mustard. Meat flavorings and tenderizers. Bouillon cubes. Hot sauce and Tabasco sauce. Premade or packaged marinades. Premade or packaged taco seasonings. Relishes. Regular salad dressings. Salsa. Potato and tortilla chips. Corn chips and puffs. Salted popcorn and pretzels. Canned or dried soups. Pizza. Frozen entrees and pot pies. Summary  Eating less sodium can help lower your blood pressure, reduce swelling, and protect your heart, liver, and kidneys.  Most people on this plan should limit their sodium intake to 1,500-2,000 mg (milligrams) of sodium each day.  Canned, boxed, and frozen foods are high in sodium. Restaurant foods, fast foods, and pizza are also very high in sodium. You also get sodium by adding salt to food.  Try to cook at home, eat more fresh fruits and vegetables, and eat less fast food, canned, processed, or prepared foods. This information is not intended to replace advice given to you by your health care provider. Make sure you discuss any questions you have with your health care provider. Document Released: 04/08/2002 Document Revised: 10/10/2016 Document Reviewed: 10/10/2016 Elsevier Interactive Patient Education  2018 [employer].  DASH Eating Plan DASH stands for "Dietary Approaches to Stop Hypertension." The DASH eating plan is a healthy eating plan that has been shown to reduce  high blood pressure (hypertension). It may also reduce your risk for type 2 diabetes, heart disease, and stroke. The DASH eating plan may also help with weight loss. What are tips for following this plan? General guidelines  Avoid eating more than  2,300 mg (milligrams) of salt (sodium) a day. If you have hypertension, you may need to reduce your sodium intake to 1,500 mg a day.  Limit alcohol intake to no more than 1 drink a day for nonpregnant women and 2 drinks a day for men. One drink equals 12 oz of beer, 5 oz of wine, or 1 oz of hard liquor.  Work with your health care provider to maintain a healthy body weight or to lose weight. Ask what an ideal weight is for you.  Get at least 30 minutes of exercise that causes your heart to beat faster (aerobic exercise) most days of the week. Activities may include walking, swimming, or biking.  Work with your health care provider or diet and nutrition specialist (dietitian) to adjust your eating plan to your individual calorie needs. Reading food labels  Check food labels for the amount of sodium per serving. Choose foods with less than 5 percent of the Daily Value of sodium. Generally, foods with less than 300 mg of sodium per serving fit into this eating plan.  To find whole grains, look for the word "whole" as the first word in the ingredient list. Shopping  Buy products labeled as "low-sodium" or "no salt added."  Buy fresh foods. Avoid canned foods and premade or frozen meals. Cooking  Avoid adding salt when cooking. Use salt-free seasonings or herbs instead of table salt or sea salt. Check with your health care provider or pharmacist before using salt substitutes.  Do not fry foods. Cook foods using healthy methods such as baking, boiling, grilling, and broiling instead.  Cook with heart-healthy oils, such as olive, canola, soybean, or sunflower oil. Meal planning   Eat a balanced diet that includes: ? 5 or more servings of fruits and vegetables each day. At each meal, try to fill half of your plate with fruits and vegetables. ? Up to 6-8 servings of whole grains each day. ? Less than 6 oz of lean meat, poultry, or fish each day. A 3-oz serving of meat is about the same size  as a deck of cards. One egg equals 1 oz. ? 2 servings of low-fat dairy each day. ? A serving of nuts, seeds, or beans 5 times each week. ? Heart-healthy fats. Healthy fats called Omega-3 fatty acids are found in foods such as flaxseeds and coldwater fish, like sardines, salmon, and mackerel.  Limit how much you eat of the following: ? Canned or prepackaged foods. ? Food that is high in trans fat, such as fried foods. ? Food that is high in saturated fat, such as fatty meat. ? Sweets, desserts, sugary drinks, and other foods with added sugar. ? Full-fat dairy products.  Do not salt foods before eating.  Try to eat at least 2 vegetarian meals each week.  Eat more home-cooked food and less restaurant, buffet, and fast food.  When eating at a restaurant, ask that your food be prepared with less salt or no salt, if possible. What foods are recommended? The items listed may not be a complete list. Talk with your dietitian about what dietary choices are best for you. Grains Whole-grain or whole-wheat bread. Whole-grain or whole-wheat pasta. Brown rice. [place] Cobbatmeal. Quinoa. Bulgur. Whole-grain and low-sodium cereals. Pita  bread. Low-fat, low-sodium crackers. Whole-wheat flour tortillas. Vegetables Fresh or frozen vegetables (raw, steamed, roasted, or grilled). Low-sodium or reduced-sodium tomato and vegetable juice. Low-sodium or reduced-sodium tomato sauce and tomato paste. Low-sodium or reduced-sodium canned vegetables. Fruits All fresh, dried, or frozen fruit. Canned fruit in natural juice (without added sugar). Meat and other protein foods Skinless chicken or [place]. Ground chicken or [place]. Pork with fat trimmed off. Fish and seafood. Egg whites. Dried beans, peas, or lentils. Unsalted nuts, nut butters, and seeds. Unsalted canned beans. Lean cuts of beef with fat trimmed off. Low-sodium, lean deli meat. Dairy Low-fat (1%) or fat-free (skim) milk. Fat-free, low-fat, or reduced-fat cheeses.  Nonfat, low-sodium ricotta or cottage cheese. Low-fat or nonfat yogurt. Low-fat, low-sodium cheese. Fats and oils Soft margarine without trans fats. Vegetable oil. Low-fat, reduced-fat, or light mayonnaise and salad dressings (reduced-sodium). Canola, safflower, olive, soybean, and sunflower oils. Avocado. Seasoning and other foods Herbs. Spices. Seasoning mixes without salt. Unsalted popcorn and pretzels. Fat-free sweets. What foods are not recommended? The items listed may not be a complete list. Talk with your dietitian about what dietary choices are best for you. Grains Baked goods made with fat, such as croissants, muffins, or some breads. Dry pasta or rice meal packs. Vegetables Creamed or fried vegetables. Vegetables in a cheese sauce. Regular canned vegetables (not low-sodium or reduced-sodium). Regular canned tomato sauce and paste (not low-sodium or reduced-sodium). Regular tomato and vegetable juice (not low-sodium or reduced-sodium). Rosita Fire. Olives. Fruits Canned fruit in a light or heavy syrup. Fried fruit. Fruit in cream or butter sauce. Meat and other protein foods Fatty cuts of meat. Ribs. Fried meat. Tomasa Blase. Sausage. Bologna and other processed lunch meats. Salami. Fatback. Hotdogs. Bratwurst. Salted nuts and seeds. Canned beans with added salt. Canned or smoked fish. Whole eggs or egg yolks. Chicken or [place] with skin. Dairy Whole or 2% milk, cream, and half-and-half. Whole or full-fat cream cheese. Whole-fat or sweetened yogurt. Full-fat cheese. Nondairy creamers. Whipped toppings. Processed cheese and cheese spreads. Fats and oils Butter. Stick margarine. Lard. Shortening. Ghee. Bacon fat. Tropical oils, such as coconut, palm kernel, or palm oil. Seasoning and other foods Salted popcorn and pretzels. Onion salt, garlic salt, seasoned salt, table salt, and sea salt. Worcestershire sauce. Tartar sauce. Barbecue sauce. Teriyaki sauce. Soy sauce, including reduced-sodium. Steak  sauce. Canned and packaged gravies. Fish sauce. Oyster sauce. Cocktail sauce. Horseradish that you find on the shelf. Ketchup. Mustard. Meat flavorings and tenderizers. Bouillon cubes. Hot sauce and Tabasco sauce. Premade or packaged marinades. Premade or packaged taco seasonings. Relishes. Regular salad dressings. Where to find more information:  National Heart, Lung, and Blood Institute: PopSteam.is  American Heart Association: www.heart.org Summary  The DASH eating plan is a healthy eating plan that has been shown to reduce high blood pressure (hypertension). It may also reduce your risk for type 2 diabetes, heart disease, and stroke.  With the DASH eating plan, you should limit salt (sodium) intake to 2,300 mg a day. If you have hypertension, you may need to reduce your sodium intake to 1,500 mg a day.  When on the DASH eating plan, aim to eat more fresh fruits and vegetables, whole grains, lean proteins, low-fat dairy, and heart-healthy fats.  Work with your health care provider or diet and nutrition specialist (dietitian) to adjust your eating plan to your individual calorie needs. This information is not intended to replace advice given to you by your health care provider. Make sure you discuss any questions you  have with your health care provider. Document Released: 10/06/2011 Document Revised: 10/10/2016 Document Reviewed: 10/10/2016 Elsevier Interactive Patient Education  [employer].

## 2018-09-14 ENCOUNTER — Ambulatory Visit (INDEPENDENT_AMBULATORY_CARE_PROVIDER_SITE_OTHER): Payer: Medicare Other | Admitting: Family Medicine

## 2018-09-14 DIAGNOSIS — E291 Testicular hypofunction: Secondary | ICD-10-CM | POA: Diagnosis not present

## 2018-09-14 LAB — VITAMIN D 25 HYDROXY (VIT D DEFICIENCY, FRACTURES): VIT D 25 HYDROXY: 26.8 ng/mL — AB (ref 30.0–100.0)

## 2018-09-14 MED ORDER — AMLODIPINE-OLMESARTAN 5-20 MG PO TABS: 0.5000 | ORAL_TABLET | Freq: Every day | ORAL | 0 refills | Status: DC

## 2018-09-14 MED ORDER — TESTOSTERONE CYPIONATE 200 MG/ML IM SOLN
100.0000 mg | Freq: Once | INTRAMUSCULAR | Status: AC
  Administered 2018-09-14: 100 mg via INTRAMUSCULAR

## 2018-09-14 MED ORDER — HYDROCODONE-ACETAMINOPHEN 10-325 MG PO TABS: 1.0000 | ORAL_TABLET | Freq: Four times a day (QID) | ORAL | 0 refills | Status: AC | PRN

## 2018-09-14 MED ORDER — HYDROCODONE-ACETAMINOPHEN 10-325 MG PO TABS: 1.0000 | ORAL_TABLET | Freq: Four times a day (QID) | ORAL | 0 refills | Status: DC | PRN

## 2018-09-14 MED ORDER — TESTOSTERONE CYPIONATE 200 MG/ML IM SOLN: 100.0000 mg | INTRAMUSCULAR | 2 refills | Status: DC

## 2018-09-14 NOTE — Patient Instructions (Signed)
Pt here for Testosterone Cypionate 200 mg/ML 0.5cc IM  given in right upper outer quadrant.

## 2018-09-15 LAB — COMPREHENSIVE METABOLIC PANEL
A/G RATIO: 1.5 (ref 1.2–2.2)
ALK PHOS: 45 IU/L (ref 39–117)
ALT: 17 IU/L (ref 0–44)
AST: 14 IU/L (ref 0–40)
Albumin: 4.1 g/dL (ref 3.6–4.8)
BILIRUBIN TOTAL: 0.3 mg/dL (ref 0.0–1.2)
BUN/Creatinine Ratio: 14 (ref 10–24)
BUN: 15 mg/dL (ref 8–27)
CHLORIDE: 103 mmol/L (ref 96–106)
CO2: 18 mmol/L — ABNORMAL LOW (ref 20–29)
Calcium: 9.3 mg/dL (ref 8.6–10.2)
Creatinine, Ser: 1.07 mg/dL (ref 0.76–1.27)
GFR calc non Af Amer: 72 mL/min/{1.73_m2} (ref >59)
GFR, EST AFRICAN AMERICAN: 84 mL/min/{1.73_m2} (ref >59)
GLUCOSE: 106 mg/dL — AB (ref 65–99)
Globulin, Total: 2.7 g/dL (ref 1.5–4.5)
POTASSIUM: 4.4 mmol/L (ref 3.5–5.2)
Sodium: 139 mmol/L (ref 134–144)
TOTAL PROTEIN: 6.8 g/dL (ref 6.0–8.5)

## 2018-09-15 LAB — SPECIMEN STATUS REPORT

## 2018-09-15 LAB — LDL CHOLESTEROL, DIRECT: LDL DIRECT: 114 mg/dL — AB (ref 0–99)

## 2018-09-16 NOTE — Progress Notes (Signed)
CMA visit for initial testosterone injection of 100mg  IM q mo. RTC for next in 1 mo. F/u in OV 2 wks after 3rd injection for labs

## 2018-09-20 LAB — TOXASSURE SELECT 13 (MW), URINE

## 2018-10-01 ENCOUNTER — Telehealth: Payer: Self-pay | Admitting: Family Medicine

## 2018-10-01 NOTE — Telephone Encounter (Unsigned)
Copied from CRM [phone]. Topic: Quick Communication - Rx Refill/Question >> Oct 01, 2018 11:45 AM Jilda Rocheemaray, Melissa wrote: Medication: testosterone cypionate (DEPOTESTOSTERONE CYPIONATE) 200 MG/ML injection    Sig - Route: Inject 0.5 mLs (100 mg total) into the muscle every 28 (twenty-eight) days. - Intramuscular  Sent to pharmacy as: testosterone cypionate (DEPOTESTOSTERONE CYPIONATE) 200 MG/ML injection  Notes to Pharmacy: D/C prior rx for 2mg  PATCHES - pt elects to try injectable     Has the patient contacted their pharmacy? No. (Agent: If no, request that the patient contact the pharmacy for the refill.) (Agent: If yes, when and what did the pharmacy advise?)  Preferred Pharmacy (with phone number or street name): [id number] - [place], [place] - [phone] (Phone) [phone] (Fax)  Call back is [phone]  Agent: Please be advised that RX refills may take up to 3 business days. We ask that you follow-up with your pharmacy.

## 2018-10-01 NOTE — Telephone Encounter (Signed)
See note

## 2018-10-11 ENCOUNTER — Ambulatory Visit (INDEPENDENT_AMBULATORY_CARE_PROVIDER_SITE_OTHER): Payer: Medicare Other | Admitting: Family Medicine

## 2018-10-11 ENCOUNTER — Other Ambulatory Visit: Payer: Self-pay | Admitting: Family Medicine

## 2018-10-11 DIAGNOSIS — E291 Testicular hypofunction: Secondary | ICD-10-CM

## 2018-10-11 DIAGNOSIS — Z79899 Other long term (current) drug therapy: Secondary | ICD-10-CM

## 2018-10-11 NOTE — Progress Notes (Signed)
Pt receiving second dose of testosterone 0.5 today. Can come into office in 2 weeks for lab only visit to check levels as would be 2 weeks after q mo T injection and 6 weeks after starting.

## 2018-10-25 ENCOUNTER — Ambulatory Visit (INDEPENDENT_AMBULATORY_CARE_PROVIDER_SITE_OTHER): Payer: Medicare Other | Admitting: Family Medicine

## 2018-10-25 DIAGNOSIS — Z79899 Other long term (current) drug therapy: Secondary | ICD-10-CM

## 2018-10-25 NOTE — Progress Notes (Signed)
Nurse visit for lab only 

## 2018-10-26 LAB — CBC WITH DIFFERENTIAL/PLATELET
BASOS: 1 %
Basophils Absolute: 0 10*3/uL (ref 0.0–0.2)
EOS (ABSOLUTE): 0.2 10*3/uL (ref 0.0–0.4)
EOS: 4 %
HEMATOCRIT: 44.8 % (ref 37.5–51.0)
HEMOGLOBIN: 14.6 g/dL (ref 13.0–17.7)
Immature Grans (Abs): 0 10*3/uL (ref 0.0–0.1)
Immature Granulocytes: 0 %
LYMPHS ABS: 1.7 10*3/uL (ref 0.7–3.1)
Lymphs: 31 %
MCH: 26.7 pg (ref 26.6–33.0)
MCHC: 32.6 g/dL (ref 31.5–35.7)
MCV: 82 fL (ref 79–97)
MONOCYTES: 14 %
Monocytes Absolute: 0.7 10*3/uL (ref 0.1–0.9)
Neutrophils Absolute: 2.7 10*3/uL (ref 1.4–7.0)
Neutrophils: 50 %
Platelets: 276 10*3/uL (ref 150–450)
RBC: 5.47 x10E6/uL (ref 4.14–5.80)
RDW: 13.5 % (ref 12.3–15.4)
WBC: 5.3 10*3/uL (ref 3.4–10.8)

## 2018-10-26 LAB — COMPREHENSIVE METABOLIC PANEL
ALBUMIN: 4.3 g/dL (ref 3.6–4.8)
ALK PHOS: 44 IU/L (ref 39–117)
ALT: 15 IU/L (ref 0–44)
AST: 19 IU/L (ref 0–40)
Albumin/Globulin Ratio: 1.5 (ref 1.2–2.2)
BUN / CREAT RATIO: 8 — AB (ref 10–24)
BUN: 11 mg/dL (ref 8–27)
Bilirubin Total: 0.3 mg/dL (ref 0.0–1.2)
CO2: 23 mmol/L (ref 20–29)
CREATININE: 1.35 mg/dL — AB (ref 0.76–1.27)
Calcium: 9.3 mg/dL (ref 8.6–10.2)
Chloride: 101 mmol/L (ref 96–106)
GFR calc Af Amer: 63 mL/min/{1.73_m2} (ref >59)
GFR calc non Af Amer: 55 mL/min/{1.73_m2} — ABNORMAL LOW (ref >59)
GLOBULIN, TOTAL: 2.9 g/dL (ref 1.5–4.5)
Glucose: 117 mg/dL — ABNORMAL HIGH (ref 65–99)
Potassium: 4.9 mmol/L (ref 3.5–5.2)
SODIUM: 140 mmol/L (ref 134–144)
Total Protein: 7.2 g/dL (ref 6.0–8.5)

## 2018-10-26 LAB — TESTT+TESTF+SHBG
Sex Hormone Binding: 21.2 nmol/L (ref 19.3–76.4)
TESTOSTERONE, TOTAL, LC/MS: 300.2 ng/dL (ref 264.0–916.0)
Testosterone, Free: 11.9 pg/mL (ref 6.6–18.1)

## 2018-10-27 NOTE — Telephone Encounter (Addendum)
When pt was originally rx'd this on 11/15 I sent him in a 3-6 mo supply (depending on whether he discards bottle after use or saves and uses second half for the next mos dose - so even if discarding vial after 1 use pt should not need any refills until his injection due in early Feb  If first inj was 11/15 Next would be 12/13 and then 3rd fill on the rx would be used 1/10 Then pt will only need a new rx in time for his dose due 2/7 but he will need labs drawn 12/27 (exactly 6 wks after initial inj) or later but before refill is due in early Feb to ensure he is on the correct dose.  PDMP DOES CONFIRM THAT PT DID REFILL THE INITIAL TESTOSTERONE RX WRITTEN AND FILLED 11/15 ON 12/12 SO THIS

## 2018-10-29 NOTE — Telephone Encounter (Signed)
Patient was informed: When pt was originally rx'd this on 11/15 I sent him in a 3-6 mo supply (depending on whether he discards bottle after use or saves and uses second half for the next mos dose - so even if discarding vial after 1 use pt should not need any refills until his injection due in early Feb  If first inj was 11/15 Next would be 12/13 and then 3rd fill on the rx would be used 1/10 Then pt will only need a new rx in time for his dose due 2/7 but he will need labs drawn 12/27 (exactly 6 wks after initial inj) or later but before refill is due in early Feb to ensure he is on the correct dose.  PDMP DOES CONFIRM THAT PT DID REFILL THE INITIAL TESTOSTERONE RX WRITTEN AND FILLED 11/15 ON 12/12 SO THIS  Patient stated he has had his sec injection and he will need to look at his rx when he get home and if he had any further question he will give us a call back

## 2018-11-08 ENCOUNTER — Telehealth: Payer: Self-pay | Admitting: Family Medicine

## 2018-11-08 ENCOUNTER — Ambulatory Visit (INDEPENDENT_AMBULATORY_CARE_PROVIDER_SITE_OTHER): Payer: Medicare Other | Admitting: Emergency Medicine

## 2018-11-08 DIAGNOSIS — E291 Testicular hypofunction: Secondary | ICD-10-CM

## 2018-11-08 MED ORDER — TESTOSTERONE CYPIONATE 200 MG/ML IM SOLN
100.0000 mg | INTRAMUSCULAR | Status: DC
  Administered 2018-11-08 – 2019-01-02 (×3): 100 mg via INTRAMUSCULAR

## 2018-11-08 NOTE — Telephone Encounter (Signed)
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

## 2018-11-12 NOTE — Telephone Encounter (Signed)
Please advise 

## 2018-11-13 ENCOUNTER — Ambulatory Visit: Payer: Medicare Other | Admitting: Family Medicine

## 2018-11-16 ENCOUNTER — Telehealth: Payer: Self-pay | Admitting: Family Medicine

## 2018-11-16 NOTE — Telephone Encounter (Signed)
MyChart message sent to pt about their appointment on 12/05/18 with Dr Shaw. °

## 2018-11-20 NOTE — Telephone Encounter (Signed)
Pt's wife Britta Mccreedy stated a note is no longer needed. Please advise.

## 2018-11-25 ENCOUNTER — Other Ambulatory Visit: Payer: Self-pay | Admitting: Family Medicine

## 2018-12-05 ENCOUNTER — Ambulatory Visit: Payer: Medicare Other | Admitting: Family Medicine

## 2018-12-06 ENCOUNTER — Other Ambulatory Visit: Payer: Self-pay

## 2018-12-06 ENCOUNTER — Encounter: Payer: Self-pay | Admitting: Family Medicine

## 2018-12-06 ENCOUNTER — Ambulatory Visit: Payer: Medicare Other | Admitting: Family Medicine

## 2018-12-06 VITALS — BP 153/75 | HR 45 | Temp 97.7°F | Ht 67.91 in | Wt 240.0 lb

## 2018-12-06 DIAGNOSIS — I10 Essential (primary) hypertension: Secondary | ICD-10-CM | POA: Diagnosis not present

## 2018-12-06 DIAGNOSIS — M545 Low back pain: Secondary | ICD-10-CM

## 2018-12-06 DIAGNOSIS — R001 Bradycardia, unspecified: Secondary | ICD-10-CM | POA: Diagnosis not present

## 2018-12-06 DIAGNOSIS — Z79899 Other long term (current) drug therapy: Secondary | ICD-10-CM

## 2018-12-06 DIAGNOSIS — E291 Testicular hypofunction: Secondary | ICD-10-CM | POA: Diagnosis not present

## 2018-12-06 DIAGNOSIS — G894 Chronic pain syndrome: Secondary | ICD-10-CM | POA: Diagnosis not present

## 2018-12-06 DIAGNOSIS — G8929 Other chronic pain: Secondary | ICD-10-CM

## 2018-12-06 MED ORDER — HYDROCODONE-ACETAMINOPHEN 10-325 MG PO TABS: 1.0000 | ORAL_TABLET | Freq: Four times a day (QID) | ORAL | 0 refills | Status: DC | PRN

## 2018-12-06 MED ORDER — AMLODIPINE-OLMESARTAN 5-20 MG PO TABS: 1.0000 | ORAL_TABLET | Freq: Every day | ORAL | 0 refills | Status: DC

## 2018-12-06 NOTE — Patient Instructions (Addendum)
  Please have BP checked when you come back for next testosterone injection   If you have lab work done today you will be contacted with your lab results within the next 2 weeks.  If you have not heard from [place] then please contact us. The fastest way to get your results is to register for My Chart.   IF you received an x-ray today, you will receive an invoice from [facility]. Please contact [facility] at [phone] with questions or concerns regarding your invoice.   IF you received labwork today, you will receive an invoice from [place]. Please contact [facility] at [phone] with questions or concerns regarding your invoice.   Our billing staff will not be able to assist you with questions regarding bills from these companies.  You will be contacted with the lab results as soon as they are available. The fastest way to get your results is to activate your My Chart account. Instructions are located on the last page of this paperwork. If you have not heard from [place] regarding the results in 2 weeks, please contact this office.

## 2018-12-06 NOTE — Progress Notes (Signed)
2/6/20209:48 AM  Troy Obrien 1951-01-08, 68 y.o. male [id number]  Chief Complaint  Patient presents with  . Medication Refill    needs his pain pills for the month of March. has refills for this month. Needs testosterone injection    HPI:   Patient is a 68 y.o. male with past medical history significant for HTN, OSA, DM2, chronic back pain on opiates, ED, hypogonadism, HLP who presents today for  Routine followup  PCP Dr Clelia Croft Last OV nov 2019 Restarted BP meds and stopped dozasozin Patient does not see cards  Last testosterone, CBC done dec 2019 Last UDS done nov 2019 Last PSA July 2019 pmp reviewed  Today feeling ok Denies any lightheadness, dizzy, fatigue, tired, falls, SOB Tolerating BP meds fine, has been taking 1 tab since yesterday, prior to that had been wo meds for about 3 weeks Not checking BP at home Due for testosterone injection today Due for vicodin refills in march, takes for chronic back pain, takes vicodin every 2 hours, takes 7 tabs a day APAP 2275mg  a day, 70mg  MME a day Tolerating well Has done PT Has never had epidural injections  Lumbar spine xray in jan 2019: multilevel DDD Thoracic spine xray in jan 2019: Multilevel degenerative right-sided and anterior osteophytes of the thoracic spine compatible with thoracic spondylosis and possibly diffuse idiopathic skeletal hyperostosis. No acute osseous abnormality.   Lab Results  Component Value Date   HGBA1C 6.1 (A) 09/13/2018   HGBA1C 6.1 (A) 05/14/2018   HGBA1C 6.6 09/06/2017   Lab Results  Component Value Date   MICROALBUR 0.2 01/23/2016   LDLCALC 91 09/06/2017   CREATININE 1.35 (H) 10/25/2018   Fall Risk  12/06/2018 09/13/2018 07/11/2018 05/14/2018 02/12/2018  Falls in the past year? 0 0 No No No  Comment - - - - -  Number falls in past yr: 0 0 - - -  Injury with Fall? 0 0 - - -     Depression screen [facility] 2/9 12/06/2018 09/13/2018 07/11/2018  Decreased Interest 0 1 0  Down, Depressed,  Hopeless 0 1 0  PHQ - 2 Score 0 2 0  Altered sleeping - 0 -  Tired, decreased energy - 1 -  Change in appetite - 0 -  Feeling bad or failure about yourself  - 1 -  Trouble concentrating - 0 -  Moving slowly or fidgety/restless - 0 -  Suicidal thoughts - 0 -  PHQ-9 Score - 4 -    Allergies  Allergen Reactions  . Laxative [Bisacodyl] Hives  . Cyclobenzaprine Other (See Comments)    Hand twitching/spasms    Prior to Admission medications   Medication Sig Start Date End Date Taking? Authorizing Provider  amLODipine-olmesartan (AZOR) 5-20 MG tablet Take 0.5 tablets by mouth daily. 09/14/18  Yes Sherren Mocha, MD  aspirin 81 MG tablet Take 81 mg by mouth daily.   Yes [provider]  azelastine (ASTELIN) 0.1 % nasal spray Place 1 spray into both nostrils 2 (two) times daily. Use in each nostril as directed 02/12/18  Yes Sherren Mocha, MD  brimonidine [facility]) 0.2 % ophthalmic solution Place 1 drop into both eyes 2 (two) times daily. 11/18/17  Yes Bond, Doran Stabler, MD  dorzolamide-timolol (COSOPT) 22.3-6.8 MG/ML ophthalmic solution Place 1 drop into both eyes 2 (two) times daily. 01/24/18  Yes Bond, Doran Stabler, MD  HYDROcodone-acetaminophen [facility]) 10-325 MG tablet Take 1-2 tablets by mouth every 6 (six) hours as needed. 12/01/18  Yes  Sherren MochaShaw, Eva N, MD  HYDROcodone-acetaminophen [facility](NORCO) 10-325 MG tablet Take 1-2 tablets by mouth every 6 (six) hours as needed for moderate pain. 10/31/18  Yes Sherren MochaShaw, Eva N, MD  ipratropium (ATROVENT) 0.03 % nasal spray Place 2 sprays into the nose 4 (four) times daily. 02/12/18  Yes Sherren MochaShaw, Eva N, MD  latanoprost (XALATAN) 0.005 % ophthalmic solution Place 1 drop into both eyes at bedtime. 01/16/18  Yes Bond, Doran StablerJeffrey Brent, MD  pravastatin (PRAVACHOL) 40 MG tablet Take 1 tablet (40 mg total) by mouth daily. TAKE 1 TABLET BY MOUTH EVERY DAY AT BEDTIME 09/13/18  Yes Sherren MochaShaw, Eva N, MD  sildenafil (REVATIO) 20 MG tablet Take 2 tablets (40 mg total) by mouth as needed.  30 minutes prior to activity no more than every 24 hours 05/14/18  Yes Sherren MochaShaw, Eva N, MD  sildenafil (REVATIO) 20 MG tablet Take 1, 2, or 3 tablets daily as needed for ED 05/02/14  Yes [provider]  Testosterone 2 MG/24HR PT24 Place 1 patch onto the skin daily. 09/13/18  Yes Sherren MochaShaw, Eva N, MD  testosterone cypionate (DEPOTESTOSTERONE CYPIONATE) 200 MG/ML injection Inject 0.5 mLs (100 mg total) into the muscle every 28 (twenty-eight) days. 09/14/18  Yes Sherren MochaShaw, Eva N, MD  Vitamin D, Ergocalciferol, (DRISDOL) 50000 units CAPS capsule Take 1 capsule (50,000 Units total) by mouth every 7 (seven) days. 02/12/18  Yes Sherren MochaShaw, Eva N, MD    Past Medical History:  Diagnosis Date  . Arthritis   . Glaucoma   . Gout   . Headache(784.0)   . Hyperlipidemia   . Hypertension   . Inguinal hernia   . Rickets, fetal   . Vitamin D deficiency     Past Surgical History:  Procedure Laterality Date  . carpel tunnel     bilateral  . CIRCUMCISION  1972  . INGUINAL HERNIA REPAIR  10/11/2012   Procedure: HERNIA REPAIR INGUINAL ADULT;  Surgeon: Robyne AskewPaul S Toth III, MD;  [facility];  Service: General;  Laterality: Right;  . INSERTION OF MESH  10/11/2012   Procedure: INSERTION OF MESH;  Surgeon: Robyne AskewPaul S Toth III, MD;  [facility];  Service: General;  Laterality: Right;  . TONSILLECTOMY  1963    Social History   Tobacco Use  . Smoking status: Never Smoker  . Smokeless tobacco: Never Used  Substance Use Topics  . Alcohol use: Yes    Alcohol/week: 0.0 standard drinks    Comment: rare    History reviewed. No pertinent family history.  ROS Per hpi  OBJECTIVE:  Blood pressure (!) 153/75, pulse (!) 45, temperature 97.7 F (36.5 C), temperature source Oral, height 5' 7.91" (1.725 m), weight 240 lb (108.9 kg), SpO2 96 %. Body mass index is 36.59 kg/m.   BP Readings from Last 3 Encounters:  12/06/18 (!) 153/75  09/13/18 (!) 145/78  07/11/18 113/70   Pulse Readings from Last 3 Encounters:    12/06/18 (!) 45  09/13/18 73  07/11/18 (!) 50    Physical Exam Vitals signs and nursing note reviewed.  Constitutional:      Appearance: He is well-developed.  HENT:     Head: Normocephalic and atraumatic.  Eyes:     Conjunctiva/sclera: Conjunctivae normal.     Pupils: Pupils are equal, round, and reactive to light.  Neck:     Musculoskeletal: Neck supple.  Cardiovascular:     Rate and Rhythm: Normal rate and regular rhythm.     Heart sounds: No murmur. No friction rub.  No gallop.   Pulmonary:     Effort: Pulmonary effort is normal.     Breath sounds: Normal breath sounds. No wheezing or rales.  Skin:    General: Skin is warm and dry.  Neurological:     Mental Status: He is alert and oriented to person, place, and time.    My interpretation of EKG:  Sinus, HR 42, unchanged from nov 2019   ASSESSMENT and PLAN  1. Essential hypertension Uncontrolled. Patient has been off meds for about 3 weeks. Restarted yesterday. Continue with current regime. Recheck BP nurse visit with next testosterone injection. Continue with dietary changes.  2. Bradycardia Asymptomatic. Did not resolve with dc of doxazosin. Referring to cards for further eval and treatment. RTC precautions given - EKG 12-Lead - Ambulatory referral to Cardiology  3. Chronic left-sided low back pain without sciatica 4. Chronic pain syndrome Discussed with patient concerns for daily q2 hours use of medications. It does not seem that he has every had any other treatment modalities, other than PT. Referring to pain management for further eval and treat. - Ambulatory referral to Pain Clinic  5. Hypogonadism in male Last labs at goal. Injection given today. Continue current regime.   6. Encounter for long-term current use of high risk medication  Other orders - amLODipine-olmesartan (AZOR) 5-20 MG tablet; Take 1 tablet by mouth daily. - HYDROcodone-acetaminophen (NORCO) 10-325 MG tablet; Take 1-2 tablets by mouth  every 6 (six) hours as needed. - HYDROcodone-acetaminophen (NORCO) 10-325 MG tablet; Take 1-2 tablets by mouth every 6 (six) hours as needed for moderate pain.   Return in about 3 months (around 03/06/2019).    Myles Lipps, MD Primary Care at [facility] [address] [place], [place] [address] Ph.  [phone] Fax [phone]

## 2018-12-08 ENCOUNTER — Other Ambulatory Visit: Payer: Self-pay | Admitting: Family Medicine

## 2018-12-08 DIAGNOSIS — E11620 Type 2 diabetes mellitus with diabetic dermatitis: Secondary | ICD-10-CM

## 2018-12-08 DIAGNOSIS — E291 Testicular hypofunction: Secondary | ICD-10-CM

## 2018-12-08 DIAGNOSIS — Z79899 Other long term (current) drug therapy: Secondary | ICD-10-CM

## 2018-12-10 NOTE — Telephone Encounter (Signed)
Requested medication (s) are due for refill today: yes  Requested medication (s) are on the active medication list: yes  Last refill:  09/14/18  Future visit scheduled: yes  Notes to clinic:  Failed medication not assigned to protocol.  Requested Prescriptions  Pending Prescriptions Disp Refills   testosterone cypionate (DEPOTESTOSTERONE CYPIONATE) 200 MG/ML injection [Pharmacy Med Name: TESTOSTERONE CYP 200 MG/ML] 1 mL 2    Sig: Inject 0.5 mLs (100 mg total) into the muscle every 28 (twenty-eight) days.     Off-Protocol Failed - 12/08/2018  1:04 AM      Failed - Medication not assigned to a protocol, review manually.      Passed - Valid encounter within last 12 months    Recent Outpatient Visits          4 days ago Essential hypertension   [facility], Meda Coffee, MD   1 month ago Hypogonadism in male   [facility], Daisetta, MD   1 month ago Encounter for long-term current use of high risk medication   [facility], Levell July, MD   2 months ago Hypogonadism in male   [facility], Levell July, MD   2 months ago Hypogonadism in male   [facility], Levell July, MD      Future Appointments            In 2 months Myles Lipps, MD [facility] at [place]

## 2018-12-10 NOTE — Telephone Encounter (Signed)
pls see note 

## 2018-12-11 ENCOUNTER — Other Ambulatory Visit: Payer: Self-pay | Admitting: Family Medicine

## 2018-12-11 NOTE — Telephone Encounter (Signed)
Testosterone dose increase - noted on labs - pt needs labs with follow-up office visit 2 weeks after 2nd or 3rd dose before further refills (so 6 or 10 weeks after next injection.)

## 2018-12-19 ENCOUNTER — Other Ambulatory Visit: Payer: Self-pay | Admitting: Family Medicine

## 2018-12-31 ENCOUNTER — Telehealth: Payer: Self-pay | Admitting: Family Medicine

## 2018-12-31 NOTE — Telephone Encounter (Signed)
mychart message sent to pt about their appointment with Dr Shaw °

## 2019-01-02 ENCOUNTER — Ambulatory Visit (INDEPENDENT_AMBULATORY_CARE_PROVIDER_SITE_OTHER): Payer: Medicare Other | Admitting: Family Medicine

## 2019-01-02 DIAGNOSIS — E291 Testicular hypofunction: Secondary | ICD-10-CM

## 2019-01-02 NOTE — Patient Instructions (Signed)
Pt received injection in the lower right glut, tolerated well.

## 2019-01-03 ENCOUNTER — Encounter: Payer: Self-pay | Admitting: Family Medicine

## 2019-01-06 ENCOUNTER — Encounter: Payer: Self-pay | Admitting: Cardiology

## 2019-01-06 DIAGNOSIS — R001 Bradycardia, unspecified: Secondary | ICD-10-CM

## 2019-01-06 HISTORY — DX: Bradycardia, unspecified: R00.1

## 2019-01-06 NOTE — Progress Notes (Signed)
Cardiology Office Note    Date:  01/07/2019   ID:  Mandrill, Gotz 1950/12/03, MRN [id number]  PCP:  Sherren Mocha, MD  Cardiologist:  Armanda Magic, MD   Chief Complaint  Patient presents with  . New Patient (Initial Visit)    Bradycardia    History of Present Illness:  Troy Obrien is a 68 y.o. male who is being seen today for the evaluation of bradycardia at the request of Leretha Pol, Irma M, MD.  This is a 68 year old male with a history of hypertension, hyperlipidemia and arthritis.  He was seen by his PCP on 12/06/2018 for routine office visit.  He was actually doing well without any complaints.  He denied any lightheadedness dizziness or fatigue.  He had apparently been without his blood pressure medicines for several weeks and was just restarted.  Medication list at that time included amlodipine-olmesartan 5-20 mg 1/2 tablet daily, Cosopt eyedrops and Alphagan eyedrops and sildenafil.  That office visit his heart rate was noted to be 45 bpm.  Blood pressure was stable but mildly elevated at 153/75 mmHg.  He was asymptomatic with his bradycardia but it was felt that he should be referred to cardiology.  EKG on that office visit showed marked sinus bradycardia at 42 bpm with nonspecific ST abnormality  He also tells me that he has had occasional chest pain but has not had any since last May when he quit working.  He says the pain will be sharp and midsternal and would last a few minutes at a time.  He denied any associated nausea, vomiting, diaphoresis.  There was no radiation of the discomfort.  He denies any SOB, DOE, PND, orthopnea, LE edema, dizziness, palpitations or syncope. He is compliant with his meds and is tolerating meds with no SE. He has a diagnosis of sleep apnea on his medical record apparently had a sleep study at some point but I cannot find the results of it.  Was back in 2013.  He does not smoke and not know his family history because he is adopted.  Past Medical  History:  Diagnosis Date  . Arthritis   . Bradycardia 01/06/2019  . Glaucoma   . Gout   . Headache(784.0)   . Hyperlipidemia   . Hypertension   . Inguinal hernia   . Rickets, fetal   . Vitamin D deficiency     Past Surgical History:  Procedure Laterality Date  . carpel tunnel     bilateral  . CIRCUMCISION  1972  . INGUINAL HERNIA REPAIR  10/11/2012   Procedure: HERNIA REPAIR INGUINAL ADULT;  Surgeon: Robyne Askew, MD;  [facility];  Service: General;  Laterality: Right;  . INSERTION OF MESH  10/11/2012   Procedure: INSERTION OF MESH;  Surgeon: Robyne Askew, MD;  Location: [facility] OR;  Service: General;  Laterality: Right;  . TONSILLECTOMY  1963    Current Medications: Current Meds  Medication Sig  . amLODipine-olmesartan (AZOR) 5-20 MG tablet Take 1 tablet by mouth daily.  Marland Kitchen aspirin 81 MG tablet Take 81 mg by mouth daily.  Marland Kitchen azelastine (ASTELIN) 0.1 % nasal spray Place 1 spray into both nostrils 2 (two) times daily. Use in each nostril as directed  . brimonidine (ALPHAGAN) 0.2 % ophthalmic solution Place 1 drop into both eyes 2 (two) times daily.  . dorzolamide-timolol (COSOPT) 22.3-6.8 MG/ML ophthalmic solution Place 1 drop into both eyes 2 (two) times daily.  Melene Muller ON  01/30/2019] HYDROcodone-acetaminophen (NORCO) 10-325 MG tablet Take 1-2 tablets by mouth every 6 (six) hours as needed for moderate pain.  Marland Kitchen ipratropium (ATROVENT) 0.03 % nasal spray Place 2 sprays into the nose 4 (four) times daily.  Marland Kitchen latanoprost (XALATAN) 0.005 % ophthalmic solution Place 1 drop into both eyes at bedtime.  . pravastatin (PRAVACHOL) 40 MG tablet Take 1 tablet (40 mg total) by mouth daily. TAKE 1 TABLET BY MOUTH EVERY DAY AT BEDTIME  . sildenafil (REVATIO) 20 MG tablet Take 2 tablets (40 mg total) by mouth as needed. 30 minutes prior to activity no more than every 24 hours  . testosterone cypionate (DEPOTESTOSTERONE CYPIONATE) 200 MG/ML injection Inject 1 mL (200 mg total) into the muscle  every 28 (twenty-eight) days. NEED OFFICE VISIT WITH LABS IN 6 WEEKS OR 10 WEEKS DUE TO DOSE CHANGE.  Marland Kitchen Vitamin D, Ergocalciferol, (DRISDOL) 50000 units CAPS capsule Take 1 capsule (50,000 Units total) by mouth every 7 (seven) days.   Current Facility-Administered Medications for the 01/07/19 encounter (Office Visit) with Quintella Reichert, MD  Medication  . testosterone cypionate (DEPOTESTOSTERONE CYPIONATE) injection 100 mg    Allergies:   Laxative [bisacodyl] and Cyclobenzaprine   Social History   Socioeconomic History  . Marital status: Married    Spouse name: Not on file  . Number of children: 0  . Years of education: Not on file  . Highest education level: Not on file  Occupational History  . Not on file  Social Needs  . Financial resource strain: Not on file  . Food insecurity:    Worry: Not on file    Inability: Not on file  . Transportation needs:    Medical: Not on file    Non-medical: Not on file  Tobacco Use  . Smoking status: Never Smoker  . Smokeless tobacco: Never Used  Substance and Sexual Activity  . Alcohol use: Yes    Alcohol/week: 0.0 standard drinks    Comment: rare  . Drug use: No  . Sexual activity: Yes    Birth control/protection: None  Lifestyle  . Physical activity:    Days per week: Not on file    Minutes per session: Not on file  . Stress: Not on file  Relationships  . Social connections:    Talks on phone: Not on file    Gets together: Not on file    Attends religious service: Not on file    Active member of club or organization: Not on file    Attends meetings of clubs or organizations: Not on file    Relationship status: Not on file  Other Topics Concern  . Not on file  Social History Narrative  . Not on file     Family History:  The patient's   family history is not on file.   ROS:   Please see the history of present illness.    ROS All other systems reviewed and are negative.  No flowsheet data found.    PHYSICAL EXAM:     VS:  Ht 5' 7.91" (1.725 m)   Wt 235 lb (106.6 kg)   BMI 35.83 kg/m    GEN: Well nourished, well developed, in no acute distress  HEENT: normal  Neck: no JVD, carotid bruits, or masses Cardiac: RRR; no murmurs, rubs, or gallops,no edema.  Intact distal pulses bilaterally.  Respiratory:  clear to auscultation bilaterally, normal work of breathing GI: soft, nontender, nondistended, + BS MS: no deformity or atrophy  Skin:  warm and dry, no rash Neuro:  Alert and Oriented x 3, Strength and sensation are intact Psych: euthymic mood, full affect  Wt Readings from Last 3 Encounters:  01/07/19 235 lb (106.6 kg)  12/06/18 240 lb (108.9 kg)  09/13/18 243 lb (110.2 kg)      Studies/Labs Reviewed:   EKG:  EKG is not ordered today.  Recent Labs: 10/25/2018: ALT 15; BUN 11; Creatinine, Ser 1.35; Hemoglobin 14.6; Platelets 276; Potassium 4.9; Sodium 140   Lipid Panel    Component Value Date/Time   CHOL 160 09/06/2017 1739   TRIG 86 09/06/2017 1739   HDL 52 09/06/2017 1739   CHOLHDL 3.1 09/06/2017 1739   CHOLHDL 3.7 01/23/2016 0831   VLDL 25 01/23/2016 0831   LDLCALC 91 09/06/2017 1739   LDLDIRECT 114 (H) 09/11/2018 0917    Additional studies/ records that were reviewed today include:  Office visit notes and twelve-lead EKG    ASSESSMENT:    1. Bradycardia   2. Essential hypertension   3. Chest pain, unspecified type      PLAN:  In order of problems listed above:  1.  Bradycardia -his heart rate was 42 bpm on his office visit EKG with his PCP.  He is completely asymptomatic.  The only drug that he is on that could be contributing to his slow heart rates is his timolol eyedrops.  I have recommended that he stop the Cosopt eyedrops which has timolol in it and have recommended that he let his eye doctor know right away so that he can get on another drug that does not have a beta-blocker in it. I will also get a 2D echocardiogram to assess LV function and structural heart  disease.  We will see my PA back in 2 weeks to see how he is doing.  2.  Hypertension -he is well controlled on exam today.  He will continue on amlodipine-olmesartan 5-20 mg daily.  3.  Chest pain - he has had intermittent chest pain which is atypical and that it sharp and only lasts a few minutes at a time.  There were no associated symptoms and no radiation.  He does not smoke but unfortunately he is a adopted and therefore does not know his family history.  Due to the rare chance that his bradycardia could be due to underlying CAD I recommended getting a stress Myoview to rule out ischemia.  This will also let us be able to assess his chronotropic response to exercise off the Cosopt eyedrops.    Medication Adjustments/Labs and Tests Ordered: Current medicines are reviewed at length with the patient today.  Concerns regarding medicines are outlined above.  Medication changes, Labs and Tests ordered today are listed in the Patient Instructions below.  There are no Patient Instructions on file for this visit.   Signed, Armanda Magic, MD  01/07/2019 2:10 PM    [facility] [address] [place], [place], [place]  [address] Phone: [phone]; Fax: [phone]

## 2019-01-07 ENCOUNTER — Encounter: Payer: Self-pay | Admitting: Cardiology

## 2019-01-07 ENCOUNTER — Ambulatory Visit: Payer: Medicare Other | Admitting: Cardiology

## 2019-01-07 VITALS — Ht 67.91 in | Wt 235.0 lb

## 2019-01-07 DIAGNOSIS — I10 Essential (primary) hypertension: Secondary | ICD-10-CM | POA: Diagnosis not present

## 2019-01-07 DIAGNOSIS — R001 Bradycardia, unspecified: Secondary | ICD-10-CM

## 2019-01-07 DIAGNOSIS — R079 Chest pain, unspecified: Secondary | ICD-10-CM | POA: Diagnosis not present

## 2019-01-07 NOTE — Addendum Note (Signed)
Addended by: Dustin Flock on: 01/07/2019 02:34 PM   Modules accepted: Orders

## 2019-01-07 NOTE — Patient Instructions (Signed)
Medication Instructions:  Stop: Cosopt, call your eye doctor about this change  If you need a refill on your cardiac medications before your next appointment, please call your pharmacy.   Lab work: None If you have labs (blood work) drawn today and your tests are completely normal, you will receive your results only by: Marland Kitchen MyChart Message (if you have MyChart) OR . A paper copy in the mail If you have any lab test that is abnormal or we need to change your treatment, we will call you to review the results.  Testing/Procedures: Your physician has requested that you have an echocardiogram. Echocardiography is a painless test that uses sound waves to create images of your heart. It provides your doctor with information about the size and shape of your heart and how well your heart's chambers and valves are working. This procedure takes approximately one hour. There are no restrictions for this procedure.  Your physician has requested that you have en exercise stress myoview. For further information please visit [email or web address] Please follow instruction sheet, as given.  Follow-Up: Your physician recommends that you schedule a follow-up appointment in: 2 weeks with the PA.

## 2019-01-15 ENCOUNTER — Telehealth (HOSPITAL_COMMUNITY): Payer: Self-pay | Admitting: *Deleted

## 2019-01-15 NOTE — Telephone Encounter (Signed)
Patient given detailed instructions per Myocardial Perfusion Study Information Sheet for the test on 01/18/19 at 1015. Patient notified to arrive 15 minutes early and that it is imperative to arrive on time for appointment to keep from having the test rescheduled.  If you need to cancel or reschedule your appointment, please call the office within 24 hours of your appointment. . Patient verbalized understanding.Lynette Noah, Rise Paganini letter sent.

## 2019-01-18 ENCOUNTER — Other Ambulatory Visit (HOSPITAL_COMMUNITY): Payer: Medicare Other

## 2019-01-18 ENCOUNTER — Ambulatory Visit (HOSPITAL_COMMUNITY): Payer: Medicare Other

## 2019-01-25 ENCOUNTER — Encounter: Payer: Self-pay | Admitting: Cardiology

## 2019-01-28 ENCOUNTER — Ambulatory Visit: Payer: Medicare Other | Admitting: Family Medicine

## 2019-01-30 ENCOUNTER — Telehealth: Payer: Self-pay

## 2019-01-30 NOTE — Telephone Encounter (Signed)
I spoke to the patient and his wife about his upcoming appt with Robbie Lis on 4/8 @ 2:30 pm.  They informed me that they saw Dr Mayford Knife on 3/9 and then the patient was supposed to have a stress test and Echo on 3/20, but they were cancelled.    Apparently they were told that they would f/u after the tests, which would have been the case.  They would prefer, since he is feeling fine, that we cancel this 4/8 appt until after his tests.

## 2019-01-31 ENCOUNTER — Telehealth: Payer: Self-pay

## 2019-01-31 NOTE — Telephone Encounter (Signed)
I spoke to the patient and shared Brittainy's recommendations.  He verbalized understanding.

## 2019-02-06 ENCOUNTER — Ambulatory Visit: Payer: Medicare Other | Admitting: Cardiology

## 2019-02-14 ENCOUNTER — Encounter: Payer: Self-pay | Admitting: Family Medicine

## 2019-02-15 NOTE — Telephone Encounter (Signed)
Pt called to make Dr. Leretha Pol aware that he sent her a [facility] message.

## 2019-02-18 NOTE — Telephone Encounter (Signed)
I referred this patient to pain management in February. Can you please look into this. Thank you

## 2019-02-22 NOTE — Telephone Encounter (Signed)
Pt states he needs to speak to Dr. Adela Glimpse assitant regarding my chart message. Pt states she can call at any time. Phone # [phone] Please advise.

## 2019-02-25 ENCOUNTER — Telehealth: Payer: Self-pay

## 2019-02-25 NOTE — Telephone Encounter (Signed)
Contacted pt, per his request.  Pt states he has made appt for 04/28 and will discuss with MD.

## 2019-02-25 NOTE — Telephone Encounter (Signed)
Copied from CRM [id number]. Topic: General - Inquiry >> Feb 25, 2019 12:25 PM Lynne Logan D wrote: Reason for CRM: Pt would like for Chanda to give him a call regarding MyChart message. Please advise. [phone]

## 2019-02-26 ENCOUNTER — Telehealth (INDEPENDENT_AMBULATORY_CARE_PROVIDER_SITE_OTHER): Payer: Medicare Other | Admitting: Family Medicine

## 2019-02-26 ENCOUNTER — Other Ambulatory Visit: Payer: Self-pay

## 2019-02-26 DIAGNOSIS — I10 Essential (primary) hypertension: Secondary | ICD-10-CM | POA: Diagnosis not present

## 2019-02-26 DIAGNOSIS — E559 Vitamin D deficiency, unspecified: Secondary | ICD-10-CM

## 2019-02-26 MED ORDER — AMLODIPINE-OLMESARTAN 5-20 MG PO TABS: 1.0000 | ORAL_TABLET | Freq: Every day | ORAL | 0 refills | Status: DC

## 2019-02-26 MED ORDER — VITAMIN D (ERGOCALCIFEROL) 1.25 MG (50000 UNIT) PO CAPS: 50000.0000 [IU] | ORAL_CAPSULE | ORAL | 0 refills | Status: DC

## 2019-02-26 NOTE — Progress Notes (Signed)
Spoke with pt today and he informed me that he needs a refill on his Hydrocodone and Vit D tablets. Pt states he is about out of medication at this time. He does have an appointment to with Dr. Leretha Pol for May 8 but need a refill of medication until appointment.

## 2019-02-26 NOTE — Progress Notes (Signed)
Telemedicine Encounter- SOAP NOTE Established Patient  I discussed the limitations, risks, security and privacy concerns of performing an evaluation and management service by telephone and the availability of in person appointments. I also discussed with the patient that there may be a patient responsible charge related to this service. The patient expressed understanding and agreed to proceed.  This telephone encounter was conducted with the patient and their wife-verbal consent via audio telecommunications: yes Patient was instructed to have this encounter in a suitably private space; and to only have persons present to whom they give permission to participate. In addition, patient identity was confirmed by use of name plus two identifiers (DOB and address).  I spent a total of talking with the patient  And their wife cc Spoke with pt today and he informed me that he needs a refill on his Hydrocodone and Vit D tablets. Pt states he is about out of medication at this time. He does have an appointment to with Dr. Leretha Pol for May 8 but need a refill of medication until appointment.   Subjective   Troy Obrien is a 68 y.o. male established patient. Telephone visit today for refill on medication  HPI Pt request refill on Vit D-previous level checked in 11/19-reviewed levels with pt-26.8 Pt taking 50,000 once a week  Pt taking amlodipine/olmesartan daily for HTN-wife states NO MISSED DOSES- bp last taken at home 137/80-request refill labwork ordered for renal function recheck has not been completed-f/u visit 5/8  Pt seen by pain management-injections into the back in 2/20-after referral for pain management evaluation-pt states pain management "only does shots and not medication" He and wife were told to call primary care for medication for pain management. Pt takes hydrocodone/acetaminophen  Patient Active Problem List   Diagnosis Date Noted  . Bradycardia 01/06/2019  .  Encounter for long-term current use of high risk medication 09/11/2018  . Myalgia 09/11/2018  . Hyperesthesia 09/11/2018  . Nasal turbinate hypertrophy 09/05/2017  . Chronic left-sided low back pain without sciatica 09/05/2017  . Combined forms of age-related cataract, bilateral 07/27/2017  . Chronic pain syndrome 04/19/2017  . Opioid type dependence, continuous (HCC) 04/19/2017  . Hyperlipidemia 05/02/2016  . Hypersomnia with sleep apnea 02/17/2016  . Snoring 02/17/2016  . Nocturia more than twice per night 02/17/2016  . Nasal obstruction 02/17/2016  . Sleep related headaches 02/17/2016  . Morbid obesity due to excess calories (HCC) 02/17/2016  . Vitamin D deficiency 02/04/2015  . Other chronic sinusitis 02/04/2015  . Perennial allergic rhinitis 02/04/2015  . Primary open-angle glaucoma 05/29/2014  . BPH associated with nocturia 05/10/2014  . Polypharmacy 04/13/2014  . Raynaud phenomenon 04/13/2014  . Erectile dysfunction 04/13/2014  . Type 2 diabetes mellitus (HCC) 04/13/2014  . Chronic right-sided thoracic back pain 03/26/2014  . Right groin pain 02/20/2013  . Arthritis of hand 07/08/2012  . Fatigue 07/08/2012  . Sleep apnea 07/08/2012  . Hernia 07/08/2012  . Inguinal hernia, right 03/30/2012  . Right upper quadrant pain 03/30/2012  . Back pain 03/30/2012  . Hypertension 02/02/2012  . Glaucoma 02/02/2012    Past Medical History:  Diagnosis Date  . Arthritis   . Bradycardia 01/06/2019  . Glaucoma   . Gout   . Headache(784.0)   . Hyperlipidemia   . Hypertension   . Inguinal hernia   . Rickets, fetal   . Vitamin D deficiency     Current Outpatient Medications  Medication Sig Dispense Refill  . amLODipine-olmesartan (AZOR) 5-20  MG tablet Take 1 tablet by mouth daily. 90 tablet 0  . aspirin 81 MG tablet Take 81 mg by mouth daily.    Marland Kitchen azelastine (ASTELIN) 0.1 % nasal spray Place 1 spray into both nostrils 2 (two) times daily. Use in each nostril as directed 30 mL 12   . brimonidine (ALPHAGAN) 0.2 % ophthalmic solution Place 1 drop into both eyes 2 (two) times daily.  11  . dorzolamide (TRUSOPT) 2 % ophthalmic solution Place 1 drop into both eyes 3 times daily.    . dorzolamide-timolol (COSOPT) 22.3-6.8 MG/ML ophthalmic solution Place 1 drop into both eyes 2 (two) times daily.  7  . HYDROcodone-acetaminophen (NORCO) 10-325 MG tablet Take 1-2 tablets by mouth every 6 (six) hours as needed for moderate pain. 210 tablet 0  . ipratropium (ATROVENT) 0.03 % nasal spray Place 2 sprays into the nose 4 (four) times daily. 30 mL 1  . latanoprost (XALATAN) 0.005 % ophthalmic solution Place 1 drop into both eyes at bedtime.  11  . pravastatin (PRAVACHOL) 40 MG tablet Take 1 tablet (40 mg total) by mouth daily. TAKE 1 TABLET BY MOUTH EVERY DAY AT BEDTIME 90 tablet 1  . sildenafil (REVATIO) 20 MG tablet Take 2 tablets (40 mg total) by mouth as needed. 30 minutes prior to activity no more than every 24 hours 30 tablet 3  . testosterone cypionate (DEPOTESTOSTERONE CYPIONATE) 200 MG/ML injection Inject 1 mL (200 mg total) into the muscle every 28 (twenty-eight) days. NEED OFFICE VISIT WITH LABS IN 6 WEEKS OR 10 WEEKS DUE TO DOSE CHANGE. 1 mL 2  . Vitamin D, Ergocalciferol, (DRISDOL) 1.25 MG (50000 UT) CAPS capsule Take 1 capsule (50,000 Units total) by mouth every 7 (seven) days. 24 capsule 0   Current Facility-Administered Medications  Medication Dose Route Frequency Provider Last Rate Last Dose  . testosterone cypionate (DEPOTESTOSTERONE CYPIONATE) injection 100 mg  100 mg Intramuscular Q28 days Sherren Mocha, MD   100 mg at 01/02/19 1623    Allergies  Allergen Reactions  . Laxative [Bisacodyl] Hives  . Cyclobenzaprine Other (See Comments)    Hand twitching/spasms    Social History   Socioeconomic History  . Marital status: Married    Spouse name: Not on file  . Number of children: 0  . Years of education: Not on file  . Highest education level: Not on file   Occupational History  . Not on file  Social Needs  . Financial resource strain: Not on file  . Food insecurity:    Worry: Not on file    Inability: Not on file  . Transportation needs:    Medical: Not on file    Non-medical: Not on file  Tobacco Use  . Smoking status: Never Smoker  . Smokeless tobacco: Never Used  Substance and Sexual Activity  . Alcohol use: Yes    Alcohol/week: 0.0 standard drinks    Comment: rare  . Drug use: No  . Sexual activity: Yes    Birth control/protection: None  Lifestyle  . Physical activity:    Days per week: Not on file    Minutes per session: Not on file  . Stress: Not on file  Relationships  . Social connections:    Talks on phone: Not on file    Gets together: Not on file    Attends religious service: Not on file    Active member of club or organization: Not on file    Attends meetings of clubs or  organizations: Not on file    Relationship status: Not on file  . Intimate partner violence:    Fear of current or ex partner: Not on file    Emotionally abused: Not on file    Physically abused: Not on file    Forced sexual activity: Not on file  Other Topics Concern  . Not on file  Social History Narrative  . Not on file    ROS CONSTITUTIONAL: no  fever MS: chronic back pain-vicodin used Cardio: no CP, no LE edema Resp: no SOB   Objective   Vitals as reported by the patient:  bp 138/80 Essential hypertension Refill given for amlodipine/olmesartan-reinforced appt 5/8 as well as blood work recommended to recheck bp Vitamin D deficiency  -refill on Vit D 50,000units Needs recheck of levels with other blood work  D/w pt and wife could not refill pain medication-Norco-pt seen by pain management who will be caring for pts pain concerns-pt with chronic pain concerns and received injections from pain mangement provider. Pt and wife concerned about unclear communication with primary care and pain management. Have requested pain  management notes. Discussed that only one provider must manage pain medication and referral to a specialist means pain management is the provider caring for the patients pain symptoms.     I discussed the assessment and treatment plan with the patient. The patient was provided an opportunity to ask questions and all were answered. The patient agreed with the plan and demonstrated an understanding of the instructions.   The patient was advised to call back or seek an in-person evaluation if the symptoms worsen or if the condition fails to improve as anticipated.  I provided 15 minutes of non-face-to-face time during this encounter.  Jonetta Dagley Mat CarneLEIGH [name] Genova, MD  [facility] 02-26-19

## 2019-02-27 ENCOUNTER — Telehealth (INDEPENDENT_AMBULATORY_CARE_PROVIDER_SITE_OTHER): Payer: Medicare Other | Admitting: Family Medicine

## 2019-02-27 ENCOUNTER — Other Ambulatory Visit: Payer: Self-pay

## 2019-02-27 ENCOUNTER — Encounter: Payer: Self-pay | Admitting: Family Medicine

## 2019-02-27 DIAGNOSIS — B370 Candidal stomatitis: Secondary | ICD-10-CM | POA: Diagnosis not present

## 2019-02-27 MED ORDER — NYSTATIN 100000 UNIT/ML MT SUSP: 5.0000 mL | Freq: Four times a day (QID) | OROMUCOSAL | 0 refills | Status: DC

## 2019-02-27 NOTE — Progress Notes (Signed)
Virtual Visit Note  I connected with patient on 02/27/19 at 939am by phone and verified that I am speaking with the correct person using two identifiers. Troy Obrien is currently located at home and patient is currently with them during visit. The provider, Myles LippsIrma M Santiago, MD is located in their office at time of visit.  I discussed the limitations, risks, security and privacy concerns of performing an evaluation and management service by telephone and the availability of in person appointments. I also discussed with the patient that there may be a patient responsible charge related to this service. The patient expressed understanding and agreed to proceed.   CC: thrush  HPI ? Patient reports white painful tongue 2-3 weeks Painful to eat, drink Tried warm, salt gargles Reports thrush several years Does not use inhalers Denies GERD Does not smoke Denies any partials, dentures, etc Neg HIV 2015 - denies any risk factors  Allergies  Allergen Reactions  . Laxative [Bisacodyl] Hives  . Cyclobenzaprine Other (See Comments)    Hand twitching/spasms    Prior to Admission medications   Medication Sig Start Date End Date Taking? Authorizing Provider  amLODipine-olmesartan (AZOR) 5-20 MG tablet Take 1 tablet by mouth daily. 02/26/19  Yes Corum, Minerva FesterLisa L, MD  aspirin 81 MG tablet Take 81 mg by mouth daily.   Yes [provider]  azelastine (ASTELIN) 0.1 % nasal spray Place 1 spray into both nostrils 2 (two) times daily. Use in each nostril as directed 02/12/18  Yes Sherren MochaShaw, Eva N, MD  brimonidine [facility](ALPHAGAN) 0.2 % ophthalmic solution Place 1 drop into both eyes 2 (two) times daily. 11/18/17  Yes Bond, Doran StablerJeffrey Brent, MD  dorzolamide (TRUSOPT) 2 % ophthalmic solution Place 1 drop into both eyes 3 times daily. 01/23/19 01/23/20 Yes [provider]  dorzolamide-timolol (COSOPT) 22.3-6.8 MG/ML ophthalmic solution Place 1 drop into both eyes 2 (two) times daily. 01/24/18  Yes Bond,  Doran StablerJeffrey Brent, MD  HYDROcodone-acetaminophen [facility](NORCO) 10-325 MG tablet Take 1-2 tablets by mouth every 6 (six) hours as needed for moderate pain. 01/30/19  Yes Myles LippsSantiago, Teneil Shiller M, MD  ipratropium (ATROVENT) 0.03 % nasal spray Place 2 sprays into the nose 4 (four) times daily. 02/12/18  Yes Sherren MochaShaw, Eva N, MD  latanoprost (XALATAN) 0.005 % ophthalmic solution Place 1 drop into both eyes at bedtime. 01/16/18  Yes Bond, Doran StablerJeffrey Brent, MD  pravastatin (PRAVACHOL) 40 MG tablet Take 1 tablet (40 mg total) by mouth daily. TAKE 1 TABLET BY MOUTH EVERY DAY AT BEDTIME 09/13/18  Yes Sherren MochaShaw, Eva N, MD  sildenafil (REVATIO) 20 MG tablet Take 2 tablets (40 mg total) by mouth as needed. 30 minutes prior to activity no more than every 24 hours 05/14/18  Yes Sherren MochaShaw, Eva N, MD  testosterone cypionate (DEPOTESTOSTERONE CYPIONATE) 200 MG/ML injection Inject 1 mL (200 mg total) into the muscle every 28 (twenty-eight) days. NEED OFFICE VISIT WITH LABS IN 6 WEEKS OR 10 WEEKS DUE TO DOSE CHANGE. 12/11/18  Yes Sherren MochaShaw, Eva N, MD  Vitamin D, Ergocalciferol, (DRISDOL) 1.25 MG (50000 UT) CAPS capsule Take 1 capsule (50,000 Units total) by mouth every 7 (seven) days. 02/26/19  Yes Corum, Minerva FesterLisa L, MD    Past Medical History:  Diagnosis Date  . Arthritis   . Bradycardia 01/06/2019  . Glaucoma   . Gout   . Headache(784.0)   . Hyperlipidemia   . Hypertension   . Inguinal hernia   . Rickets, fetal   . Vitamin D deficiency  Past Surgical History:  Procedure Laterality Date  . carpel tunnel     bilateral  . CIRCUMCISION  1972  . INGUINAL HERNIA REPAIR  10/11/2012   Procedure: HERNIA REPAIR INGUINAL ADULT;  Surgeon: Robyne Askew, MD;  Location: [facility] OR;  Service: General;  Laterality: Right;  . INSERTION OF MESH  10/11/2012   Procedure: INSERTION OF MESH;  Surgeon: Robyne Askew, MD;  Location: [facility] OR;  Service: General;  Laterality: Right;  . TONSILLECTOMY  1963    Social History   Tobacco Use  . Smoking status: Never Smoker  .  Smokeless tobacco: Never Used  Substance Use Topics  . Alcohol use: Yes    Alcohol/week: 0.0 standard drinks    Comment: rare    No family history on file.  ROS Per hpi  Objective  Vitals as reported by the patient: none      ASSESSMENT and PLAN  1. Oral thrush Discussed supportive measures, new meds r/se/b and RTC precautions. Patient educational handout given.  Other orders - nystatin (MYCOSTATIN) 100000 UNIT/ML suspension; Take 5 mLs (500,000 Units total) by mouth 4 (four) times daily.  FOLLOW-UP: prn   The above assessment and management plan was discussed with the patient. The patient verbalized understanding of and has agreed to the management plan. Patient is aware to call the clinic if symptoms persist or worsen. Patient is aware when to return to the clinic for a follow-up visit. Patient educated on when it is appropriate to go to the emergency department.    I provided 10 minutes of non-face-to-face time during this encounter.  Myles Lipps, MD Primary Care at [facility] [address] [place], [place] [address] Ph.  [phone] Fax [phone]

## 2019-02-27 NOTE — Progress Notes (Signed)
Pt c/o thrush and has had it for 2-3 weeks. Sent pictures via mychart.

## 2019-02-27 NOTE — Patient Instructions (Signed)
Oral Thrush, Adult    Oral thrush, also called oral candidiasis, is a fungal infection that develops in the mouth and throat and on the tongue. It causes white patches to form on the mouth and tongue. Thrush is most common in older adults, but it can occur at any age.  Many cases of thrush are mild, but this infection can also be serious. Thrush can be a repeated (recurrent) problem for certain people who have a weak body defense system (immune system). The weakness can be caused by chronic illnesses, or by taking medicines that limit the body's ability to fight infection. If a person has difficulty fighting infection, the fungus that causes thrush can spread through the body. This can cause life-threatening blood or organ infections.  What are the causes?  This condition is caused by a fungus (yeast) called Candida albicans.   This fungus is normally present in small amounts in the mouth and on other mucous membranes. It usually causes no harm.   If conditions are present that allow the fungus to grow without control, it invades surrounding tissues and becomes an infection.   Other Candida species can also lead to thrush (rare).  What increases the risk?  This condition is more likely to develop in:   People with a weakened immune system.   Older adults.   People with HIV (human immunodeficiency virus).   People with diabetes.   People with dry mouth (xerostomia).   Pregnant women.   People with poor dental care, especially people who have false teeth.   People who use antibiotic medicines.  What are the signs or symptoms?  Symptoms of this condition can vary from mild and moderate to severe and persistent. Symptoms may include:   A burning feeling in the mouth and throat. This can occur at the start of a thrush infection.   White patches that stick to the mouth and tongue. The tissue around the patches may be red, raw, and painful. If rubbed (during tooth brushing, for example), the patches and the  tissue of the mouth may bleed easily.   A bad taste in the mouth or difficulty tasting foods.   A cottony feeling in the mouth.   Pain during eating and swallowing.   Poor appetite.   Cracking at the corners of the mouth.  How is this diagnosed?  This condition is diagnosed based on:   Physical exam. Your health care provider will look in your mouth.   Health history. Your health care provider will ask you questions about your health.  How is this treated?  This condition is treated with medicines called antifungals, which prevent the growth of fungi. These medicines are either applied directly to the affected area (topical) or swallowed (oral). The treatment will depend on the severity of the condition.  Mild thrush  Mild cases of thrush may clear up with the use of an antifungal mouth rinse or lozenges. Treatment usually lasts about 14 days.  Moderate to severe thrush   More severe thrush infections that have spread to the esophagus are treated with an oral antifungal medicine. A topical antifungal medicine may also be used.   For some severe infections, treatment may need to continue for more than 14 days.   Oral antifungal medicines are rarely used during pregnancy because they may be harmful to the unborn child. If you are pregnant, talk with your health care provider about options for treatment.  Persistent or recurrent thrush  For cases of   thrush that do not go away or keep coming back:   Treatment may be needed twice as long as the symptoms last.   Treatment will include both oral and topical antifungal medicines.   People with a weakened immune system can take an antifungal medicine on a continuous basis to prevent thrush infections.  It is important to treat conditions that make a person more likely to get thrush, such as diabetes or HIV.  Follow these instructions at home:  Medicines   Take over-the-counter and prescription medicines only as told by your health care provider.   Talk with  your health care provider about an over-the-counter medicine called gentian violet, which kills bacteria and fungi.  Relieving soreness and discomfort  To help reduce the discomfort of thrush:   Drink cold liquids such as water or iced tea.   Try flavored ice treats or frozen juices.   Eat foods that are easy to swallow, such as gelatin, ice cream, or custard.   Try drinking from a straw if the patches in your mouth are painful.    General instructions   Eat plain, unflavored yogurt as directed by your health care provider. Check the label to make sure the yogurt contains live cultures. This yogurt can help healthy bacteria to grow in the mouth and can stop the growth of the fungus that causes thrush.   If you wear dentures, remove the dentures before going to bed, brush them vigorously, and soak them in a cleaning solution as directed by your health care provider.   Rinse your mouth with a warm salt-water mixture several times a day. To make a salt-water mixture, completely dissolve 1/2-1 tsp of salt in 1 cup of warm water.  Contact a health care provider if:   Your symptoms are getting worse or are not improving within 7 days of starting treatment.   You have symptoms of a spreading infection, such as white patches on the skin outside of the mouth.  This information is not intended to replace advice given to you by your health care provider. Make sure you discuss any questions you have with your health care provider.  Document Released: 07/12/2004 Document Revised: 07/11/2016 Document Reviewed: 07/11/2016  Elsevier Interactive Patient Education  2019 [employer]

## 2019-02-28 ENCOUNTER — Other Ambulatory Visit: Payer: Self-pay | Admitting: Pain Medicine

## 2019-02-28 DIAGNOSIS — M545 Low back pain, unspecified: Secondary | ICD-10-CM

## 2019-03-08 ENCOUNTER — Ambulatory Visit: Payer: Medicare Other | Admitting: Family Medicine

## 2019-03-29 ENCOUNTER — Other Ambulatory Visit: Payer: Self-pay

## 2019-03-29 ENCOUNTER — Ambulatory Visit (HOSPITAL_COMMUNITY)
Admission: RE | Admit: 2019-03-29 | Discharge: 2019-03-29 | Disposition: A | Discharge status: Home / Self Care | Payer: Medicare Other | Source: Ambulatory Visit | Attending: Cardiology | Admitting: Cardiology

## 2019-03-29 DIAGNOSIS — R001 Bradycardia, unspecified: Secondary | ICD-10-CM | POA: Diagnosis not present

## 2019-04-10 ENCOUNTER — Other Ambulatory Visit: Payer: Medicare Other

## 2019-04-24 ENCOUNTER — Ambulatory Visit: Payer: Medicare Other | Admitting: Physician Assistant

## 2019-05-23 ENCOUNTER — Other Ambulatory Visit: Payer: Self-pay

## 2019-05-23 MED ORDER — AMLODIPINE-OLMESARTAN 5-20 MG PO TABS: 1.0000 | ORAL_TABLET | Freq: Every day | ORAL | 0 refills | Status: DC

## 2019-06-04 ENCOUNTER — Other Ambulatory Visit: Payer: Self-pay | Admitting: Family Medicine

## 2019-07-02 DIAGNOSIS — M47816 Spondylosis without myelopathy or radiculopathy, lumbar region: Secondary | ICD-10-CM

## 2019-07-02 HISTORY — DX: Spondylosis without myelopathy or radiculopathy, lumbar region: M47.816

## 2019-08-16 DIAGNOSIS — M47816 Spondylosis without myelopathy or radiculopathy, lumbar region: Secondary | ICD-10-CM

## 2019-08-16 HISTORY — DX: Spondylosis without myelopathy or radiculopathy, lumbar region: M47.816

## 2019-09-12 ENCOUNTER — Telehealth: Payer: Self-pay | Admitting: *Deleted

## 2019-09-12 NOTE — Telephone Encounter (Signed)
Patient declined  

## 2019-11-19 HISTORY — PX: TRABECULECTOMY: SHX107

## 2019-12-17 ENCOUNTER — Telehealth: Payer: Self-pay | Admitting: Family Medicine

## 2019-12-17 NOTE — Telephone Encounter (Signed)
[facility] called and is requesting to know if pt should be off baby aspirin for more than one day in order to have the pts tooth extracted tomorrow. Please advise.   Callback # [phone]

## 2019-12-18 NOTE — Telephone Encounter (Incomplete)
Give verbal order to proceed with extraction. Dentist will send over form for provider to sign. He may resume asa 24 hours post op

## 2019-12-25 NOTE — Telephone Encounter (Signed)
A fax cover letter has been sent to the [facility] stating that the pt is able to stop aspirin for procedure and able to restart 24 hrs post op.   The extraction was done as scheduled last week.

## 2019-12-26 ENCOUNTER — Telehealth: Payer: Self-pay

## 2019-12-26 NOTE — Telephone Encounter (Signed)
PATIENT IS WITH ANOTHER PRIMARY CARE

## 2020-05-17 ENCOUNTER — Emergency Department (HOSPITAL_COMMUNITY)
Admission: EM | Admit: 2020-05-17 | Discharge: 2020-05-17 | Disposition: A | Discharge status: Home / Self Care | Payer: Medicare Other | Attending: Emergency Medicine | Admitting: Emergency Medicine

## 2020-05-17 ENCOUNTER — Encounter (HOSPITAL_COMMUNITY): Payer: Self-pay | Admitting: Emergency Medicine

## 2020-05-17 ENCOUNTER — Emergency Department (HOSPITAL_COMMUNITY): Payer: Medicare Other

## 2020-05-17 ENCOUNTER — Other Ambulatory Visit: Payer: Self-pay

## 2020-05-17 DIAGNOSIS — Z7982 Long term (current) use of aspirin: Secondary | ICD-10-CM | POA: Insufficient documentation

## 2020-05-17 DIAGNOSIS — R079 Chest pain, unspecified: Secondary | ICD-10-CM | POA: Insufficient documentation

## 2020-05-17 DIAGNOSIS — E119 Type 2 diabetes mellitus without complications: Secondary | ICD-10-CM | POA: Insufficient documentation

## 2020-05-17 DIAGNOSIS — Z79899 Other long term (current) drug therapy: Secondary | ICD-10-CM | POA: Diagnosis not present

## 2020-05-17 DIAGNOSIS — I10 Essential (primary) hypertension: Secondary | ICD-10-CM | POA: Insufficient documentation

## 2020-05-17 LAB — BASIC METABOLIC PANEL
Anion gap: 8 (ref 5–15)
BUN: 22 mg/dL (ref 8–23)
CO2: 24 mmol/L (ref 22–32)
Calcium: 9.3 mg/dL (ref 8.9–10.3)
Chloride: 105 mmol/L (ref 98–111)
Creatinine, Ser: 1.57 mg/dL — ABNORMAL HIGH (ref 0.61–1.24)
GFR calc Af Amer: 52 mL/min — ABNORMAL LOW (ref >60)
GFR calc non Af Amer: 45 mL/min — ABNORMAL LOW (ref >60)
Glucose, Bld: 120 mg/dL — ABNORMAL HIGH (ref 70–99)
Potassium: 4.3 mmol/L (ref 3.5–5.1)
Sodium: 137 mmol/L (ref 135–145)

## 2020-05-17 LAB — CBC
HCT: 52.5 % — ABNORMAL HIGH (ref 39.0–52.0)
Hemoglobin: 16.5 g/dL (ref 13.0–17.0)
MCH: 25.8 pg — ABNORMAL LOW (ref 26.0–34.0)
MCHC: 31.4 g/dL (ref 30.0–36.0)
MCV: 82.2 fL (ref 80.0–100.0)
Platelets: 246 10*3/uL (ref 150–400)
RBC: 6.39 MIL/uL — ABNORMAL HIGH (ref 4.22–5.81)
RDW: 17.2 % — ABNORMAL HIGH (ref 11.5–15.5)
WBC: 5.8 10*3/uL (ref 4.0–10.5)
nRBC: 0 % (ref 0.0–0.2)

## 2020-05-17 LAB — TROPONIN I (HIGH SENSITIVITY)
Troponin I (High Sensitivity): 13 ng/L (ref <18)
Troponin I (High Sensitivity): 12 ng/L (ref <18)

## 2020-05-17 NOTE — ED Notes (Signed)
Patient verbalizes understanding of discharge instructions. Opportunity for questioning and answers were provided. Armband removed by staff, pt discharged from ED ambulatory.   

## 2020-05-17 NOTE — Discharge Instructions (Addendum)
Please call tomorrow to make appointment with cardiology office. I have provided your information in the form of an ambulatory referral.  Please take all your medications as prescribed.  Please return immediately or concerning symptoms including chest pain shortness of breath, nausea, vomiting, lightheadedness, dizziness, sweating.

## 2020-05-17 NOTE — ED Triage Notes (Signed)
Pt BIB [facility] from home. Pt complaint of chest pain that lasted approx. 10-15 minutes. Substernal in nature. Pain resolved by time pt got in back of ambulance. Hx of HTN. VSS. NAD.

## 2020-05-17 NOTE — ED Provider Notes (Signed)
[facility] EMERGENCY DEPARTMENT Provider Note   CSN: [id number] Arrival date & time: 05/17/20  1420     History Chief Complaint  Patient presents with  . Chest Pain    YONAH TANGEMAN is a 69 y.o. male with PMH significant for HTN, HLD, and recently diagnosed type II DM on Metformin who presents the ED via EMS after having a 15-minute episode of left-sided "pressure-like" 7 out of 10 chest pain.  Patient was adopted and does not know if he has any family history of cardiac disease.  He does not smoke tobacco.  He states that he has had similar episodes of pain in the past, most recently 1 year ago.  He has never seen a cardiologist, but his primary care provider had advised that he have a cardiac stress test performed.  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  He did pick up a couple of boxes yesterday, unsure as to whether or not that was a contributing factor for today's chest pain.  He states that it occurred while [place] and continued to persist for 10 minutes after parking his vehicle before a bystander called 911.  They stated that he was diaphoretic, but he states that it was because it is warm outside.  He denied any associated nausea or shortness of breath symptoms.  He did not take anything for his symptoms of pain.  He denies any recent illness or infection, fevers or chills, cough, pleuritic symptoms, history of clots, unilateral extremity edema, palpitations, or other symptoms.    Past Medical History:  Diagnosis Date  . Arthritis   . Bradycardia 01/06/2019  . Glaucoma   . Gout   . Headache(784.0)   . Hyperlipidemia   . Hypertension   . Inguinal hernia   . Rickets, fetal   . Vitamin D deficiency     Patient Active Problem List   Diagnosis Date Noted  . Bradycardia 01/06/2019  . Encounter for long-term current use of high risk medication 09/11/2018  . Myalgia  09/11/2018  . Hyperesthesia 09/11/2018  . Nasal turbinate hypertrophy 09/05/2017  . Chronic left-sided low back pain without sciatica 09/05/2017  . Combined forms of age-related cataract, bilateral 07/27/2017  . Chronic pain syndrome 04/19/2017  . Opioid type dependence, continuous (HCC) 04/19/2017  . Hyperlipidemia 05/02/2016  . Hypersomnia with sleep apnea 02/17/2016  . Snoring 02/17/2016  . Nocturia more than twice per night 02/17/2016  . Nasal obstruction 02/17/2016  . Sleep related headaches 02/17/2016  . Morbid obesity due to excess calories (HCC) 02/17/2016  . Vitamin D deficiency 02/04/2015  . Other chronic sinusitis 02/04/2015  . Perennial allergic rhinitis 02/04/2015  . Primary open-angle glaucoma 05/29/2014  . BPH associated with nocturia 05/10/2014  . Polypharmacy 04/13/2014  . Raynaud phenomenon 04/13/2014  . Erectile dysfunction 04/13/2014  . Type 2 diabetes mellitus (HCC) 04/13/2014  . Chronic right-sided thoracic back pain 03/26/2014  . Right groin pain 02/20/2013  . Arthritis of hand 07/08/2012  . Fatigue 07/08/2012  . Sleep apnea 07/08/2012  . Hernia 07/08/2012  . Inguinal hernia, right 03/30/2012  . Right upper quadrant pain 03/30/2012  . Back pain 03/30/2012  . Essential hypertension 02/02/2012  . Glaucoma 02/02/2012    Past Surgical History:  Procedure Laterality Date  . carpel tunnel     bilateral  . CIRCUMCISION  1972  . INGUINAL HERNIA REPAIR  10/11/2012  Procedure: HERNIA REPAIR INGUINAL ADULT;  Surgeon: Robyne Askew, MD;  Location: [facility] OR;  Service: General;  Laterality: Right;  . INSERTION OF MESH  10/11/2012   Procedure: INSERTION OF MESH;  Surgeon: Robyne Askew, MD;  Location: [facility] OR;  Service: General;  Laterality: Right;  . TONSILLECTOMY  1963       History reviewed. No pertinent family history.  Social History   Tobacco Use  . Smoking status: Never Smoker  . Smokeless tobacco: Never Used  Vaping Use  . Vaping Use: Never  used  Substance Use Topics  . Alcohol use: Yes    Alcohol/week: 0.0 standard drinks    Comment: rare  . Drug use: No    Home Medications Prior to Admission medications   Medication Sig Start Date End Date Taking? Authorizing Provider  amLODipine-olmesartan (AZOR) 5-20 MG tablet Take 1 tablet by mouth daily. 05/23/19   Myles Lipps, MD  aspirin 81 MG tablet Take 81 mg by mouth daily.    [provider]  azelastine (ASTELIN) 0.1 % nasal spray Place 1 spray into both nostrils 2 (two) times daily. Use in each nostril as directed 02/12/18   Sherren Mocha, MD  brimonidine [facility]) 0.2 % ophthalmic solution Place 1 drop into both eyes 2 (two) times daily. 11/18/17   Tilden Dome, MD  dorzolamide-timolol (COSOPT) 22.3-6.8 MG/ML ophthalmic solution Place 1 drop into both eyes 2 (two) times daily. 01/24/18   Tilden Dome, MD  HYDROcodone-acetaminophen (NORCO) 10-325 MG tablet Take 1-2 tablets by mouth every 6 (six) hours as needed for moderate pain. 01/30/19   Myles Lipps, MD  ipratropium (ATROVENT) 0.03 % nasal spray Place 2 sprays into the nose 4 (four) times daily. 02/12/18   Sherren Mocha, MD  latanoprost (XALATAN) 0.005 % ophthalmic solution Place 1 drop into both eyes at bedtime. 01/16/18   Bond, Doran Stabler, MD  nystatin (MYCOSTATIN) 100000 UNIT/ML suspension Take 5 mLs (500,000 Units total) by mouth 4 (four) times daily. 02/27/19   Myles Lipps, MD  pravastatin (PRAVACHOL) 40 MG tablet Take 1 tablet (40 mg total) by mouth daily. TAKE 1 TABLET BY MOUTH EVERY DAY AT BEDTIME 09/13/18   Sherren Mocha, MD  sildenafil (REVATIO) 20 MG tablet Take 2 tablets (40 mg total) by mouth as needed. 30 minutes prior to activity no more than every 24 hours 05/14/18   Sherren Mocha, MD  testosterone cypionate (DEPOTESTOSTERONE CYPIONATE) 200 MG/ML injection Inject 1 mL (200 mg total) into the muscle every 28 (twenty-eight) days. NEED OFFICE VISIT WITH LABS IN 6 WEEKS OR 10 WEEKS DUE TO DOSE  CHANGE. 12/11/18   Sherren Mocha, MD  Vitamin D, Ergocalciferol, (DRISDOL) 1.25 MG (50000 UT) CAPS capsule Take 1 capsule (50,000 Units total) by mouth every 7 (seven) days. 02/26/19   Wandra Feinstein, MD    Allergies    Laxative [bisacodyl] and Cyclobenzaprine  Review of Systems   Review of Systems  All other systems reviewed and are negative.   Physical Exam Updated Vital Signs BP 133/83 (BP Location: Right Arm)   Pulse (!) 51   Temp 98.2 F (36.8 C) (Oral)   Resp 15   Ht 5\' 7"  (1.702 m)   Wt 113.4 kg   SpO2 97%   BMI 39.16 kg/m   Physical Exam Vitals and nursing note reviewed. Exam conducted with a chaperone present.  Constitutional:      General: He is not  in acute distress.    Appearance: Normal appearance. He is not ill-appearing.  HENT:     Head: Normocephalic and atraumatic.  Eyes:     General: No scleral icterus.    Conjunctiva/sclera: Conjunctivae normal.  Cardiovascular:     Rate and Rhythm: Normal rate and regular rhythm.     Pulses: Normal pulses.     Heart sounds: Normal heart sounds.  Pulmonary:     Effort: Pulmonary effort is normal. No respiratory distress.     Breath sounds: Normal breath sounds. No wheezing or rales.     Comments: No reproducible anterior chest wall tenderness to palpation. Musculoskeletal:     Comments: I had patient perform left arm chest fly against resistance, did not elicit any pain symptoms.  Skin:    General: Skin is dry.     Capillary Refill: Capillary refill takes less than 2 seconds.  Neurological:     Mental Status: He is alert and oriented to person, place, and time.     GCS: GCS eye subscore is 4. GCS verbal subscore is 5. GCS motor subscore is 6.  Psychiatric:        Mood and Affect: Mood normal.        Behavior: Behavior normal.        Thought Content: Thought content normal.     ED Results / Procedures / Treatments   Labs (all labs ordered are listed, but only abnormal results are displayed) Labs Reviewed    BASIC METABOLIC PANEL - Abnormal; Notable for the following components:      Result Value   Glucose, Bld 120 (*)    Creatinine, Ser 1.57 (*)    GFR calc non Af Amer 45 (*)    GFR calc Af Amer 52 (*)    All other components within normal limits  CBC - Abnormal; Notable for the following components:   RBC 6.39 (*)    HCT 52.5 (*)    MCH 25.8 (*)    RDW 17.2 (*)    All other components within normal limits  TROPONIN I (HIGH SENSITIVITY)  TROPONIN I (HIGH SENSITIVITY)    EKG EKG Interpretation  Date/Time:  Sunday May 17 2020 14:25:15 EDT Ventricular Rate:  53 PR Interval:    QRS Duration: 95 QT Interval:  418 QTC Calculation: 393 R Axis:   -31 Text Interpretation: Sinus rhythm Left axis deviation Posterior infarct, old No significant change since 07/08/2018 Confirmed by Geoffery LyonseLo, Douglas ([id number]) on 05/17/2020 2:40:38 PM   Radiology DG Chest 2 View  Result Date: 05/17/2020 CLINICAL DATA:  Substernal chest pain. EXAM: CHEST - 2 VIEW COMPARISON:  May 21, 2016 FINDINGS: Cardiomediastinal silhouette is normal. Mediastinal contours appear intact. There is no evidence of focal airspace consolidation, pleural effusion or pneumothorax. Osseous structures are without acute abnormality. Soft tissues are grossly normal. IMPRESSION: No active cardiopulmonary disease. Electronically Signed   By: Ted Mcalpineobrinka  Dimitrova M.D.   On: 05/17/2020 15:15    Procedures Procedures (including critical care time)  Medications Ordered in ED Medications - No data to display  ED Course  I have reviewed the triage vital signs and the nursing notes.  Pertinent labs & imaging results that were available during my care of the patient were reviewed by me and considered in my medical decision making (see chart for details).    MDM Rules/Calculators/A&P HEAR Score: 5  Patient comes to the ED via EMS for evaluation of acute onset left-sided pressure-like 7 out of 10 chest pain.  Patient is  69 years old with multiple risk factors for cardiac disease.  He has never been evaluated by cardiology.  No history of echocardiogram or cardiac stress testing, however it has been encouraged by his primary care provider.  While patient suggest that his chest pain could have been musculoskeletal as he has been moving houses this weekend, there was no reproducible anterior chest wall tenderness to palpation or reproducible chest pain with left-sided chest fly against resistance.  Given that his chest pain began while at rest ([place]), there is concern for an unstable angina picture.    Labs Troponin: 13 >> BMP: Mild renal impairment with creatinine elevated 1.57 and GFR reduced to 52. CBC: Unremarkable.  Imaging I personally reviewed plain films obtained of chest which demonstrated no acute cardiopulmonary findings.  EKG is reviewed and there are no new changes when compared to prior tracings.  Patient is HEAR Score 5 and repeat troponin is required before disposition is made.  Patient has stated that he would prefer outpatient management and that his chest discomfort has almost entirely improved.  However, given his risk factors and concern for unstable angina, he would need very close cardiology follow-up.  If his repeat troponin is greater than 18, cardiology to be consulted and admission encouraged.   At shift change care was transferred to [facility], PA-C who will follow pending studies, re-evaluate, and determine disposition.     Final Clinical Impression(s) / ED Diagnoses Final diagnoses:  Chest pain, unspecified type    Rx / DC Orders ED Discharge Orders    None       Lorelee New, PA-C 05/17/20 1614    Geoffery Lyons, MD 05/17/20 Ernestina Columbia

## 2020-05-25 ENCOUNTER — Other Ambulatory Visit: Payer: Self-pay

## 2020-05-25 ENCOUNTER — Encounter: Payer: Self-pay | Admitting: *Deleted

## 2020-05-25 ENCOUNTER — Ambulatory Visit: Payer: Medicare Other | Admitting: Cardiology

## 2020-05-25 ENCOUNTER — Encounter: Payer: Self-pay | Admitting: Cardiology

## 2020-05-25 VITALS — BP 130/70 | HR 60 | Ht 67.0 in | Wt 258.0 lb

## 2020-05-25 DIAGNOSIS — R079 Chest pain, unspecified: Secondary | ICD-10-CM | POA: Diagnosis not present

## 2020-05-25 DIAGNOSIS — I10 Essential (primary) hypertension: Secondary | ICD-10-CM | POA: Diagnosis not present

## 2020-05-25 DIAGNOSIS — E782 Mixed hyperlipidemia: Secondary | ICD-10-CM

## 2020-05-25 DIAGNOSIS — R0789 Other chest pain: Secondary | ICD-10-CM | POA: Insufficient documentation

## 2020-05-25 DIAGNOSIS — E088 Diabetes mellitus due to underlying condition with unspecified complications: Secondary | ICD-10-CM

## 2020-05-25 HISTORY — DX: Other chest pain: R07.89

## 2020-05-25 HISTORY — DX: Morbid (severe) obesity due to excess calories: E66.01

## 2020-05-25 HISTORY — DX: Diabetes mellitus due to underlying condition with unspecified complications: E08.8

## 2020-05-25 MED ORDER — NITROGLYCERIN 0.4 MG SL SUBL: 0.4000 mg | SUBLINGUAL_TABLET | SUBLINGUAL | 3 refills | Status: DC | PRN

## 2020-05-25 NOTE — Progress Notes (Signed)
Cardiology Office Note:    Date:  05/25/2020   ID:  CACE OSORTO, DOB 1950-11-18, MRN [id number]  PCP:  Sherren Mocha, MD  Cardiologist:  Garwin Brothers, MD   Referring MD: Gailen Shelter, PA    ASSESSMENT:    1. Essential hypertension   2. Mixed hyperlipidemia   3. Chest pain, unspecified type   4. Diabetes mellitus due to underlying condition with unspecified complications (HCC)   5. Chest discomfort    PLAN:    In order of problems listed above:  1. Primary prevention stressed with the patient.  Importance of compliance with diet medication stressed and he vocalized understanding. 2. Chest discomfort: His symptoms are atypical however in view of multiple risk factors she will undergo Lexiscan sestamibi.  Echo report from recent was reviewed with him at length. 3. Essential hypertension: Blood pressure stable 4. Mixed dyslipidemia: Diabetes mellitus: Obesity: I discussed diet extensively.  Risks of these conditions were emphasized with him at extensive length and he vocalized understanding.  He promises to comply and do better.  Lipids and hemoglobin A1c was reviewed from [facility] sheet.   5. Sublingual nitroglycerin prescription was sent, its protocol and 911 protocol explained and the patient vocalized understanding questions were answered to the patient's satisfaction.  I told him about its interaction with medication such as Viagra and told him to refrain from using it when he has used one medicine he should not use the other for at least 24 hours and he understands and promises to comply 6. Patient will be seen in follow-up appointment in 1 month or earlier if the patient has any concerns    Medication Adjustments/Labs and Tests Ordered: Current medicines are reviewed at length with the patient today.  Concerns regarding medicines are outlined above.  Orders Placed This Encounter  Procedures   MYOCARDIAL PERFUSION IMAGING   Meds ordered this encounter  Medications    nitroGLYCERIN (NITROSTAT) 0.4 MG SL tablet    Sig: Place 1 tablet (0.4 mg total) under the tongue every 5 (five) minutes as needed.    Dispense:  25 tablet    Refill:  3     History of Present Illness:    Troy Obrien is a 69 y.o. male who is being seen today for the evaluation of chest discomfort at the request of Gailen Shelter, [place].  Patient is a pleasant 69 year old male.  He has past medical history of essential hypertension dyslipidemia diabetes mellitus erectile dysfunction and morbid obesity.  He leads a sedentary lifestyle.  He denies any orthopnea or PND.  Patient mentions to me that he was driving and had substernal chest tightness like stabbing-like sensation for which reason he went to the emergency room.  There he was evaluated treated and released.  Subsequently is done fine and has not had a recurrence.  No chest pain orthopnea or PND since.  At the time of my evaluation, the patient is alert awake oriented and in no distress.  Noticed his wife to be very supportive and is involved in his care.  Past Medical History:  Diagnosis Date   Arthritis    Bradycardia 01/06/2019   Glaucoma    Gout    Headache(784.0)    Hyperlipidemia    Hypertension    Inguinal hernia    Rickets, fetal    Vitamin D deficiency     Past Surgical History:  Procedure Laterality Date   carpel tunnel     bilateral  CIRCUMCISION  1972   INGUINAL HERNIA REPAIR  10/11/2012   Procedure: HERNIA REPAIR INGUINAL ADULT;  Surgeon: Robyne Askew, MD;  Location: [facility] OR;  Service: General;  Laterality: Right;   INSERTION OF MESH  10/11/2012   Procedure: INSERTION OF MESH;  Surgeon: Robyne Askew, MD;  Location: [facility] OR;  Service: General;  Laterality: Right;   TONSILLECTOMY  1963    Current Medications: Current Meds  Medication Sig   amLODipine-olmesartan (AZOR) 5-20 MG tablet Take 1 tablet by mouth daily.   aspirin 81 MG tablet Take 81 mg by mouth daily.   azelastine (ASTELIN) 0.1  % nasal spray Place 1 spray into both nostrils 2 (two) times daily. Use in each nostril as directed   brimonidine (ALPHAGAN) 0.2 % ophthalmic solution Place 1 drop into both eyes 2 (two) times daily.   dorzolamide-timolol (COSOPT) 22.3-6.8 MG/ML ophthalmic solution Place 1 drop into both eyes 2 (two) times daily.   HYDROcodone-acetaminophen (NORCO) 10-325 MG tablet Take 1-2 tablets by mouth every 6 (six) hours as needed for moderate pain.   ipratropium (ATROVENT) 0.03 % nasal spray Place 2 sprays into the nose 4 (four) times daily.   latanoprost (XALATAN) 0.005 % ophthalmic solution Place 1 drop into both eyes at bedtime.   nystatin (MYCOSTATIN) 100000 UNIT/ML suspension Take 5 mLs (500,000 Units total) by mouth 4 (four) times daily.   pravastatin (PRAVACHOL) 40 MG tablet Take 1 tablet (40 mg total) by mouth daily. TAKE 1 TABLET BY MOUTH EVERY DAY AT BEDTIME   sildenafil (REVATIO) 20 MG tablet Take 2 tablets (40 mg total) by mouth as needed. 30 minutes prior to activity no more than every 24 hours   testosterone cypionate (DEPOTESTOSTERONE CYPIONATE) 200 MG/ML injection Inject 1 mL (200 mg total) into the muscle every 28 (twenty-eight) days. NEED OFFICE VISIT WITH LABS IN 6 WEEKS OR 10 WEEKS DUE TO DOSE CHANGE.   Vitamin D, Ergocalciferol, (DRISDOL) 1.25 MG (50000 UT) CAPS capsule Take 1 capsule (50,000 Units total) by mouth every 7 (seven) days.   Current Facility-Administered Medications for the 05/25/20 encounter (Office Visit) with Amillia Biffle, Aundra Dubin, MD  Medication   testosterone cypionate (DEPOTESTOSTERONE CYPIONATE) injection 100 mg     Allergies:   Laxative [bisacodyl] and Cyclobenzaprine   Social History   Socioeconomic History   Marital status: Married    Spouse name: Not on file   Number of children: 0   Years of education: Not on file   Highest education level: Not on file  Occupational History   Not on file  Tobacco Use   Smoking status: Never Smoker    Smokeless tobacco: Never Used  Vaping Use   Vaping Use: Never used  Substance and Sexual Activity   Alcohol use: Yes    Alcohol/week: 0.0 standard drinks    Comment: rare   Drug use: No   Sexual activity: Yes    Birth control/protection: None  Other Topics Concern   Not on file  Social History Narrative   Not on file   Social Determinants of Health   Financial Resource Strain:    Difficulty of Paying Living Expenses:   Food Insecurity:    Worried About Programme researcher, broadcasting/film/video in the Last Year:    Barista in the Last Year:   Transportation Needs:    Freight forwarder (Medical):    Lack of Transportation (Non-Medical):   Physical Activity:    Days of Exercise per Week:  Minutes of Exercise per Session:   Stress:    Feeling of Stress :   Social Connections:    Frequency of Communication with Friends and Family:    Frequency of Social Gatherings with Friends and Family:    Attends Religious Services:    Active Member of Clubs or Organizations:    Attends Banker Meetings:    Marital Status:      Family History: The patient's family history is not on file.  ROS:   Please see the history of present illness.    All other systems reviewed and are negative.  EKGs/Labs/Other Studies Reviewed:    The following studies were reviewed today: EKG and other emergency room records were reviewed extensively.  EKG reveals sinus rhythm posterior wall myocardial infarction of undetermined age and nonspecific ST-T changes.  Echocardiogram report was reviewed from June 2020.   Recent Labs: 05/17/2020: BUN 22; Creatinine, Ser 1.57; Hemoglobin 16.5; Platelets 246; Potassium 4.3; Sodium 137  Recent Lipid Panel    Component Value Date/Time   CHOL 160 09/06/2017 1739   TRIG 86 09/06/2017 1739   HDL 52 09/06/2017 1739   CHOLHDL 3.1 09/06/2017 1739   CHOLHDL 3.7 01/23/2016 0831   VLDL 25 01/23/2016 0831   LDLCALC 91 09/06/2017 1739    LDLDIRECT 114 (H) 09/11/2018 0917    Physical Exam:    VS:  BP (!) 130/70 (BP Location: Left Arm, Patient Position: Sitting, Cuff Size: Large)    Pulse 60    Ht 5\' 7"  (1.702 m)    Wt (!) 258 lb (117 kg)    SpO2 93%    BMI 40.41 kg/m     Wt Readings from Last 3 Encounters:  05/25/20 (!) 258 lb (117 kg)  05/17/20 250 lb (113.4 kg)  01/07/19 235 lb (106.6 kg)     GEN: Patient is in no acute distress HEENT: Normal NECK: No JVD; No carotid bruits LYMPHATICS: No lymphadenopathy CARDIAC: S1 S2 regular, 2/6 systolic murmur at the apex. RESPIRATORY:  Clear to auscultation without rales, wheezing or rhonchi  ABDOMEN: Soft, non-tender, non-distended MUSCULOSKELETAL:  No edema; No deformity  SKIN: Warm and dry NEUROLOGIC:  Alert and oriented x 3 PSYCHIATRIC:  Normal affect    Signed, 03/09/19, MD  05/25/2020 9:45 AM    [facility]

## 2020-05-25 NOTE — Patient Instructions (Addendum)
Medication Instructions:  Your physician has recommended you make the following change in your medication:  1.  START Nitroglycerin 0.4 s/l tablets.  USE ONLY AS NEEDED AS DIRECTED ON THE BOTTLE.. You must wait 48 hours in between using Nitroglycerin and Viagra..      *If you need a refill on your cardiac medications before your next appointment, please call your pharmacy*   Lab Work: None ordered  If you have labs (blood work) drawn today and your tests are completely normal, you will receive your results only by: Marland Kitchen MyChart Message (if you have MyChart) OR . A paper copy in the mail If you have any lab test that is abnormal or we need to change your treatment, we will call you to review the results.   Testing/Procedures: Your physician has requested that you have a lexiscan myoview. For further information please visit [email or web address] Please follow instruction sheet, as given.     Follow-Up: At [facility], you and your health needs are our priority.  As part of our continuing mission to provide you with exceptional heart care, we have created designated Provider Care Teams.  These Care Teams include your primary Cardiologist (physician) and Advanced Practice Providers (APPs -  Physician Assistants and Nurse Practitioners) who all work together to provide you with the care you need, when you need it.  We recommend signing up for the patient portal called "MyChart".  Sign up information is provided on this After Visit Summary.  MyChart is used to connect with patients for Virtual Visits (Telemedicine).  Patients are able to view lab/test results, encounter notes, upcoming appointments, etc.  Non-urgent messages can be sent to your provider as well.   To learn more about what you can do with MyChart, go to [email or web address].    Your next appointment:   1 month(s)  The format for your next appointment:   In Person  Provider:   Belva Crome, MD   Other  Instructions  Cardiac Nuclear Scan A cardiac nuclear scan is a test that is done to check the flow of blood to your heart. It is done when you are resting and when you are exercising. The test looks for problems such as:  Not enough blood reaching a portion of the heart.  The heart muscle not working as it should. You may need this test if:  You have heart disease.  You have had lab results that are not normal.  You have had heart surgery or a balloon procedure to open up blocked arteries (angioplasty).  You have chest pain.  You have shortness of breath. In this test, a special dye (tracer) is put into your bloodstream. The tracer will travel to your heart. A camera will then take pictures of your heart to see how the tracer moves through your heart. This test is usually done at a hospital and takes 2-4 hours. Tell a doctor about:  Any allergies you have.  All medicines you are taking, including vitamins, herbs, eye drops, creams, and over-the-counter medicines.  Any problems you or family members have had with anesthetic medicines.  Any blood disorders you have.  Any surgeries you have had.  Any medical conditions you have.  Whether you are pregnant or may be pregnant. What are the risks? Generally, this is a safe test. However, problems may occur, such as:  Serious chest pain and heart attack. This is only a risk if the stress portion of the test is done.  Rapid heartbeat.  A feeling of warmth in your chest. This feeling usually does not last long.  Allergic reaction to the tracer. What happens before the test?  Ask your doctor about changing or stopping your normal medicines. This is important.  Follow instructions from your doctor about what you cannot eat or drink.  Remove your jewelry on the day of the test. What happens during the test?  An IV tube will be inserted into one of your veins.  Your doctor will give you a small amount of tracer through the  IV tube.  You will wait for 20-40 minutes while the tracer moves through your bloodstream.  Your heart will be monitored with an electrocardiogram (ECG).  You will lie down on an exam table.  Pictures of your heart will be taken for about 15-20 minutes.  You may also have a stress test. For this test, one of these things may be done: ? You will be asked to exercise on a treadmill or a stationary bike. ? You will be given medicines that will make your heart work harder. This is done if you are unable to exercise.  When blood flow to your heart has peaked, a tracer will again be given through the IV tube.  After 20-40 minutes, you will get back on the exam table. More pictures will be taken of your heart.  Depending on the tracer that is used, more pictures may need to be taken 3-4 hours later.  Your IV tube will be removed when the test is over. The test may vary among doctors and hospitals. What happens after the test?  Ask your doctor: ? Whether you can return to your normal schedule, including diet, activities, and medicines. ? Whether you should drink more fluids. This will help to remove the tracer from your body. Drink enough fluid to keep your pee (urine) pale yellow.  Ask your doctor, or the department that is doing the test: ? When will my results be ready? ? How will I get my results? Summary  A cardiac nuclear scan is a test that is done to check the flow of blood to your heart.  Tell your doctor whether you are pregnant or may be pregnant.  Before the test, ask your doctor about changing or stopping your normal medicines. This is important.  Ask your doctor whether you can return to your normal activities. You may be asked to drink more fluids. This information is not intended to replace advice given to you by your health care provider. Make sure you discuss any questions you have with your health care provider. Document Revised: 02/06/2019 Document Reviewed:  04/02/2018 Elsevier Patient Education  2020 [employer].

## 2020-05-26 ENCOUNTER — Telehealth (HOSPITAL_COMMUNITY): Payer: Self-pay | Admitting: *Deleted

## 2020-05-26 NOTE — Telephone Encounter (Signed)
Patient given detailed instructions per Myocardial Perfusion Study Information Sheet for the test on 05/28/20 Patient notified to arrive 15 minutes early and that it is imperative to arrive on time for appointment to keep from having the test rescheduled.  If you need to cancel or reschedule your appointment, please call the office within 24 hours of your appointment. . Patient verbalized understanding. Ricky Ala

## 2020-05-28 ENCOUNTER — Other Ambulatory Visit: Payer: Self-pay

## 2020-05-28 ENCOUNTER — Ambulatory Visit (HOSPITAL_COMMUNITY): Payer: Medicare Other | Attending: Cardiology

## 2020-05-28 DIAGNOSIS — R079 Chest pain, unspecified: Secondary | ICD-10-CM | POA: Diagnosis not present

## 2020-05-28 LAB — MYOCARDIAL PERFUSION IMAGING
LV dias vol: 99 mL (ref 62–150)
LV sys vol: 37 mL
Peak HR: 74 {beats}/min
Rest HR: 58 {beats}/min
SDS: 0
SRS: 2
SSS: 2
TID: 0.95

## 2020-05-28 MED ORDER — REGADENOSON 0.4 MG/5ML IV SOLN
0.4000 mg | Freq: Once | INTRAVENOUS | Status: AC
  Administered 2020-05-28: 0.4 mg via INTRAVENOUS

## 2020-05-28 MED ORDER — TECHNETIUM TC 99M TETROFOSMIN IV KIT
31.6000 | PACK | Freq: Once | INTRAVENOUS | Status: AC | PRN
  Administered 2020-05-28: 31.6 via INTRAVENOUS
  Filled 2020-05-28: qty 32

## 2020-05-28 MED ORDER — TECHNETIUM TC 99M TETROFOSMIN IV KIT
10.1000 | PACK | Freq: Once | INTRAVENOUS | Status: AC | PRN
  Administered 2020-05-28: 10.1 via INTRAVENOUS
  Filled 2020-05-28: qty 11

## 2020-05-29 ENCOUNTER — Telehealth: Payer: Self-pay

## 2020-05-29 NOTE — Telephone Encounter (Signed)
-----   Message from Garwin Brothers, MD sent at 05/29/2020 10:11 AM EDT ----- The results of the study is unremarkable. Please inform patient. I will discuss in detail at next appointment. Cc  primary care/referring physician Garwin Brothers, MD 05/29/2020 10:11 AM

## 2020-05-29 NOTE — Telephone Encounter (Signed)
Spoke with patient regarding results and recommendation.  Patient verbalizes understanding and is agreeable to plan of care. Advised patient to call back with any issues or concerns.  

## 2020-06-26 ENCOUNTER — Other Ambulatory Visit: Payer: Self-pay

## 2020-06-26 ENCOUNTER — Ambulatory Visit: Payer: Medicare Other | Admitting: Cardiology

## 2020-06-26 ENCOUNTER — Encounter: Payer: Self-pay | Admitting: Cardiology

## 2020-06-26 VITALS — BP 128/76 | HR 72 | Ht 67.5 in | Wt 253.1 lb

## 2020-06-26 DIAGNOSIS — I10 Essential (primary) hypertension: Secondary | ICD-10-CM | POA: Diagnosis not present

## 2020-06-26 DIAGNOSIS — E782 Mixed hyperlipidemia: Secondary | ICD-10-CM | POA: Diagnosis not present

## 2020-06-26 NOTE — Progress Notes (Signed)
Cardiology Office Note:    Date:  06/26/2020   ID:  Troy Obrien, DOB 09-Nov-1950, MRN [id number]  PCP:  Sherren Mocha, MD  Cardiologist:  Garwin Brothers, MD   Referring MD: Sherren Mocha, MD    ASSESSMENT:    1. Mixed hyperlipidemia   2. Essential hypertension   3. Morbid obesity due to excess calories (HCC)    PLAN:    In order of problems listed above:  1. Primary prevention stressed with patient.  Importance of compliance with diet medication stressed vocalized understanding. 2. Essential hypertension: Blood pressure stable diet was mentioned in lifestyle modification 3. Mixed dyslipidemia and abdominal obesity: I discussed my findings with the patient in extensive length and he promises to do better with diet and exercise.  Recent lipid work was reviewed and discussed with the patient. 4. Patient will be seen in follow-up appointment in 6 months or earlier if the patient has any concerns    Medication Adjustments/Labs and Tests Ordered: Current medicines are reviewed at length with the patient today.  Concerns regarding medicines are outlined above.  No orders of the defined types were placed in this encounter.  No orders of the defined types were placed in this encounter.    No chief complaint on file.    History of Present Illness:    Troy Obrien is a 69 y.o. male.  Patient has past medical history of essential hypertension and dyslipidemia and obesity..  Patient denies any problems at this time and takes care of activities of daily living.  He is walking on a regular basis.  No chest pain orthopnea or PND.  At the time of my evaluation, the patient is alert awake oriented and in no distress.  Past Medical History:  Diagnosis Date  . Arthritis   . Bradycardia 01/06/2019  . Glaucoma   . Gout   . Headache(784.0)   . Hyperlipidemia   . Hypertension   . Inguinal hernia   . Rickets, fetal   . Vitamin D deficiency     Past Surgical History:  Procedure  Laterality Date  . carpel tunnel     bilateral  . CIRCUMCISION  1972  . INGUINAL HERNIA REPAIR  10/11/2012   Procedure: HERNIA REPAIR INGUINAL ADULT;  Surgeon: Robyne Askew, MD;  Location: [facility] OR;  Service: General;  Laterality: Right;  . INSERTION OF MESH  10/11/2012   Procedure: INSERTION OF MESH;  Surgeon: Robyne Askew, MD;  Location: [facility] OR;  Service: General;  Laterality: Right;  . TONSILLECTOMY  1963    Current Medications: Current Meds  Medication Sig  . aspirin 81 MG tablet Take 81 mg by mouth daily.  . brimonidine (ALPHAGAN) 0.2 % ophthalmic solution Place 1 drop into both eyes 2 (two) times daily.  . dorzolamide (TRUSOPT) 2 % ophthalmic solution Place 1 drop into the left eye 3 (three) times daily.  . furosemide (LASIX) 40 MG tablet Take 40 mg by mouth every morning.  Marland Kitchen HYDROcodone-acetaminophen (NORCO) 10-325 MG tablet Take 1-2 tablets by mouth every 6 (six) hours as needed for moderate pain.  Marland Kitchen latanoprost (XALATAN) 0.005 % ophthalmic solution Place 1 drop into both eyes at bedtime.  . nitroGLYCERIN (NITROSTAT) 0.4 MG SL tablet Place 1 tablet (0.4 mg total) under the tongue every 5 (five) minutes as needed.  . Olmesartan-amLODIPine-HCTZ 40-10-25 MG TABS Take 1 tablet by mouth daily.  Marland Kitchen omeprazole (PRILOSEC) 40 MG capsule Take 40 mg by mouth  daily.  . potassium chloride SA (KLOR-CON) 20 MEQ tablet Take 20 mEq by mouth daily.  . sildenafil (REVATIO) 20 MG tablet Take 2 tablets (40 mg total) by mouth as needed. 30 minutes prior to activity no more than every 24 hours  . testosterone cypionate (DEPOTESTOSTERONE CYPIONATE) 200 MG/ML injection Inject 1 mL (200 mg total) into the muscle every 28 (twenty-eight) days. NEED OFFICE VISIT WITH LABS IN 6 WEEKS OR 10 WEEKS DUE TO DOSE CHANGE.  Marland Kitchen Vitamin D, Ergocalciferol, (DRISDOL) 1.25 MG (50000 UT) CAPS capsule Take 1 capsule (50,000 Units total) by mouth every 7 (seven) days.   Current Facility-Administered Medications for the  06/26/20 encounter (Office Visit) with Richard Ritchey, Aundra Dubin, MD  Medication  . testosterone cypionate (DEPOTESTOSTERONE CYPIONATE) injection 100 mg     Allergies:   Laxative [bisacodyl] and Cyclobenzaprine   Social History   Socioeconomic History  . Marital status: Married    Spouse name: Not on file  . Number of children: 0  . Years of education: Not on file  . Highest education level: Not on file  Occupational History  . Not on file  Tobacco Use  . Smoking status: Never Smoker  . Smokeless tobacco: Never Used  Vaping Use  . Vaping Use: Never used  Substance and Sexual Activity  . Alcohol use: Yes    Alcohol/week: 0.0 standard drinks    Comment: rare  . Drug use: No  . Sexual activity: Yes    Birth control/protection: None  Other Topics Concern  . Not on file  Social History Narrative  . Not on file   Social Determinants of Health   Financial Resource Strain:   . Difficulty of Paying Living Expenses: Not on file  Food Insecurity:   . Worried About Programme researcher, broadcasting/film/video in the Last Year: Not on file  . Ran Out of Food in the Last Year: Not on file  Transportation Needs:   . Lack of Transportation (Medical): Not on file  . Lack of Transportation (Non-Medical): Not on file  Physical Activity:   . Days of Exercise per Week: Not on file  . Minutes of Exercise per Session: Not on file  Stress:   . Feeling of Stress : Not on file  Social Connections:   . Frequency of Communication with Friends and Family: Not on file  . Frequency of Social Gatherings with Friends and Family: Not on file  . Attends Religious Services: Not on file  . Active Member of Clubs or Organizations: Not on file  . Attends Banker Meetings: Not on file  . Marital Status: Not on file     Family History: The patient's family history is not on file.  ROS:   Please see the history of present illness.    All other systems reviewed and are negative.  EKGs/Labs/Other Studies Reviewed:      The following studies were reviewed today: Study Highlights    The left ventricular ejection fraction is normal (55-65%).  Nuclear stress EF: 63%.  There was no ST segment deviation noted during stress.  The study is normal.  This is a low risk study.   Normal stress nuclear study with no ischemia or infarction.  Gated ejection fraction 63% with normal wall motion.    Recent Labs: 05/17/2020: BUN 22; Creatinine, Ser 1.57; Hemoglobin 16.5; Platelets 246; Potassium 4.3; Sodium 137  Recent Lipid Panel    Component Value Date/Time   CHOL 160 09/06/2017 1739   TRIG  86 09/06/2017 1739   HDL 52 09/06/2017 1739   CHOLHDL 3.1 09/06/2017 1739   CHOLHDL 3.7 01/23/2016 0831   VLDL 25 01/23/2016 0831   LDLCALC 91 09/06/2017 1739   LDLDIRECT 114 (H) 09/11/2018 0917    Physical Exam:    VS:  BP 128/76   Pulse 72   Ht 5' 7.5" (1.715 m)   Wt 253 lb 1.9 oz (114.8 kg)   SpO2 96%   BMI 39.06 kg/m     Wt Readings from Last 3 Encounters:  06/26/20 253 lb 1.9 oz (114.8 kg)  05/28/20 (!) 258 lb (117 kg)  05/25/20 (!) 258 lb (117 kg)     GEN: Patient is in no acute distress HEENT: Normal NECK: No JVD; No carotid bruits LYMPHATICS: No lymphadenopathy CARDIAC: Hear sounds regular, 2/6 systolic murmur at the apex. RESPIRATORY:  Clear to auscultation without rales, wheezing or rhonchi  ABDOMEN: Soft, non-tender, non-distended MUSCULOSKELETAL:  No edema; No deformity  SKIN: Warm and dry NEUROLOGIC:  Alert and oriented x 3 PSYCHIATRIC:  Normal affect   Signed, Garwin Brothers, MD  06/26/2020 2:55 PM    [facility]

## 2020-06-26 NOTE — Patient Instructions (Signed)

## 2020-08-04 ENCOUNTER — Other Ambulatory Visit: Payer: Self-pay

## 2020-08-04 ENCOUNTER — Encounter: Payer: Self-pay | Admitting: Dietician

## 2020-08-04 ENCOUNTER — Encounter: Payer: Medicare Other | Attending: Family Medicine | Admitting: Dietician

## 2020-08-04 DIAGNOSIS — E119 Type 2 diabetes mellitus without complications: Secondary | ICD-10-CM | POA: Insufficient documentation

## 2020-08-04 NOTE — Patient Instructions (Addendum)
Consider 2000 IU (50 mg) vitamin D daily.  Serve your own plates Listen to your body.  Stop eating when you are full. If you are not hungry avoid snacking but a small snack when hungry is fine. Eat a way you can maintain.   Eat more Non-Starchy Vegetables . These include greens, broccoli, cauliflower, cabbage, carrots, beets, eggplant, peppers, squash and others. Minimize added sugars and refined grains . Rethink what you drink.  Choose beverages without added sugar.  Look for 0 carbs on the label. . See the list of whole grains below.  Find alternatives to usual sweet treats. Choose whole foods over processed. Make simple meals at home more often than eating out.  Tips to increase fiber in your diet: (All plants have fiber.  Eat a variety. There are more than are on this list.) Slowly increase the amount of fiber you eat to 25-35 grams per day.  (More is fine if you tolerate it.) . Fiber from whole grains, nuts and seeds o Quinoa, 1/2 cup = 5 grams o Bulgur, 1/2 cup = 4.1 grams o Popcorn, 3 cups = 3.6 grams o Whole Wheat Spaghetti, 1/2 cup = 3.2 grams o Barley, 1/2 cup = 3 grams o Oatmeal, 1/2 cup = 2 grams o Whole Wheat English Muffin = 3 grams o Corn, 1/2 cup = 2.1 grams o Brown Rice, 1/2 cup = 1.8 grams o Flax seeds, 1 Tablespoon = 2.8 grams o Chia seeds, 1 Tablespoon = 11 grams o Almonds, 1 ounce = 3.5 grams fiber . Fiber from legumes o Kidney beans, 1/2 cup 7.9 grams o Lentils, 1/2 cup = 7.8 grams o Pinto beans, 1/2 cup = 7.7 grams o Black beans, 1/2 cup = 7.6 grams o Lima beans, 1/2 cup 6.4 grams o Chick peas, 1/2 cup = 5.3 grams o Black eyed peas, 1/2 cup = 4 grams . Fiber from fruits and vegetables o Pear, 6 grams o Apple. 3.3 grams o Raspberries or Blackberries, 3/4 cup = 6 grams o Strawberries or Blueberries, 1 cup = 3.4 grams o Baked sweet potato 3.8 grams fiber o Baked potato with skin 4.4 grams  o Peas, 1/2 cup = 4.4 grams  o Spinach, 1/2 cup cooked = 3.5  grams  o Avocado, 1/2 = 5 grams

## 2020-08-04 NOTE — Progress Notes (Signed)
Medical Nutrition Therapy:  Appt start time: 1115 end time:  1240.   Assessment:  Primary concerns today: .  Patient is here today with his wife.  They are to learn more about diabetes and weight loss.  History includes Type 2 Diabetes, HTN, and HLD Labs noted and include A1C 7.4% 04/06/2020 07/14/2020 BUN 40, Creatinine 2.12, eGFR 36  Weight hx: UBW 252-254 lbs 04/2020  258 lbs highest adult weight 230 lbs lowest adult weight.  69 yo  Body Composition Scale Aug 04, 2020  Current Body Weight 250.8  Total Body Fat % 35.4  Visceral Fat 30  Fat-Free Mass % 64.5   Total Body Water % 45.5  Muscle-Mass lbs 47.2  BMI 39  Body Fat Displacement          Torso  lbs 55.1         Left Leg  lbs 11         Right Leg  lbs 11         Left Arm  lbs 5.5         Right Arm   lbs 5.5    Patient lives with his wife.  She does the shopping and cooking.  She replaced sugar with Xyletol about 6 years ago.  She follows weight watchers.  Patient states that his wife always serves him twice or 3 times what he wants to eat but eats it anyway and becomes very uncomfortable.  He also started eating in bed which he did not do prior to marriage and does not prefer this. They had a very open conversation about this during our session. He is retired from [employer].  Preferred Learning Style:   No preference indicated   Learning Readiness:   Ready  MEDICATIONS: see list to include Metformin  24 Hr. Recall: Breakfast: (11-12 am) [place] and cheese sandwich on white with smal amount of mayo Snack:  None Lunch:  Skips Snack:  NABS occasionally or banana Dinner:  Large portion baked or grilled chicken or fried fish or occasional chicken wings, brown rice, greens OR rare Pizza Snack:  Grapes, pork skins in bed Water, coffee with xyletol  Physical activity:  Walks 20 minutes 7 days per week  Progress Towards Goal(s):  In progress.   Nutritional Diagnosis:  NB-1.1 Food and nutrition-related knowledge  deficit As related to balance of carbohydrate, protein, adn fat.  As evidenced by diet hx and patient report.    Intervention:  Nutrition counseling and diabetes education initiated. Discussed Carb Counting by food group as method of portion control, reading food labels, and benefits of increased activity. Also discussed basic physiology of Diabetes, target BG ranges pre and post meals, and A1c.  Discussed mindful eating.  Plan: Consider 2000 IU (50 mg) vitamin D daily.  Serve your own plates Listen to your body.  Stop eating when you are full. If you are not hungry avoid snacking but a small snack when hungry is fine. Eat a way you can maintain.   Eat more Non-Starchy Vegetables . These include greens, broccoli, cauliflower, cabbage, carrots, beets, eggplant, peppers, squash and others. Minimize added sugars and refined grains . Rethink what you drink.  Choose beverages without added sugar.  Look for 0 carbs on the label. . See the list of whole grains below.  Find alternatives to usual sweet treats. Choose whole foods over processed. Make simple meals at home more often than eating out.  Tips to increase fiber in your diet: (All  plants have fiber.  Eat a variety. There are more than are on this list.) Slowly increase the amount of fiber you eat to 25-35 grams per day.  (More is fine if you tolerate it.) . Fiber from whole grains, nuts and seeds o Quinoa, 1/2 cup = 5 grams o Bulgur, 1/2 cup = 4.1 grams o Popcorn, 3 cups = 3.6 grams o Whole Wheat Spaghetti, 1/2 cup = 3.2 grams o Barley, 1/2 cup = 3 grams o Oatmeal, 1/2 cup = 2 grams o Whole Wheat English Muffin = 3 grams o Corn, 1/2 cup = 2.1 grams o Brown Rice, 1/2 cup = 1.8 grams o Flax seeds, 1 Tablespoon = 2.8 grams o Chia seeds, 1 Tablespoon = 11 grams o Almonds, 1 ounce = 3.5 grams fiber . Fiber from legumes o Kidney beans, 1/2 cup 7.9 grams o Lentils, 1/2 cup = 7.8 grams o Pinto beans, 1/2 cup = 7.7 grams o Black  beans, 1/2 cup = 7.6 grams o Lima beans, 1/2 cup 6.4 grams o Chick peas, 1/2 cup = 5.3 grams o Black eyed peas, 1/2 cup = 4 grams . Fiber from fruits and vegetables o Pear, 6 grams o Apple. 3.3 grams o Raspberries or Blackberries, 3/4 cup = 6 grams o Strawberries or Blueberries, 1 cup = 3.4 grams o Baked sweet potato 3.8 grams fiber o Baked potato with skin 4.4 grams  o Peas, 1/2 cup = 4.4 grams  o Spinach, 1/2 cup cooked = 3.5 grams  o Avocado, 1/2 = 5 grams o  Teaching Method Utilized:  Visual Auditory  Handouts given during visit include: How to Thrive:  A Guide for Your Journey with Diabetes by the ADA Meal Plan Card Snack list  Barriers to learning/adherence to lifestyle change: none  Demonstrated degree of understanding via:  Teach Back   Monitoring/Evaluation:  Dietary intake, exercise, and body weight prn.

## 2020-12-15 HISTORY — PX: TRABECULECTOMY: SHX107

## 2020-12-25 DIAGNOSIS — M65312 Trigger thumb, left thumb: Secondary | ICD-10-CM

## 2020-12-25 HISTORY — DX: Trigger thumb, left thumb: M65.312

## 2021-01-11 ENCOUNTER — Ambulatory Visit: Payer: Medicare Other | Admitting: Cardiology

## 2021-02-24 ENCOUNTER — Other Ambulatory Visit: Payer: Self-pay

## 2021-02-24 DIAGNOSIS — Q774 Achondroplasia: Secondary | ICD-10-CM | POA: Insufficient documentation

## 2021-02-24 DIAGNOSIS — I10 Essential (primary) hypertension: Secondary | ICD-10-CM | POA: Insufficient documentation

## 2021-02-24 DIAGNOSIS — K4090 Unilateral inguinal hernia, without obstruction or gangrene, not specified as recurrent: Secondary | ICD-10-CM | POA: Insufficient documentation

## 2021-02-24 DIAGNOSIS — E119 Type 2 diabetes mellitus without complications: Secondary | ICD-10-CM | POA: Insufficient documentation

## 2021-02-24 DIAGNOSIS — M109 Gout, unspecified: Secondary | ICD-10-CM | POA: Insufficient documentation

## 2021-02-24 DIAGNOSIS — M1990 Unspecified osteoarthritis, unspecified site: Secondary | ICD-10-CM | POA: Insufficient documentation

## 2021-03-02 ENCOUNTER — Ambulatory Visit: Payer: Medicare Other | Admitting: Cardiology

## 2021-03-30 DIAGNOSIS — R519 Headache, unspecified: Secondary | ICD-10-CM | POA: Insufficient documentation

## 2021-03-31 ENCOUNTER — Encounter: Payer: Self-pay | Admitting: *Deleted

## 2021-03-31 ENCOUNTER — Other Ambulatory Visit: Payer: Self-pay

## 2021-03-31 ENCOUNTER — Ambulatory Visit: Payer: Medicare Other | Admitting: Cardiology

## 2021-03-31 VITALS — BP 110/70 | HR 60 | Ht 67.5 in | Wt 242.0 lb

## 2021-03-31 DIAGNOSIS — E782 Mixed hyperlipidemia: Secondary | ICD-10-CM | POA: Diagnosis not present

## 2021-03-31 DIAGNOSIS — E088 Diabetes mellitus due to underlying condition with unspecified complications: Secondary | ICD-10-CM | POA: Diagnosis not present

## 2021-03-31 DIAGNOSIS — I10 Essential (primary) hypertension: Secondary | ICD-10-CM

## 2021-03-31 DIAGNOSIS — E669 Obesity, unspecified: Secondary | ICD-10-CM

## 2021-03-31 HISTORY — DX: Obesity, unspecified: E66.9

## 2021-03-31 MED ORDER — OLMESARTAN MEDOXOMIL-HCTZ 40-25 MG PO TABS: 1.0000 | ORAL_TABLET | Freq: Every day | ORAL | 3 refills | Status: DC

## 2021-03-31 NOTE — Progress Notes (Signed)
Cardiology Office Note:    Date:  03/31/2021   ID:  Troy Obrien, DOB 1951-09-29, MRN [id number]  PCP:  Sherren Mocha, MD  Cardiologist:  Garwin Brothers, MD   Referring MD: Sherren Mocha, MD    ASSESSMENT:    1. Essential hypertension   2. Mixed hyperlipidemia   3. Diabetes mellitus due to underlying condition with unspecified complications (HCC)   4. Obesity (BMI 35.0-39.9 without comorbidity)    PLAN:    In order of problems listed above:  1. Primary prevention stressed to the patient.  Importance of compliance with diet medication stressed any vocalized understanding.  He was advised to walk at least half an hour a day 5 days a week and he promises to do so. 2. Essential hypertension: Blood pressure stable and diet was emphasized.  His blood pressure actually is borderline and he complains of fatigue therefore I will stop the combination of olmesartan, amlodipine and hydrochlorothiazide and only keep olmesartan and hydrochlorothiazide.  He will keep a track of his blood pressures and will be back in 1 month for a follow-up appointment. 3. Mixed dyslipidemia and diabetes mellitus: Diet was advised.  He has abdominal obesity and I discussed the risks.  He vocalized understanding.  He will exercise and diet and I do lengthy conversation and counseling about it and he is agreeable to do his best. 4. Patient will be seen in follow-up appointment in 6 months or earlier if the patient has any concerns    Medication Adjustments/Labs and Tests Ordered: Current medicines are reviewed at length with the patient today.  Concerns regarding medicines are outlined above.  No orders of the defined types were placed in this encounter.  No orders of the defined types were placed in this encounter.    Chief Complaint  Patient presents with  . Follow-up     History of Present Illness:    Troy Obrien is a 70 y.o. male.  Patient has past medical history of essential hypertension,  dyslipidemia, diabetes obesity.  He denies any problems at this time and takes care of activities of daily living.  No chest pain orthopnea or PND.  At the time of my evaluation, the patient is alert awake oriented and in no distress.  He does not exercise on a regular basis.  He tells me that he feels tired most time of the day. Past Medical History:  Diagnosis Date  . Arthritis   . Arthritis of hand 07/08/2012  . Back pain 03/30/2012  . BPH associated with nocturia 05/10/2014  . Bradycardia 01/06/2019  . Chest discomfort 05/25/2020  . Chronic left-sided low back pain without sciatica 09/05/2017  . Chronic pain syndrome 04/19/2017  . Chronic right-sided thoracic back pain 03/26/2014  . Combined forms of age-related cataract, bilateral 07/27/2017  . Diabetes mellitus due to underlying condition with unspecified complications (HCC) 05/25/2020  . Diabetes mellitus without complication (HCC)   . Encounter for long-term current use of high risk medication 09/11/2018  . Erectile dysfunction 04/13/2014  . Essential hypertension 02/02/2012  . Fatigue 07/08/2012  . Glaucoma   . Gout   . Headache(784.0)   . Hernia 07/08/2012  . Hyperesthesia 09/11/2018  . Hyperlipidemia   . Hypersomnia with sleep apnea 02/17/2016  . Hypertension   . Inguinal hernia   . Inguinal hernia, right 03/30/2012  . Morbid obesity (HCC) 05/25/2020  . Morbid obesity due to excess calories (HCC) 02/17/2016  . Myalgia 09/11/2018  . Nasal obstruction  02/17/2016  . Nasal turbinate hypertrophy 09/05/2017  . Nocturia more than twice per night 02/17/2016  . Opioid type dependence, continuous (HCC) 04/19/2017  . Other chronic sinusitis 02/04/2015  . Perennial allergic rhinitis 02/04/2015  . Polypharmacy 04/13/2014  . Primary open angle glaucoma (POAG) of right eye, severe stage 05/29/2014   Formatting of this note might be different from the original. Added automatically from request for surgery [id number]  . Raynaud phenomenon 04/13/2014  . Rickets, fetal    . Right groin pain 02/20/2013  . Right upper quadrant pain 03/30/2012  . Sleep apnea 07/08/2012  . Sleep related headaches 02/17/2016  . Snoring 02/17/2016  . Vitamin D deficiency     Past Surgical History:  Procedure Laterality Date  . carpel tunnel     bilateral  . CATARACT EXTRACTION W/ INTRAOCULAR LENS IMPLANT Bilateral   . CIRCUMCISION  1972  . INGUINAL HERNIA REPAIR  10/11/2012   Procedure: HERNIA REPAIR INGUINAL ADULT;  Surgeon: Robyne Askew, MD;  Location: [facility] OR;  Service: General;  Laterality: Right;  . INSERTION OF MESH  10/11/2012   Procedure: INSERTION OF MESH;  Surgeon: Robyne Askew, MD;  Location: [facility] OR;  Service: General;  Laterality: Right;  . NASAL TURBINATE REDUCTION  2015  . TONSILLECTOMY  1963  . TRABECULECTOMY Right 11/19/2019   Surgeon: Tilden Dome, MD, [facility] OR/ Opthalmology  . TRABECULECTOMY Left 12/15/2020    Current Medications: Current Meds  Medication Sig  . aspirin 81 MG tablet Take 81 mg by mouth daily.  Marland Kitchen atorvastatin (LIPITOR) 40 MG tablet Take 1 tablet by mouth daily.  . brimonidine (ALPHAGAN) 0.2 % ophthalmic solution Place 1 drop into both eyes 2 (two) times daily.  . dorzolamide (TRUSOPT) 2 % ophthalmic solution Place 1 drop into the left eye 3 (three) times daily.  Marland Kitchen HYDROcodone-acetaminophen (NORCO) 10-325 MG tablet Take 1-2 tablets by mouth every 6 (six) hours as needed for moderate pain.  Marland Kitchen ipratropium (ATROVENT) 0.06 % nasal spray Place 2 sprays into both nostrils 4 (four) times daily.  Marland Kitchen levocetirizine (XYZAL) 5 MG tablet Take 10 mg by mouth every morning.  . metFORMIN (GLUCOPHAGE-XR) 500 MG 24 hr tablet Take 1,500 mg by mouth daily with breakfast.  . nitroGLYCERIN (NITROSTAT) 0.4 MG SL tablet Place 0.4 mg under the tongue as needed for chest pain.  . Olmesartan-amLODIPine-HCTZ 40-10-25 MG TABS Take 1 tablet by mouth daily.  Marland Kitchen testosterone cypionate (DEPOTESTOSTERONE CYPIONATE) 200 MG/ML injection Inject 1 mL (200 mg  total) into the muscle every 28 (twenty-eight) days. NEED OFFICE VISIT WITH LABS IN 6 WEEKS OR 10 WEEKS DUE TO DOSE CHANGE.     Allergies:   Laxative [bisacodyl] and Cyclobenzaprine   Social History   Socioeconomic History  . Marital status: Married    Spouse name: Not on file  . Number of children: 0  . Years of education: Not on file  . Highest education level: Not on file  Occupational History  . Not on file  Tobacco Use  . Smoking status: Never Smoker  . Smokeless tobacco: Never Used  Vaping Use  . Vaping Use: Never used  Substance and Sexual Activity  . Alcohol use: Yes    Alcohol/week: 0.0 standard drinks    Comment: rare  . Drug use: No  . Sexual activity: Yes    Birth control/protection: None  Other Topics Concern  . Not on file  Social History Narrative  . Not on file  Social Determinants of Health   Financial Resource Strain: Not on file  Food Insecurity: Not on file  Transportation Needs: Not on file  Physical Activity: Not on file  Stress: Not on file  Social Connections: Not on file     Family History: The patient's family history is not on file. He was adopted.  ROS:   Please see the history of present illness.    All other systems reviewed and are negative.  EKGs/Labs/Other Studies Reviewed:    The following studies were reviewed today: I discussed my findings with the patient at length.   Recent Labs: 05/17/2020: BUN 22; Creatinine, Ser 1.57; Hemoglobin 16.5; Platelets 246; Potassium 4.3; Sodium 137  Recent Lipid Panel    Component Value Date/Time   CHOL 160 09/06/2017 1739   TRIG 86 09/06/2017 1739   HDL 52 09/06/2017 1739   CHOLHDL 3.1 09/06/2017 1739   CHOLHDL 3.7 01/23/2016 0831   VLDL 25 01/23/2016 0831   LDLCALC 91 09/06/2017 1739   LDLDIRECT 114 (H) 09/11/2018 0917    Physical Exam:    VS:  BP 110/70 (BP Location: Right Arm, Patient Position: Sitting, Cuff Size: Large)   Pulse 60   Ht 5' 7.5" (1.715 m)   Wt 242 lb  (109.8 kg)   SpO2 94%   BMI 37.34 kg/m     Wt Readings from Last 3 Encounters:  03/31/21 242 lb (109.8 kg)  08/04/20 250 lb (113.4 kg)  06/26/20 253 lb 1.9 oz (114.8 kg)     GEN: Patient is in no acute distress HEENT: Normal NECK: No JVD; No carotid bruits LYMPHATICS: No lymphadenopathy CARDIAC: Hear sounds regular, 2/6 systolic murmur at the apex. RESPIRATORY:  Clear to auscultation without rales, wheezing or rhonchi  ABDOMEN: Soft, non-tender, non-distended MUSCULOSKELETAL:  No edema; No deformity  SKIN: Warm and dry NEUROLOGIC:  Alert and oriented x 3 PSYCHIATRIC:  Normal affect   Signed, Garwin Brothers, MD  03/31/2021 4:12 PM    [facility]

## 2021-03-31 NOTE — Patient Instructions (Signed)
Medication Instructions:  Your physician has recommended you make the following change in your medication:   Stop Olmesartan-amlodipine-hydrochlorothiazide Start Olmesartan-hydrochlorothiazide 40-25 mg daily.  *If you need a refill on your cardiac medications before your next appointment, please call your pharmacy*   Lab Work: None ordered If you have labs (blood work) drawn today and your tests are completely normal, you will receive your results only by: Marland Kitchen MyChart Message (if you have MyChart) OR . A paper copy in the mail If you have any lab test that is abnormal or we need to change your treatment, we will call you to review the results.   Testing/Procedures: None ordered   Follow-Up: At [facility], you and your health needs are our priority.  As part of our continuing mission to provide you with exceptional heart care, we have created designated Provider Care Teams.  These Care Teams include your primary Cardiologist (physician) and Advanced Practice Providers (APPs -  Physician Assistants and Nurse Practitioners) who all work together to provide you with the care you need, when you need it.  We recommend signing up for the patient portal called "MyChart".  Sign up information is provided on this After Visit Summary.  MyChart is used to connect with patients for Virtual Visits (Telemedicine).  Patients are able to view lab/test results, encounter notes, upcoming appointments, etc.  Non-urgent messages can be sent to your provider as well.   To learn more about what you can do with MyChart, go to [email or web address].    Your next appointment:   1 month(s)  The format for your next appointment:   In Person  Provider:   Belva Crome, MD   Other Instructions NA

## 2021-04-30 ENCOUNTER — Ambulatory Visit: Payer: Medicare Other | Admitting: Cardiology

## 2021-07-03 ENCOUNTER — Encounter (HOSPITAL_COMMUNITY): Payer: Self-pay | Admitting: Emergency Medicine

## 2021-07-03 ENCOUNTER — Emergency Department (HOSPITAL_COMMUNITY)
Admission: EM | Admit: 2021-07-03 | Discharge: 2021-07-04 | Disposition: A | Discharge status: Home / Self Care | Payer: Medicare Other | Attending: Emergency Medicine | Admitting: Emergency Medicine

## 2021-07-03 ENCOUNTER — Emergency Department (HOSPITAL_COMMUNITY): Payer: Medicare Other

## 2021-07-03 ENCOUNTER — Other Ambulatory Visit: Payer: Self-pay

## 2021-07-03 DIAGNOSIS — R1013 Epigastric pain: Secondary | ICD-10-CM | POA: Diagnosis present

## 2021-07-03 DIAGNOSIS — I10 Essential (primary) hypertension: Secondary | ICD-10-CM | POA: Insufficient documentation

## 2021-07-03 DIAGNOSIS — Z7982 Long term (current) use of aspirin: Secondary | ICD-10-CM | POA: Diagnosis not present

## 2021-07-03 DIAGNOSIS — E119 Type 2 diabetes mellitus without complications: Secondary | ICD-10-CM | POA: Insufficient documentation

## 2021-07-03 DIAGNOSIS — R112 Nausea with vomiting, unspecified: Secondary | ICD-10-CM | POA: Insufficient documentation

## 2021-07-03 DIAGNOSIS — Z79899 Other long term (current) drug therapy: Secondary | ICD-10-CM | POA: Diagnosis not present

## 2021-07-03 DIAGNOSIS — R000 Tachycardia, unspecified: Secondary | ICD-10-CM | POA: Insufficient documentation

## 2021-07-03 DIAGNOSIS — Z7984 Long term (current) use of oral hypoglycemic drugs: Secondary | ICD-10-CM | POA: Insufficient documentation

## 2021-07-03 LAB — CBC WITH DIFFERENTIAL/PLATELET
Abs Immature Granulocytes: 0.02 10*3/uL (ref 0.00–0.07)
Basophils Absolute: 0 10*3/uL (ref 0.0–0.1)
Basophils Relative: 0 %
Eosinophils Absolute: 0 10*3/uL (ref 0.0–0.5)
Eosinophils Relative: 0 %
HCT: 54 % — ABNORMAL HIGH (ref 39.0–52.0)
Hemoglobin: 16.9 g/dL (ref 13.0–17.0)
Immature Granulocytes: 0 %
Lymphocytes Relative: 14 %
Lymphs Abs: 1.2 10*3/uL (ref 0.7–4.0)
MCH: 26.4 pg (ref 26.0–34.0)
MCHC: 31.3 g/dL (ref 30.0–36.0)
MCV: 84.4 fL (ref 80.0–100.0)
Monocytes Absolute: 0.7 10*3/uL (ref 0.1–1.0)
Monocytes Relative: 9 %
Neutro Abs: 6.2 10*3/uL (ref 1.7–7.7)
Neutrophils Relative %: 77 %
Platelets: 244 10*3/uL (ref 150–400)
RBC: 6.4 MIL/uL — ABNORMAL HIGH (ref 4.22–5.81)
RDW: 15.3 % (ref 11.5–15.5)
WBC: 8.1 10*3/uL (ref 4.0–10.5)
nRBC: 0 % (ref 0.0–0.2)

## 2021-07-03 MED ORDER — ALUM & MAG HYDROXIDE-SIMETH 200-200-20 MG/5ML PO SUSP
30.0000 mL | Freq: Once | ORAL | Status: AC
  Administered 2021-07-03: 30 mL via ORAL
  Filled 2021-07-03: qty 30

## 2021-07-03 MED ORDER — MORPHINE SULFATE (PF) 4 MG/ML IV SOLN
6.0000 mg | Freq: Once | INTRAVENOUS | Status: AC
  Administered 2021-07-03: 6 mg via INTRAMUSCULAR
  Filled 2021-07-03: qty 2

## 2021-07-03 MED ORDER — LIDOCAINE VISCOUS HCL 2 % MT SOLN
15.0000 mL | Freq: Once | OROMUCOSAL | Status: AC
  Administered 2021-07-03: 15 mL via ORAL
  Filled 2021-07-03: qty 15

## 2021-07-03 MED ORDER — ONDANSETRON 4 MG PO TBDP
4.0000 mg | ORAL_TABLET | Freq: Once | ORAL | Status: AC
  Administered 2021-07-03: 4 mg via ORAL
  Filled 2021-07-03: qty 1

## 2021-07-03 NOTE — ED Triage Notes (Signed)
Pt reports upper abdominal pain since this morning.  Emesis as well.  OTC medication did not help relieve the pain.

## 2021-07-03 NOTE — ED Provider Notes (Signed)
MSE was initiated and I personally evaluated the patient and placed orders (if any) at  10:54 PM on July 03, 2021.  Patient to ED with sharp, progressive epigastric abdominal pain that started this morning. It does not radiate. No chest pain, SOB. It is associated with nausea, vomiting. History of HTN, CAD, T2DM, HLD. Tried TUMs and Pepto bismal without relief. No regular use of NSAIDs.  Today's Vitals   07/03/21 2239 07/03/21 2242  BP: (!) 157/80   Pulse: (!) 43   Resp: 18   Temp: 98.7 F (37.1 C)   TempSrc: Oral   SpO2: 98%   PainSc:  10-Worst pain ever   There is no height or weight on file to calculate BMI.  Appears significantly uncomfortable.  Epigastric tenderness.   The patient appears stable so that the remainder of the MSE may be completed by another provider.   Elpidio Anis, PA-C 07/03/21 2257    Milagros Loll, MD 07/04/21 [phone]

## 2021-07-04 ENCOUNTER — Emergency Department (HOSPITAL_COMMUNITY): Payer: Medicare Other

## 2021-07-04 LAB — COMPREHENSIVE METABOLIC PANEL
ALT: 22 U/L (ref 0–44)
AST: 21 U/L (ref 15–41)
Albumin: 3.9 g/dL (ref 3.5–5.0)
Alkaline Phosphatase: 38 U/L (ref 38–126)
Anion gap: 10 (ref 5–15)
BUN: 14 mg/dL (ref 8–23)
CO2: 28 mmol/L (ref 22–32)
Calcium: 9.6 mg/dL (ref 8.9–10.3)
Chloride: 98 mmol/L (ref 98–111)
Creatinine, Ser: 1.42 mg/dL — ABNORMAL HIGH (ref 0.61–1.24)
GFR, Estimated: 54 mL/min — ABNORMAL LOW (ref >60)
Glucose, Bld: 120 mg/dL — ABNORMAL HIGH (ref 70–99)
Potassium: 4 mmol/L (ref 3.5–5.1)
Sodium: 136 mmol/L (ref 135–145)
Total Bilirubin: 1.2 mg/dL (ref 0.3–1.2)
Total Protein: 7.4 g/dL (ref 6.5–8.1)

## 2021-07-04 LAB — LIPASE, BLOOD: Lipase: 26 U/L (ref 11–51)

## 2021-07-04 LAB — TROPONIN I (HIGH SENSITIVITY)
Troponin I (High Sensitivity): 22 ng/L — ABNORMAL HIGH (ref <18)
Troponin I (High Sensitivity): 24 ng/L — ABNORMAL HIGH (ref <18)

## 2021-07-04 MED ORDER — PANTOPRAZOLE SODIUM 40 MG IV SOLR
40.0000 mg | Freq: Once | INTRAVENOUS | Status: AC
  Administered 2021-07-04: 40 mg via INTRAVENOUS
  Filled 2021-07-04: qty 40

## 2021-07-04 MED ORDER — FENTANYL CITRATE PF 50 MCG/ML IJ SOSY
50.0000 ug | PREFILLED_SYRINGE | Freq: Once | INTRAMUSCULAR | Status: AC
  Administered 2021-07-04: 50 ug via INTRAVENOUS
  Filled 2021-07-04: qty 1

## 2021-07-04 MED ORDER — FENTANYL CITRATE PF 50 MCG/ML IJ SOSY
100.0000 ug | PREFILLED_SYRINGE | Freq: Once | INTRAMUSCULAR | Status: AC
  Administered 2021-07-04: 100 ug via INTRAVENOUS
  Filled 2021-07-04: qty 2

## 2021-07-04 MED ORDER — HYDROCODONE-ACETAMINOPHEN 5-325 MG PO TABS
1.0000 | ORAL_TABLET | Freq: Once | ORAL | Status: AC
  Administered 2021-07-04: 1 via ORAL
  Filled 2021-07-04: qty 1

## 2021-07-04 MED ORDER — ONDANSETRON HCL 4 MG/2ML IJ SOLN
4.0000 mg | Freq: Once | INTRAMUSCULAR | Status: AC
  Administered 2021-07-04: 4 mg via INTRAVENOUS
  Filled 2021-07-04: qty 2

## 2021-07-04 MED ORDER — IOHEXOL 350 MG/ML SOLN
100.0000 mL | Freq: Once | INTRAVENOUS | Status: AC | PRN
  Administered 2021-07-04: 100 mL via INTRAVENOUS

## 2021-07-04 MED ORDER — OMEPRAZOLE 20 MG PO CPDR: 20.0000 mg | DELAYED_RELEASE_CAPSULE | Freq: Every day | ORAL | 0 refills | Status: AC

## 2021-07-04 NOTE — Discharge Instructions (Addendum)

## 2021-07-04 NOTE — ED Notes (Signed)
Pt transported to [facility]

## 2021-07-04 NOTE — ED Provider Notes (Signed)
[facility] EMERGENCY DEPARTMENT Provider Note   CSN: [id number] Arrival date & time: 07/03/21  2232     History Chief Complaint  Patient presents with   Abdominal Pain    Troy Obrien is a 70 y.o. male.  The history is provided by the patient.  Abdominal Pain Pain location:  Epigastric Pain quality: aching   Pain radiates to:  Does not radiate Pain severity:  Moderate Onset quality:  Gradual Duration:  1 day Timing:  Constant Progression:  Worsening Chronicity:  New Relieved by:  Nothing Worsened by:  Movement and palpation Associated symptoms: nausea and vomiting   Associated symptoms: no chest pain, no constipation, no diarrhea and no fever   Patient presents with epigastric pain for the past day.  No chest pain or shortness of breath.  He does report vomiting.  Denies frequent use of NSAIDs.  Previous history of abdominal surgery for hernia    Past Medical History:  Diagnosis Date   Arthritis    Arthritis of hand 07/08/2012   Back pain 03/30/2012   BPH associated with nocturia 05/10/2014   Bradycardia 01/06/2019   Chest discomfort 05/25/2020   Chronic left-sided low back pain without sciatica 09/05/2017   Chronic pain syndrome 04/19/2017   Chronic right-sided thoracic back pain 03/26/2014   Combined forms of age-related cataract, bilateral 07/27/2017   Diabetes mellitus due to underlying condition with unspecified complications (HCC) 05/25/2020   Diabetes mellitus without complication (HCC)    Encounter for long-term current use of high risk medication 09/11/2018   Erectile dysfunction 04/13/2014   Essential hypertension 02/02/2012   Fatigue 07/08/2012   Glaucoma    Gout    Headache    Hernia 07/08/2012   Hyperesthesia 09/11/2018   Hyperlipidemia    Hypersomnia with sleep apnea 02/17/2016   Hypertension    Inguinal hernia    Inguinal hernia, right 03/30/2012   Lumbar spondylosis 07/02/2019   Morbid obesity (HCC) 05/25/2020   Morbid  obesity due to excess calories (HCC) 02/17/2016   Myalgia 09/11/2018   Nasal obstruction 02/17/2016   Nasal turbinate hypertrophy 09/05/2017   Nocturia more than twice per night 02/17/2016   Obesity (BMI 35.0-39.9 without comorbidity) 03/31/2021   Opioid type dependence, continuous (HCC) 04/19/2017   Other chronic sinusitis 02/04/2015   Perennial allergic rhinitis 02/04/2015   Polypharmacy 04/13/2014   Primary open angle glaucoma (POAG) of right eye, severe stage 05/29/2014   Formatting of this note might be different from the original. Added automatically from request for surgery [id number]   Raynaud phenomenon 04/13/2014   Rickets, fetal    Right groin pain 02/20/2013   Right upper quadrant pain 03/30/2012   Sleep apnea 07/08/2012   Sleep related headaches 02/17/2016   Snoring 02/17/2016   Spondylosis without myelopathy or radiculopathy, lumbar region 08/16/2019   Trigger thumb of left hand 12/25/2020   Vitamin D deficiency     Patient Active Problem List   Diagnosis Date Noted   Obesity (BMI 35.0-39.9 without comorbidity) 03/31/2021   Headache    Arthritis    Diabetes mellitus without complication (HCC)    Gout    Hypertension    Inguinal hernia    Rickets, fetal    Trigger thumb of left hand 12/25/2020   Diabetes mellitus due to underlying condition with unspecified complications (HCC) 05/25/2020   Morbid obesity (HCC) 05/25/2020   Chest discomfort 05/25/2020   Spondylosis without myelopathy or radiculopathy, lumbar region 08/16/2019   Lumbar spondylosis 07/02/2019  Bradycardia 01/06/2019   Encounter for long-term current use of high risk medication 09/11/2018   Myalgia 09/11/2018   Hyperesthesia 09/11/2018   Nasal turbinate hypertrophy 09/05/2017   Combined forms of age-related cataract, bilateral 07/27/2017   Chronic pain syndrome 04/19/2017   Opioid type dependence, continuous (HCC) 04/19/2017   Hyperlipidemia 05/02/2016   Hypersomnia with sleep apnea 02/17/2016    Snoring 02/17/2016   Nocturia more than twice per night 02/17/2016   Nasal obstruction 02/17/2016   Sleep related headaches 02/17/2016   Morbid obesity due to excess calories (HCC) 02/17/2016   Vitamin D deficiency 02/04/2015   Other chronic sinusitis 02/04/2015   Perennial allergic rhinitis 02/04/2015   Primary open angle glaucoma (POAG) of right eye, severe stage 05/29/2014   BPH associated with nocturia 05/10/2014   Polypharmacy 04/13/2014   Raynaud phenomenon 04/13/2014   Erectile dysfunction 04/13/2014   Chronic right-sided thoracic back pain 03/26/2014   Right groin pain 02/20/2013   Arthritis of hand 07/08/2012   Fatigue 07/08/2012   Sleep apnea 07/08/2012   Hernia 07/08/2012   Inguinal hernia, right 03/30/2012   Right upper quadrant pain 03/30/2012   Back pain 03/30/2012   Essential hypertension 02/02/2012   Glaucoma 02/02/2012    Past Surgical History:  Procedure Laterality Date   carpel tunnel     bilateral   CATARACT EXTRACTION W/ INTRAOCULAR LENS IMPLANT Bilateral    CIRCUMCISION  1972   INGUINAL HERNIA REPAIR  10/11/2012   Procedure: HERNIA REPAIR INGUINAL ADULT;  Surgeon: Robyne Askew, MD;  Location: [facility] OR;  Service: General;  Laterality: Right;   INSERTION OF MESH  10/11/2012   Procedure: INSERTION OF MESH;  Surgeon: Robyne Askew, MD;  Location: [facility] OR;  Service: General;  Laterality: Right;   NASAL TURBINATE REDUCTION  2015   TONSILLECTOMY  1963   TRABECULECTOMY Right 11/19/2019   Surgeon: Tilden Dome, MD, [facility]/ Opthalmology   TRABECULECTOMY Left 12/15/2020       Family History  Adopted: Yes    Social History   Tobacco Use   Smoking status: Never   Smokeless tobacco: Never  Vaping Use   Vaping Use: Never used  Substance Use Topics   Alcohol use: Yes    Alcohol/week: 0.0 standard drinks    Comment: rare   Drug use: No    Home Medications Prior to Admission medications   Medication Sig Start Date End Date Taking?  Authorizing Provider  aspirin 81 MG tablet Take 81 mg by mouth daily.   Yes [provider]  atorvastatin (LIPITOR) 40 MG tablet Take 40 mg by mouth daily. 01/27/21  Yes [provider]  brimonidine (ALPHAGAN) 0.2 % ophthalmic solution Place 1 drop into both eyes 2 (two) times daily. 11/18/17  Yes Bond, Doran Stabler, MD  HYDROcodone-acetaminophen [facility]) 10-325 MG tablet Take 1-2 tablets by mouth every 6 (six) hours as needed for moderate pain. 01/30/19  Yes Lezlie Lye, Irma M, MD  ipratropium (ATROVENT) 0.06 % nasal spray Place 2 sprays into both nostrils 4 (four) times daily as needed for rhinitis. 12/21/20  Yes [provider]  levocetirizine (XYZAL) 5 MG tablet Take 10 mg by mouth every morning. 12/21/20  Yes [provider]  metFORMIN (GLUCOPHAGE-XR) 500 MG 24 hr tablet Take 1,500 mg by mouth daily with breakfast.   Yes [provider]  nitroGLYCERIN (NITROSTAT) 0.4 MG SL tablet Place 0.4 mg under the tongue every 5 (five) minutes as needed for chest pain.  05/25/20  Yes [provider]  olmesartan-hydrochlorothiazide (BENICAR HCT) 40-25 MG tablet Take 1 tablet by mouth daily. 03/31/21  Yes Revankar, Aundra Dubin, MD  omeprazole (PRILOSEC) 20 MG capsule Take 1 capsule (20 mg total) by mouth daily. 07/04/21  Yes Zadie Rhine, MD  testosterone cypionate (DEPOTESTOSTERONE CYPIONATE) 200 MG/ML injection Inject 1 mL (200 mg total) into the muscle every 28 (twenty-eight) days. NEED OFFICE VISIT WITH LABS IN 6 WEEKS OR 10 WEEKS DUE TO DOSE CHANGE. Patient taking differently: Inject 200 mg into the muscle every 28 (twenty-eight) days. 12/11/18  Yes Sherren Mocha, MD    Allergies    Laxative [bisacodyl] and Cyclobenzaprine  Review of Systems   Review of Systems  Constitutional:  Negative for fever.  Cardiovascular:  Negative for chest pain.  Gastrointestinal:  Positive for abdominal pain, nausea and vomiting. Negative for anal bleeding, blood in stool,  constipation and diarrhea.  Neurological:  Negative for syncope and weakness.  All other systems reviewed and are negative.  Physical Exam Updated Vital Signs BP 118/75   Pulse (!) 44   Temp 98 F (36.7 C) (Oral)   Resp 17   SpO2 93%   Physical Exam CONSTITUTIONAL: Well developed/well nourished HEAD: Normocephalic/atraumatic EYES: EOMI/PERRL, no icterus NECK: supple no meningeal signs SPINE/BACK:entire spine nontender CV: S1/S2 noted, no murmurs/rubs/gallops noted LUNGS: Lungs are clear to auscultation bilaterally, no apparent distress ABDOMEN: soft, mild epigastric tenderness, no rebound or guarding, bowel sounds noted throughout abdomen GU:no cva tenderness NEURO: Pt is awake/alert/appropriate, moves all extremitiesx4.  No facial droop.   EXTREMITIES: pulses normal/equal, full ROM SKIN: warm, color normal PSYCH: no abnormalities of mood noted, alert and oriented to situation  ED Results / Procedures / Treatments   Labs (all labs ordered are listed, but only abnormal results are displayed) Labs Reviewed  CBC WITH DIFFERENTIAL/PLATELET - Abnormal; Notable for the following components:      Result Value   RBC 6.40 (*)    HCT 54.0 (*)    All other components within normal limits  COMPREHENSIVE METABOLIC PANEL - Abnormal; Notable for the following components:   Glucose, Bld 120 (*)    Creatinine, Ser 1.42 (*)    GFR, Estimated 54 (*)    All other components within normal limits  TROPONIN I (HIGH SENSITIVITY) - Abnormal; Notable for the following components:   Troponin I (High Sensitivity) 22 (*)    All other components within normal limits  TROPONIN I (HIGH SENSITIVITY) - Abnormal; Notable for the following components:   Troponin I (High Sensitivity) 24 (*)    All other components within normal limits  LIPASE, BLOOD    EKG EKG Interpretation  Date/Time:  Saturday July 03 2021 23:00:34 EDT Ventricular Rate:  42 PR Interval:  182 QRS Duration: 92 QT  Interval:  440 QTC Calculation: 367 R Axis:   -5 Text Interpretation: Marked sinus bradycardia Abnormal QRS-T angle, consider primary T wave abnormality Abnormal ECG Confirmed by Zadie Rhine ([id number]) on 07/04/2021 1:06:07 AM  Radiology DG Chest 2 View  Result Date: 07/03/2021 CLINICAL DATA:  Epigastric abdominal pain, nausea, vomiting EXAM: CHEST - 2 VIEW COMPARISON:  05/17/2020 FINDINGS: Lungs are well expanded, symmetric, and clear. No pneumothorax or pleural effusion. Cardiac size within normal limits. Pulmonary vascularity is normal. Osseous structures are age-appropriate. No acute bone abnormality. IMPRESSION: No active cardiopulmonary disease. Electronically Signed   By: Helyn Numbers M.D.   On: 07/03/2021 23:29   CT ABDOMEN PELVIS W CONTRAST  Result Date:  07/04/2021 CLINICAL DATA:  Abdominal pain, nonlocalized. EXAM: CT ABDOMEN AND PELVIS WITH CONTRAST TECHNIQUE: Multidetector CT imaging of the abdomen and pelvis was performed using the standard protocol following bolus administration of intravenous contrast. CONTRAST:  100mL OMNIPAQUE IOHEXOL 350 MG/ML SOLN COMPARISON:  None. FINDINGS: LOWER CHEST: Normal. HEPATOBILIARY: Normal hepatic contours. No intra- or extrahepatic biliary dilatation. The gallbladder is normal. PANCREAS: Normal pancreas. No ductal dilatation or peripancreatic fluid collection. SPLEEN: Normal. ADRENALS/URINARY TRACT: The adrenal glands are normal. There are multiple bilateral simple renal cysts. No hydronephrosis, nephroureterolithiasis or solid renal mass. The urinary bladder is normal for degree of distention STOMACH/BOWEL: There is no hiatal hernia. Normal duodenal course and caliber. No small bowel dilatation or inflammation. Rectosigmoid diverticulosis without acute inflammation. Normal appendix. VASCULAR/LYMPHATIC: There is calcific atherosclerosis of the abdominal aorta. No lymphadenopathy. REPRODUCTIVE: Enlarged prostate measures 5.1 cm in transverse dimension.  MUSCULOSKELETAL. No bony spinal canal stenosis or focal osseous abnormality. OTHER: None. IMPRESSION: 1. No acute abnormality of the abdomen or pelvis. 2. Rectosigmoid diverticulosis without acute inflammation. Aortic Atherosclerosis (ICD10-I70.0). Electronically Signed   By: Deatra RobinsonKevin  Herman M.D.   On: 07/04/2021 03:49   US Abdomen Limited RUQ (LIVER/GB)  Result Date: 07/04/2021 CLINICAL DATA:  Epigastric abdominal pain, nausea, vomiting EXAM: ULTRASOUND ABDOMEN LIMITED RIGHT UPPER QUADRANT COMPARISON:  None. FINDINGS: Gallbladder: No gallstones or wall thickening visualized. No sonographic Murphy sign noted by sonographer. Common bile duct: Diameter: 6 mm in proximal diameter Liver: No focal lesion identified. Within normal limits in parenchymal echogenicity. Portal vein is patent on color Doppler imaging with normal direction of blood flow towards the liver. Other: None. IMPRESSION: Normal right upper quadrant sonogram. Electronically Signed   By: Helyn NumbersAshesh  Parikh M.D.   On: 07/04/2021 02:05    Procedures Procedures   Medications Ordered in ED Medications  fentaNYL (SUBLIMAZE) injection 50 mcg (has no administration in time range)  ondansetron (ZOFRAN-ODT) disintegrating tablet 4 mg (4 mg Oral Given 07/03/21 2259)  alum & mag hydroxide-simeth (MAALOX/MYLANTA) 200-200-20 MG/5ML suspension 30 mL (30 mLs Oral Given 07/03/21 2301)    And  lidocaine (XYLOCAINE) 2 % viscous mouth solution 15 mL (15 mLs Oral Given 07/03/21 2301)  morphine 4 MG/ML injection 6 mg (6 mg Intramuscular Given 07/03/21 2326)  fentaNYL (SUBLIMAZE) injection 100 mcg (100 mcg Intravenous Given 07/04/21 0137)  ondansetron (ZOFRAN) injection 4 mg (4 mg Intravenous Given 07/04/21 0136)  fentaNYL (SUBLIMAZE) injection 100 mcg (100 mcg Intravenous Given 07/04/21 0244)  pantoprazole (PROTONIX) injection 40 mg (40 mg Intravenous Given 07/04/21 0259)  iohexol (OMNIPAQUE) 350 MG/ML injection 100 mL (100 mLs Intravenous Contrast Given 07/04/21 0324)   HYDROcodone-acetaminophen (NORCO/VICODIN) 5-325 MG per tablet 1 tablet (1 tablet Oral Given 07/04/21 0534)    ED Course  I have reviewed the triage vital signs and the nursing notes.  Pertinent labs & imaging results that were available during my care of the patient were reviewed by me and considered in my medical decision making (see chart for details).    MDM Rules/Calculators/A&P                           Patient presented with epigastric pain.  Ultrasound is negative, will proceed with CT scan 6:49 AM Patient monitored for several hours and is overall improved.  Extensive imaging has been unremarkable.   I had a long discussion with the patient.  He reports he has been having hiccups recently he was placed on medications that he  is unsure the name of. After starting medications he began having this upper abdominal pain.  The pain did not radiate into his back.  He flatly denies any chest pain or shortness of breath.  The patient had focal abdominal tenderness.  No acute ST changes on EKG.  Troponin is only minimally elevated but unlikely to be ACS at this time. Patient did have sinus bradycardia, but he denies any syncope or weakness of this appears to be a chronic issue.  He is not on beta-blocker Telemetry monitoring shows heart rate mostly in the 40s and no AV block  He denies any history of NSAID abuse.  No recent GI bleed symptoms.  Patient reports pain is still present but much improved.  He drank 2 cans of soda and overall appears improved. Patient prefers to be discharged home. Will start PPI. Refer to GI next week Final Clinical Impression(s) / ED Diagnoses Final diagnoses:  Epigastric pain  Nausea and vomiting, intractability of vomiting not specified, unspecified vomiting type    Rx / DC Orders ED Discharge Orders          Ordered    omeprazole (PRILOSEC) 20 MG capsule  Daily        07/04/21 0631    Ambulatory referral to Gastroenterology        07/04/21 2620              Zadie Rhine, MD 07/04/21 [id number]

## 2021-07-04 NOTE — ED Notes (Signed)
2nd request for phlebotomy assistance in obtaining troponin. RN unable to draw from IV and straight stick to rt hand unsuccessful.

## 2021-07-04 NOTE — ED Notes (Signed)
Pt asking for  Pain med

## 2021-08-28 DIAGNOSIS — H02814 Retained foreign body in left upper eyelid: Secondary | ICD-10-CM | POA: Insufficient documentation

## 2021-08-28 DIAGNOSIS — S0502XA Injury of conjunctiva and corneal abrasion without foreign body, left eye, initial encounter: Secondary | ICD-10-CM | POA: Insufficient documentation

## 2021-09-06 ENCOUNTER — Encounter: Payer: Self-pay | Admitting: Emergency Medicine

## 2021-09-08 ENCOUNTER — Ambulatory Visit: Payer: Medicare Other | Admitting: Internal Medicine

## 2022-04-01 ENCOUNTER — Other Ambulatory Visit: Payer: Self-pay | Admitting: Cardiology

## 2022-04-01 NOTE — Telephone Encounter (Signed)
Rx refill sent to pharmacy. 

## 2022-08-15 ENCOUNTER — Ambulatory Visit (HOSPITAL_COMMUNITY)
Admission: RE | Admit: 2022-08-15 | Discharge: 2022-08-15 | Disposition: A | Discharge status: Home / Self Care | Payer: Medicare Other | Attending: Psychiatry | Admitting: Psychiatry

## 2022-08-15 ENCOUNTER — Emergency Department (HOSPITAL_COMMUNITY)
Admission: EM | Admit: 2022-08-15 | Discharge: 2022-08-15 | Disposition: A | Discharge status: Home / Self Care | Payer: Medicare Other | Attending: Emergency Medicine | Admitting: Emergency Medicine

## 2022-08-15 ENCOUNTER — Other Ambulatory Visit: Payer: Self-pay

## 2022-08-15 ENCOUNTER — Encounter (HOSPITAL_COMMUNITY): Payer: Self-pay

## 2022-08-15 DIAGNOSIS — M549 Dorsalgia, unspecified: Secondary | ICD-10-CM | POA: Insufficient documentation

## 2022-08-15 DIAGNOSIS — Z79899 Other long term (current) drug therapy: Secondary | ICD-10-CM | POA: Insufficient documentation

## 2022-08-15 DIAGNOSIS — Z7984 Long term (current) use of oral hypoglycemic drugs: Secondary | ICD-10-CM | POA: Insufficient documentation

## 2022-08-15 DIAGNOSIS — Z7982 Long term (current) use of aspirin: Secondary | ICD-10-CM | POA: Diagnosis not present

## 2022-08-15 DIAGNOSIS — Z760 Encounter for issue of repeat prescription: Secondary | ICD-10-CM | POA: Insufficient documentation

## 2022-08-15 DIAGNOSIS — Z79891 Long term (current) use of opiate analgesic: Secondary | ICD-10-CM | POA: Insufficient documentation

## 2022-08-15 MED ORDER — ATORVASTATIN CALCIUM 40 MG PO TABS: 40.0000 mg | ORAL_TABLET | Freq: Every day | ORAL | 0 refills | Status: AC

## 2022-08-15 MED ORDER — HYDROCODONE-ACETAMINOPHEN 5-325 MG PO TABS
1.0000 | ORAL_TABLET | Freq: Once | ORAL | Status: AC
  Administered 2022-08-15: 1 via ORAL
  Filled 2022-08-15: qty 1

## 2022-08-15 MED ORDER — METFORMIN HCL ER 500 MG PO TB24: 1500.0000 mg | ORAL_TABLET | Freq: Every day | ORAL | 0 refills | Status: AC

## 2022-08-15 MED ORDER — OLMESARTAN MEDOXOMIL-HCTZ 40-25 MG PO TABS: 1.0000 | ORAL_TABLET | Freq: Every day | ORAL | 0 refills | Status: AC

## 2022-08-15 MED ORDER — HYDROCODONE-ACETAMINOPHEN 10-325 MG PO TABS: 1.0000 | ORAL_TABLET | Freq: Four times a day (QID) | ORAL | 0 refills | Status: DC | PRN

## 2022-08-15 NOTE — Discharge Instructions (Signed)
It is critical you work to find a new primary care provider to manage your health as well as your pain medications. Narcotic pain medications will not be refilled through the emergency room.

## 2022-08-15 NOTE — H&P (Cosign Needed Addendum)
Behavioral Health Medical Screening Exam  HPI: Troy Obrien is a 71 y.o. African-American male who presents voluntarily accompanied by his spouse as a walk-in to [place] for complaint of worsening back pain and requesting opioid medication prescription.  Patient has no significant psychiatric diagnoses, however has several medical comorbidities to include arthritis, arthritis of hand, back pain, BPH associated with nocturia, bradycardia, chest discomfort, chronic pain syndrome, chronic right-sided thoracic back pain, combined form of age-related cataract bilateral, diabetes mellitus due to underlying condition with unspecified complications, diabetes mellitus without complication, encounter with long-term current use of high-risk medication, erectile dysfunction, essential hypertension, fatigue, glaucoma, gout, headache, hernia, hyperesthesia, hyperlipidemia, hypersomnia with sleep apnea, hypertension, inguinal hernia, inguinal hernia right, lumbar spondylosis, morbid obesity, morbid obesity due to excess calories, myalgia, nasal obstruction, nasal turbinate hypertrophy, nocturia more than twice per night, obesity BMI 35-39.9 without comorbidity, opioid Dependence continuous, other chronic sinusitis, perineal allergic rhinitis, polypharmacy, primary open-angle glaucoma of right eye severe stage, retinoids phenomenon, rickets fetal, right groin pain, right upper quadrant pain, sleep apnea, sleep-related headaches, snoring, spondylosis without myelopathy or radiculopathy lumbar region, trigger thumb of left hand, and vitamin D deficiency.  Assessment: Patient was seen face-to-face and examined in the screen room.  Appeared calm however frowning because of pain.  Chart reviewed and findings shared with the treatment team and consult with Dr. Dwyane Dee.  Alert and oriented x4 to person, time, place, and situation.  Maintained good eye contact with this provider. Speech clear and fluent.   Present with anxious mood and congruent affect.  When asked what brought him to the hospital reports that he needs a prescription for opioids, that he ran out of his opioids, and his PCP has closed his clinic.  Made patient and spouse aware that we provide care for patients with mental illness and do not prescribe opioids for patient.  Patient expresses desperate need.  Thought process coherent with logical thought content.  Sensorium with memory, judgment, and insight good.  Patient denies SI, HI, paranoia, delusional, or AVH.  Denies suicidal attempt in the past, denies seeing a therapist or psychiatrist currently, denies family history of mental illness, denies taking any psychotropic medication, reports drinking beer occasionally, denies drug use, denies tobacco use, denies history of abuse, endorses presence of firearms in the house, however, is locked up.  Rates anxiety as 6/10 with 10 being the highest, due to pain.  Reports sleeping over 5 hours last night because of pain and reports support system to be the wife.  Disposition: Based on my evaluation, patient did not meet the criteria for inpatient psychiatric admission.  Outpatient therapy resources and counseling services provided.  Patient and spouse left [facility] without any incident.  Added prior to departure, I could not bear this pain and need to find a doctor that would prescription some opioids for me.  Total Time spent with patient: 45 minutes  Psychiatric Specialty Exam:  Presentation  General Appearance:  Appropriate for Environment; Casual; Fairly Groomed  Eye Contact: Good  Speech: Clear and Coherent; Normal Rate  Speech Volume: Normal  Handedness: Right  Mood and Affect  Mood: Anxious  Affect: Congruent  Thought Process  Thought Processes: Coherent; Goal Directed  Descriptions of Associations:Intact  Orientation:Full (Time, Place and Person)  Thought Content:Logical  History of Schizophrenia/Schizoaffective  disorder:No data recorded Duration of Psychotic Symptoms:No data recorded Hallucinations:Hallucinations: None  Ideas of Reference:None  Suicidal Thoughts:Suicidal Thoughts: No  Homicidal Thoughts:Homicidal Thoughts: No  Sensorium  Memory:  Immediate Good; Recent Good; Remote Good  Judgment: Good  Insight: Good  Executive Functions  Concentration: Good  Attention Span: Good  Recall: Good  Fund of Knowledge: Good  Language: Good  Psychomotor Activity  Psychomotor Activity: Psychomotor Activity: Normal  Assets  Assets: Communication Skills; Desire for Improvement; Social Support; Resilience; Financial Resources/Insurance; Physical Health; Transportation  Sleep  Sleep: Sleep: Good Number of Hours of Sleep: 5  Physical Exam: Physical Exam Vitals and nursing note reviewed.  Constitutional:      Appearance: Normal appearance.  HENT:     Head: Normocephalic.     Right Ear: External ear normal.     Left Ear: External ear normal.     Nose: Nose normal.     Mouth/Throat:     Mouth: Mucous membranes are moist.     Pharynx: Oropharynx is clear.  Eyes:     Conjunctiva/sclera: Conjunctivae normal.     Pupils: Pupils are equal, round, and reactive to light.  Cardiovascular:     Rate and Rhythm: Normal rate.  Pulmonary:     Effort: Pulmonary effort is normal.  Abdominal:     Palpations: Abdomen is soft.  Genitourinary:    Comments: Deferred Musculoskeletal:        General: Normal range of motion.     Cervical back: Normal range of motion.  Skin:    General: Skin is warm.  Neurological:     General: No focal deficit present.     Mental Status: He is alert and oriented to person, place, and time.  Psychiatric:        Mood and Affect: Mood normal.        Behavior: Behavior normal.    Review of Systems  Constitutional: Negative.  Negative for chills and fever.  HENT: Negative.  Negative for hearing loss and tinnitus.   Eyes: Negative.  Negative for  blurred vision and double vision.  Respiratory: Negative.  Negative for cough, sputum production, shortness of breath and wheezing.   Cardiovascular: Negative.  Negative for chest pain and palpitations.  Gastrointestinal: Negative.  Negative for abdominal pain, diarrhea, heartburn and vomiting.  Genitourinary: Negative.  Negative for dysuria, frequency and urgency.  Musculoskeletal:  Positive for back pain and joint pain. Negative for myalgias and neck pain.  Skin: Negative.  Negative for itching and rash.  Neurological:  Positive for headaches. Negative for dizziness.  Endo/Heme/Allergies: Negative.  Negative for environmental allergies and polydipsia. Does not bruise/bleed easily.       Laxative [Bisacodyl] Laxative [Bisacodyl]  Hives Not Specified Allergy 03/02/2012 Deletion Reason:  Adverse Reactions/Drug Intolerances   Cyclobenzaprine Cyclobenzaprine  Other (See Comments) Low Intolerance 10/28/2013 Hand twitching/spasms    Psychiatric/Behavioral:  The patient is nervous/anxious.    There were no vitals taken for this visit. There is no height or weight on file to calculate BMI.  Musculoskeletal: Strength & Muscle Tone: within normal limits Gait & Station: normal Patient leans: N/A  [place] Scale:  Flowsheet Row OP Visit from 08/15/2022 in [place] Most recent reading at 08/15/2022  6:50 PM ED from 08/15/2022 in [place] DEPT Most recent reading at 08/15/2022  6:23 PM ED from 07/03/2021 in [place] Most recent reading at 07/03/2021 11:07 PM  C-SSRS RISK CATEGORY No Risk No Risk No Risk       Recommendations:  Based on my evaluation the patient does not appear to have an emergency psychiatric/medical condition.  He did not  meet the criteria for psych inpatient admission and was psych cleared.  Laretta Bolster, FNP 08/15/2022, 6:51 PM

## 2022-08-15 NOTE — ED Provider Notes (Signed)
[facility]-EMERGENCY DEPT Provider Note   CSN: [id number] Arrival date & time: 08/15/22  1740     History  Chief Complaint  Patient presents with   Medication Refill   Back Pain    Troy Obrien is a 71 y.o. male.  71 year old male presents for refill of his medications including his hydrocodone.  Denies falls or injuries or changes to his chronic pain.  Patient states that he has been on hydrocodone for 10 to 12 years as prescribed by his PCP.  Patient received a letter about a month ago that his PCP office was closing, has not arranged for a new PCP.  Patient is now out of his hydrocodone x24 hours, starting to feel as if he is going into withdrawal.  Patient contacted his PCP office for refill however the office is now closed.       Home Medications Prior to Admission medications   Medication Sig Start Date End Date Taking? Authorizing Provider  HYDROcodone-acetaminophen (NORCO) 10-325 MG tablet Take 1 tablet by mouth every 6 (six) hours as needed. 08/15/22  Yes Jeannie Fend, PA-C  aspirin 81 MG tablet Take 81 mg by mouth daily.    [provider]  atorvastatin (LIPITOR) 40 MG tablet Take 1 tablet (40 mg total) by mouth daily. 08/15/22 09/14/22  Jeannie Fend, PA-C  brimonidine (ALPHAGAN) 0.2 % ophthalmic solution Place 1 drop into both eyes 2 (two) times daily. 11/18/17   Tilden Dome, MD  ipratropium (ATROVENT) 0.06 % nasal spray Place 2 sprays into both nostrils 4 (four) times daily as needed for rhinitis. 12/21/20   [provider]  levocetirizine (XYZAL) 5 MG tablet Take 10 mg by mouth every morning. 12/21/20   [provider]  metFORMIN (GLUCOPHAGE-XR) 500 MG 24 hr tablet Take 3 tablets (1,500 mg total) by mouth daily with breakfast. 08/15/22 09/14/22  Jeannie Fend, PA-C  nitroGLYCERIN (NITROSTAT) 0.4 MG SL tablet Place 0.4 mg under the tongue every 5 (five) minutes as needed for chest pain. 05/25/20   [provider]  olmesartan-hydrochlorothiazide (BENICAR HCT) 40-25 MG tablet Take 1 tablet by mouth daily. 08/15/22 09/14/22  Jeannie Fend, PA-C  omeprazole (PRILOSEC) 20 MG capsule Take 1 capsule (20 mg total) by mouth daily. 07/04/21   Zadie Rhine, MD  testosterone cypionate (DEPOTESTOSTERONE CYPIONATE) 200 MG/ML injection Inject 1 mL (200 mg total) into the muscle every 28 (twenty-eight) days. NEED OFFICE VISIT WITH LABS IN 6 WEEKS OR 10 WEEKS DUE TO DOSE CHANGE. Patient taking differently: Inject 200 mg into the muscle every 28 (twenty-eight) days. 12/11/18   Sherren Mocha, MD      Allergies    Laxative [bisacodyl] and Cyclobenzaprine    Review of Systems   Review of Systems Negative except as per HPI Physical Exam Updated Vital Signs BP (!) 139/93 (BP Location: Right Arm)   Pulse (!) 52   Temp 98 F (36.7 C) (Oral)   Resp 16   Ht 5' 7.5" (1.715 m)   Wt 104.3 kg   SpO2 99%   BMI 35.49 kg/m  Physical Exam Vitals and nursing note reviewed.  Constitutional:      General: He is not in acute distress.    Appearance: He is well-developed. He is not diaphoretic.  HENT:     Head: Normocephalic and atraumatic.  Pulmonary:     Effort: Pulmonary effort is normal.  Skin:    General: Skin is warm and  dry.     Findings: No erythema or rash.  Neurological:     Mental Status: He is alert and oriented to person, place, and time.     Gait: Gait normal.  Psychiatric:        Behavior: Behavior normal.     ED Results / Procedures / Treatments   Labs (all labs ordered are listed, but only abnormal results are displayed) Labs Reviewed - No data to display  EKG None  Radiology No results found.  Procedures Procedures    Medications Ordered in ED Medications  HYDROcodone-acetaminophen (NORCO/VICODIN) 5-325 MG per tablet 1 tablet (1 tablet Oral Given 08/15/22 1838)    ED Course/ Medical Decision Making/ A&P                           Medical Decision  Making Risk Prescription drug management.   71 year old male with request for refill of his medications including his hydrocodone.  Patient has been prescribed hydrocodone 08/02/2024's which he takes daily, has not had his medication for 24 hours.  Has been on this medication for 10 to 12 years.  Patient is aware he will need to establish care with a primary care provider and may need to see pain management in order to have this medication managed.  Discussed with Dr. Rogene Houston.  Informed patient that there are strict laws regarding prescribing narcotics from the emergency department, he is provided with 12 tablets of his hydrocodone and advised he will need to follow-up with primary care for further management, this medication cannot be refilled from the emergency room.  He is also provided with refills of his metformin, Lipitor, Benicar.        Final Clinical Impression(s) / ED Diagnoses Final diagnoses:  Medication refill    Rx / DC Orders ED Discharge Orders          Ordered    HYDROcodone-acetaminophen (NORCO) 10-325 MG tablet  Every 6 hours PRN        08/15/22 1824    metFORMIN (GLUCOPHAGE-XR) 500 MG 24 hr tablet  Daily with breakfast        08/15/22 1824    atorvastatin (LIPITOR) 40 MG tablet  Daily        08/15/22 1824    olmesartan-hydrochlorothiazide (BENICAR HCT) 40-25 MG tablet  Daily        08/15/22 1824              Tacy Learn, PA-C 08/15/22 2310    Fredia Sorrow, MD 08/15/22 2350

## 2022-08-15 NOTE — ED Triage Notes (Signed)
Patient c/o back pain and states he ran out of his pain medication yesterday. Patient states that he is out of Hydrocodone and states that he has taken 10-12 years.

## 2023-08-03 ENCOUNTER — Emergency Department (HOSPITAL_COMMUNITY): Payer: Medicare Other

## 2023-08-03 ENCOUNTER — Encounter (HOSPITAL_COMMUNITY): Payer: Self-pay

## 2023-08-03 ENCOUNTER — Other Ambulatory Visit: Payer: Self-pay

## 2023-08-03 ENCOUNTER — Emergency Department (HOSPITAL_COMMUNITY)
Admission: EM | Admit: 2023-08-03 | Discharge: 2023-08-03 | Disposition: A | Discharge status: Home / Self Care | Payer: Medicare Other | Attending: Emergency Medicine | Admitting: Emergency Medicine

## 2023-08-03 DIAGNOSIS — R001 Bradycardia, unspecified: Secondary | ICD-10-CM | POA: Insufficient documentation

## 2023-08-03 DIAGNOSIS — Z7982 Long term (current) use of aspirin: Secondary | ICD-10-CM | POA: Diagnosis not present

## 2023-08-03 DIAGNOSIS — Y92410 Unspecified street and highway as the place of occurrence of the external cause: Secondary | ICD-10-CM | POA: Insufficient documentation

## 2023-08-03 DIAGNOSIS — E119 Type 2 diabetes mellitus without complications: Secondary | ICD-10-CM | POA: Diagnosis not present

## 2023-08-03 DIAGNOSIS — M546 Pain in thoracic spine: Secondary | ICD-10-CM | POA: Diagnosis present

## 2023-08-03 DIAGNOSIS — I6782 Cerebral ischemia: Secondary | ICD-10-CM | POA: Insufficient documentation

## 2023-08-03 MED ORDER — ACETAMINOPHEN 325 MG PO TABS
650.0000 mg | ORAL_TABLET | ORAL | Status: AC
  Administered 2023-08-03: 650 mg via ORAL
  Filled 2023-08-03: qty 2

## 2023-08-03 MED ORDER — HYDROCODONE-ACETAMINOPHEN 5-325 MG PO TABS: 1.0000 | ORAL_TABLET | ORAL | 0 refills | Status: DC | PRN

## 2023-08-03 MED ORDER — LIDOCAINE 5 % EX PTCH
1.0000 | MEDICATED_PATCH | CUTANEOUS | Status: DC
  Administered 2023-08-03: 1 via TRANSDERMAL
  Filled 2023-08-03: qty 1

## 2023-08-03 MED ORDER — HYDROCODONE-ACETAMINOPHEN 5-325 MG PO TABS
1.0000 | ORAL_TABLET | Freq: Once | ORAL | Status: AC
  Administered 2023-08-03: 1 via ORAL
  Filled 2023-08-03: qty 1
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
## 2023-11-17 DIAGNOSIS — G250 Essential tremor: Secondary | ICD-10-CM | POA: Diagnosis not present

## 2023-11-17 DIAGNOSIS — E1169 Type 2 diabetes mellitus with other specified complication: Secondary | ICD-10-CM | POA: Diagnosis not present

## 2023-11-17 DIAGNOSIS — Z79899 Other long term (current) drug therapy: Secondary | ICD-10-CM | POA: Diagnosis not present

## 2023-11-17 DIAGNOSIS — I10 Essential (primary) hypertension: Secondary | ICD-10-CM | POA: Diagnosis not present

## 2023-11-17 DIAGNOSIS — Z0000 Encounter for general adult medical examination without abnormal findings: Secondary | ICD-10-CM | POA: Diagnosis not present

## 2023-11-17 DIAGNOSIS — Z139 Encounter for screening, unspecified: Secondary | ICD-10-CM | POA: Diagnosis not present

## 2023-11-17 DIAGNOSIS — Z0189 Encounter for other specified special examinations: Secondary | ICD-10-CM | POA: Diagnosis not present

## 2024-01-03 DIAGNOSIS — J3089 Other allergic rhinitis: Secondary | ICD-10-CM | POA: Diagnosis not present

## 2024-01-03 DIAGNOSIS — R499 Unspecified voice and resonance disorder: Secondary | ICD-10-CM | POA: Diagnosis not present

## 2024-01-03 DIAGNOSIS — R251 Tremor, unspecified: Secondary | ICD-10-CM | POA: Diagnosis not present

## 2024-01-13 DIAGNOSIS — H401113 Primary open-angle glaucoma, right eye, severe stage: Secondary | ICD-10-CM | POA: Diagnosis not present

## 2024-01-13 DIAGNOSIS — H401122 Primary open-angle glaucoma, left eye, moderate stage: Secondary | ICD-10-CM | POA: Diagnosis not present

## 2024-01-23 DIAGNOSIS — R490 Dysphonia: Secondary | ICD-10-CM | POA: Insufficient documentation

## 2024-01-23 DIAGNOSIS — K219 Gastro-esophageal reflux disease without esophagitis: Secondary | ICD-10-CM | POA: Insufficient documentation

## 2024-02-27 DIAGNOSIS — R251 Tremor, unspecified: Secondary | ICD-10-CM | POA: Diagnosis not present

## 2024-05-07 DIAGNOSIS — R253 Fasciculation: Secondary | ICD-10-CM | POA: Diagnosis not present

## 2024-05-13 DIAGNOSIS — H401113 Primary open-angle glaucoma, right eye, severe stage: Secondary | ICD-10-CM | POA: Diagnosis not present

## 2024-05-13 DIAGNOSIS — H401122 Primary open-angle glaucoma, left eye, moderate stage: Secondary | ICD-10-CM | POA: Diagnosis not present

## 2024-07-29 DIAGNOSIS — G4733 Obstructive sleep apnea (adult) (pediatric): Secondary | ICD-10-CM | POA: Diagnosis not present

## 2024-07-29 DIAGNOSIS — G9340 Encephalopathy, unspecified: Secondary | ICD-10-CM | POA: Diagnosis not present

## 2024-09-09 NOTE — Progress Notes (Signed)
 Troy Obrien                                          MRN: [id number]   09/09/2024   The [facility] Quality Team Specialist reviewed this patient medical record for the purposes of chart review for care gap closure. The following were reviewed: abstraction for care gap closure-glycemic status assessment.    [facility] Quality Team

## 2024-10-03 ENCOUNTER — Encounter: Payer: Self-pay | Admitting: Cardiology

## 2024-10-03 ENCOUNTER — Ambulatory Visit: Attending: Cardiology

## 2024-10-03 ENCOUNTER — Ambulatory Visit: Attending: Cardiology | Admitting: Cardiology

## 2024-10-03 VITALS — BP 120/64 | HR 54 | Resp 16 | Ht 67.0 in | Wt 243.0 lb

## 2024-10-03 DIAGNOSIS — R002 Palpitations: Secondary | ICD-10-CM

## 2024-10-03 DIAGNOSIS — G4733 Obstructive sleep apnea (adult) (pediatric): Secondary | ICD-10-CM

## 2024-10-03 DIAGNOSIS — R001 Bradycardia, unspecified: Secondary | ICD-10-CM | POA: Diagnosis not present

## 2024-10-03 DIAGNOSIS — I10 Essential (primary) hypertension: Secondary | ICD-10-CM

## 2024-10-03 DIAGNOSIS — E088 Diabetes mellitus due to underlying condition with unspecified complications: Secondary | ICD-10-CM

## 2024-10-03 DIAGNOSIS — R072 Precordial pain: Secondary | ICD-10-CM | POA: Diagnosis not present

## 2024-10-03 MED ORDER — ISOSORBIDE MONONITRATE ER 30 MG PO TB24: 30.0000 mg | ORAL_TABLET | Freq: Every day | ORAL | 3 refills | Status: AC

## 2024-10-03 NOTE — Progress Notes (Signed)
 Cardiology Consultation:    Date:  10/03/2024   ID:  Troy Obrien, DOB 1950-11-05, MRN [id number]  PCP:  Loreli Elyn SAILOR, MD  Cardiologist:  Lamar Fitch, MD   Referring MD: Loreli Elyn SAILOR, MD   No chief complaint on file.   History of Present Illness:    Troy Obrien is a 73 y.o. male who is being seen today for the evaluation of prolonged QT interval at the request of Loreli Elyn SAILOR, MD. past medical history significant for essential hypertension, dyslipidemia, diabetes mellitus, recently recognized severe sleep apnea, he was referred to us  with a diagnosis of prolonged QT interval*is quite interesting there is description of the EKG done on 24 October with description of prolonged QT interval but there is no value assigned to dated there is also no copy of this EKG I did review probably close to 20 EKG on this gentleman I did not see any evidence of prolongation of QT interval even more interesting on 4 November patient so cardiologist does for the same reason prolongation of QT interval, however that cardiologist also did not see this EKG.  During the conversation with him however I noticed that the main reason why he is here in my office is the fact that he got some chest pain.  He is in the room with his wife who is participating in decision making.  They have dogs that they walk and he said he cannot keep up with them because of shortness of breath sometimes chest pain.  Few years ago he was evaluated by in our practice by doing a stress test stress test was negative but he said this chest pain happened few months not worsening no resting chest pains.  He was recently diagnosed with severe sleep apnea in the matter-of-fact he is falling asleep talking to me.  Past Medical History:  Diagnosis Date   Arthritis    Arthritis of hand 07/08/2012   Back pain 03/30/2012   BPH associated with nocturia 05/10/2014   Bradycardia 01/06/2019   Chest discomfort 05/25/2020   Chronic left-sided low  back pain without sciatica 09/05/2017   Chronic pain syndrome 04/19/2017   Chronic right-sided thoracic back pain 03/26/2014   Combined forms of age-related cataract, bilateral 07/27/2017   Diabetes mellitus due to underlying condition with unspecified complications (HCC) 05/25/2020   Diabetes mellitus without complication (HCC)    Encounter for long-term current use of high risk medication 09/11/2018   Erectile dysfunction 04/13/2014   Essential hypertension 02/02/2012   Fatigue 07/08/2012   Glaucoma    Gout    Headache    Hernia 07/08/2012   Hyperesthesia 09/11/2018   Hyperlipidemia    Hypersomnia with sleep apnea 02/17/2016   Hypertension    Inguinal hernia    Inguinal hernia, right 03/30/2012   Laryngopharyngeal reflux (LPR) 01/23/2024   Lumbar spondylosis 07/02/2019   Morbid obesity (HCC) 05/25/2020   Morbid obesity due to excess calories (HCC) 02/17/2016   Myalgia 09/11/2018   Nasal obstruction 02/17/2016   Nasal turbinate hypertrophy 09/05/2017   Nocturia more than twice per night 02/17/2016   Obesity (BMI 35.0-39.9 without comorbidity) 03/31/2021   Opioid type dependence, continuous (HCC) 04/19/2017   Other chronic sinusitis 02/04/2015   Perennial allergic rhinitis 02/04/2015   Polypharmacy 04/13/2014   Primary open angle glaucoma (POAG) of right eye, severe stage 05/29/2014   Formatting of this note might be different from the original. Added automatically from request for surgery [id number]   Raynaud  phenomenon 04/13/2014   Rickets, fetal    Right groin pain 02/20/2013   Right upper quadrant pain 03/30/2012   Sleep apnea 07/08/2012   Sleep related headaches 02/17/2016   Snoring 02/17/2016   Spondylosis without myelopathy or radiculopathy, lumbar region 08/16/2019   Trigger thumb of left hand 12/25/2020   Vitamin D  deficiency     Past Surgical History:  Procedure Laterality Date   carpel tunnel     bilateral   CATARACT EXTRACTION W/ INTRAOCULAR LENS IMPLANT  Bilateral    CIRCUMCISION  1972   INGUINAL HERNIA REPAIR  10/11/2012   Procedure: HERNIA REPAIR INGUINAL ADULT;  Surgeon: Deward GORMAN Curvin DOUGLAS, MD;  Location[facility] OR;  Service: General;  Laterality: Right;   INSERTION OF MESH  10/11/2012   Procedure: INSERTION OF MESH;  Surgeon: Deward GORMAN Curvin DOUGLAS, MD;  Location: [facility] OR;  Service: General;  Laterality: Right;   NASAL TURBINATE REDUCTION  2015   TONSILLECTOMY  1963   TRABECULECTOMY Right 11/19/2019   Surgeon: Reyes Thresa Bracket, MD, [facility] OR/ Opthalmology   TRABECULECTOMY Left 12/15/2020    Current Medications: Current Meds  Medication Sig   aspirin 81 MG tablet Take 81 mg by mouth daily.   atorvastatin  (LIPITOR) 40 MG tablet Take 1 tablet (40 mg total) by mouth daily.   brimonidine (ALPHAGAN) 0.2 % ophthalmic solution Place 1 drop into both eyes 2 (two) times daily.   ipratropium (ATROVENT ) 0.06 % nasal spray Place 2 sprays into both nostrils 4 (four) times daily as needed for rhinitis.   levocetirizine (XYZAL ) 5 MG tablet Take 10 mg by mouth every morning.   metFORMIN  (GLUCOPHAGE -XR) 500 MG 24 hr tablet Take 3 tablets (1,500 mg total) by mouth daily with breakfast.   nitroGLYCERIN  (NITROSTAT ) 0.4 MG SL tablet Place 0.4 mg under the tongue every 5 (five) minutes as needed for chest pain.   olmesartan -hydrochlorothiazide (BENICAR  HCT) 40-25 MG tablet Take 1 tablet by mouth daily.   omeprazole  (PRILOSEC) 20 MG capsule Take 1 capsule (20 mg total) by mouth daily.   testosterone  cypionate (DEPOTESTOSTERONE CYPIONATE) 200 MG/ML injection Inject 1 mL (200 mg total) into the muscle every 28 (twenty-eight) days. NEED OFFICE VISIT WITH LABS IN 6 WEEKS OR 10 WEEKS DUE TO DOSE CHANGE. (Patient taking differently: Inject 200 mg into the muscle every 28 (twenty-eight) days.)     Allergies:   Laxative [bisacodyl] and Cyclobenzaprine    Social History   Socioeconomic History   Marital status: Married    Spouse name: Not on file   Number of children:  0   Years of education: Not on file   Highest education level: Not on file  Occupational History   Not on file  Tobacco Use   Smoking status: Never   Smokeless tobacco: Never  Vaping Use   Vaping status: Never Used  Substance and Sexual Activity   Alcohol use: Yes    Alcohol/week: 0.0 standard drinks of alcohol    Comment: rare   Drug use: No   Sexual activity: Yes    Birth control/protection: None  Other Topics Concern   Not on file  Social History Narrative   Not on file   Social Drivers of Health   Financial Resource Strain: Not on file  Food Insecurity: Low Risk  (09/12/2024)   Received from [facility]   Hunger Vital Sign    Within the past 12 months, you worried that your food would run out before you got money to buy  more: Never true    Within the past 12 months, the food you bought just didn't last and you didn't have money to get more. : Never true  Transportation Needs: No Transportation Needs (09/12/2024)   Received from [facility]    In the past 12 months, has lack of reliable transportation kept you from medical appointments, meetings, work or from getting things needed for daily living? : No  Physical Activity: Not on file  Stress: Not on file  Social Connections: Not on file     Family History: The patient's family history is not on file. He was adopted. ROS:   Please see the history of present illness.    All 14 point review of systems negative except as described per history of present illness.  EKGs/Labs/Other Studies Reviewed:    The following studies were reviewed today:   EKG:  EKG Interpretation Date/Time:  Thursday October 03 2024 14:22:58 EST Ventricular Rate:  54 PR Interval:  168 QRS Duration:  88 QT Interval:  464 QTC Calculation: 440 R Axis:   -16  Text Interpretation: Sinus bradycardia Minimal voltage criteria for LVH, may be normal variant ( R in aVL ) T wave abnormality, consider anterolateral ischemia  When compared with ECG of 03-Aug-2023 08:05, PREVIOUS ECG IS PRESENT Confirmed by Bernie Charleston [phone]) on 10/03/2024 2:26:40 PM    Recent Labs: No results found for requested labs within last 365 days.  Recent Lipid Panel    Component Value Date/Time   CHOL 160 09/06/2017 1739   TRIG 86 09/06/2017 1739   HDL 52 09/06/2017 1739   CHOLHDL 3.1 09/06/2017 1739   CHOLHDL 3.7 01/23/2016 0831   VLDL 25 01/23/2016 0831   LDLCALC 91 09/06/2017 1739   LDLDIRECT 114 (H) 09/11/2018 0917    Physical Exam:    VS:  BP 120/64 (BP Location: Right Arm, Patient Position: Sitting, Cuff Size: Large)   Pulse (!) 54   Resp 16   Ht 5' 7 (1.702 m)   Wt 243 lb (110.2 kg)   SpO2 94%   BMI 38.06 kg/m     Wt Readings from Last 3 Encounters:  10/03/24 243 lb (110.2 kg)  08/03/23 216 lb (98 kg)  08/15/22 230 lb (104.3 kg)     GEN:  Well nourished, well developed in no acute distress HEENT: Normal NECK: No JVD; No carotid bruits LYMPHATICS: No lymphadenopathy CARDIAC: RRR, no murmurs, no rubs, no gallops RESPIRATORY:  Clear to auscultation without rales, wheezing or rhonchi  ABDOMEN: Soft, non-tender, non-distended MUSCULOSKELETAL:  No edema; No deformity  SKIN: Warm and dry NEUROLOGIC:  Alert and oriented x 3 PSYCHIATRIC:  Normal affect   ASSESSMENT:    1. Essential hypertension   2. Precordial chest pain   3. Bradycardia   4. Obstructive sleep apnea syndrome   5. Diabetes mellitus due to underlying condition with unspecified complications (HCC)    PLAN:    In order of problems listed above:  Chest pain with some worrisome characteristic, he is on antiplatelet therapy already which I will continue, he is also on Lipitor which I will continue.  I will start Imdur 30 mg daily, I will schedule him to have a stress test to see if he had any inducible ischemia. Prolongation of QT no documentation for its only description but multiple EKGs reviewed no evidence of progression of QT interval  since he does have bradycardia I will ask him to have Zio patch to see if  he got any arrhythmia that could be related to prolongation of QT interval as well as degree of bradycardia. Obstructive sleep apnea which is new discovery apparently severe.  Waiting for CPAP equipment. Diabetes supposedly stable. Dyslipidemia again takes Lipitor 40 which I will continue for now   Medication Adjustments/Labs and Tests Ordered: Current medicines are reviewed at length with the patient today.  Concerns regarding medicines are outlined above.  Orders Placed This Encounter  Procedures   EKG 12-Lead   No orders of the defined types were placed in this encounter.   Signed, Lamar DOROTHA Fitch, MD, [facility]. 10/03/2024 2:52 PM    [facility]

## 2024-10-03 NOTE — Patient Instructions (Signed)
 Medication Instructions:   START: Imdur 30mg  1 tablet daily   Lab Work: None Ordered If you have labs (blood work) drawn today and your tests are completely normal, you will receive your results only by: MyChart Message (if you have MyChart) OR A paper copy in the mail If you have any lab test that is abnormal or we need to change your treatment, we will call you to review the results.   Testing/Procedures:  WHY IS MY DOCTOR PRESCRIBING ZIO? The Zio system is proven and trusted by physicians to detect and diagnose irregular heart rhythms -- and has been prescribed to hundreds of thousands of patients.  The FDA has cleared the Zio system to monitor for many different kinds of irregular heart rhythms. In a study, physicians were able to reach a diagnosis 90% of the time with the Zio system1.  You can wear the Zio monitor -- a small, discreet, comfortable patch -- during your normal day-to-day activity, including while you sleep, shower, and exercise, while it records every single heartbeat for analysis.  1Barrett, P., et al. Comparison of 24 Hour Holter Monitoring Versus 14 Day Novel Adhesive Patch Electrocardiographic Monitoring. American Journal of Medicine, 2014.  ZIO VS. HOLTER MONITORING The Zio monitor can be comfortably worn for up to 14 days. Holter monitors can be worn for 24 to 48 hours, limiting the time to record any irregular heart rhythms you may have. Zio is able to capture data for the 51% of patients who have their first symptom-triggered arrhythmia after 48 hours.1  LIVE WITHOUT RESTRICTIONS The Zio ambulatory cardiac monitor is a small, unobtrusive, and water-resistant patch--you might even forget you're wearing it. The Zio monitor records and stores every beat of your heart, whether you're sleeping, working out, or showering.    Your physician has requested that you have a lexiscan  myoview . For further information please visit [email or web address] Please follow  instruction sheet, as given.  The test will take approximately 3 to 4 hours to complete; you may bring reading material.  If someone comes with you to your appointment, they will need to remain in the main lobby due to limited space in the testing area. **If you are pregnant or breastfeeding, please notify the nuclear lab prior to your appointment**  How to prepare for your Myocardial Perfusion Test: Do not eat or drink 3 hours prior to your test, except you may have water. Do not consume products containing caffeine (regular or decaffeinated) 12 hours prior to your test. (ex: coffee, chocolate, sodas, tea). Do bring a list of your current medications with you.  If not listed below, you may take your medications as normal. Do wear comfortable clothes (no dresses or overalls) and walking shoes, tennis shoes preferred (No heels or open toe shoes are allowed). Do NOT wear cologne, perfume, aftershave, or lotions (deodorant is allowed). If these instructions are not followed, your test will have to be rescheduled.     Follow-Up: At [facility], you and your health needs are our priority.  As part of our continuing mission to provide you with exceptional heart care, we have created designated Provider Care Teams.  These Care Teams include your primary Cardiologist (physician) and Advanced Practice Providers (APPs -  Physician Assistants and Nurse Practitioners) who all work together to provide you with the care you need, when you need it.  We recommend signing up for the patient portal called MyChart.  Sign up information is provided on this After Visit Summary.  MyChart is  used to connect with patients for Virtual Visits (Telemedicine).  Patients are able to view lab/test results, encounter notes, upcoming appointments, etc.  Non-urgent messages can be sent to your provider as well.   To learn more about what you can do with MyChart, go to [email or web address].    Your next appointment:   2  month(s)  The format for your next appointment:   In Person  Provider:   Lamar Fitch, MD    Other Instructions NA

## 2024-10-04 DIAGNOSIS — N182 Chronic kidney disease, stage 2 (mild): Secondary | ICD-10-CM | POA: Insufficient documentation

## 2024-10-08 ENCOUNTER — Other Ambulatory Visit: Payer: Self-pay | Admitting: Cardiology

## 2024-10-08 DIAGNOSIS — R072 Precordial pain: Secondary | ICD-10-CM

## 2024-10-09 ENCOUNTER — Telehealth (HOSPITAL_COMMUNITY): Payer: Self-pay | Admitting: *Deleted

## 2024-10-09 NOTE — Telephone Encounter (Signed)
 Patient given detailed instructions per Myocardial Perfusion Study Information Sheet for the test on 10/14/24 Patient notified to arrive 15 minutes early and that it is imperative to arrive on time for appointment to keep from having the test rescheduled.  If you need to cancel or reschedule your appointment, please call the office within 24 hours of your appointment. . Patient verbalized understanding.Claudene Ronal Quale, RN

## 2024-10-11 ENCOUNTER — Encounter (HOSPITAL_COMMUNITY): Payer: Self-pay | Admitting: Cardiology

## 2024-10-14 ENCOUNTER — Inpatient Hospital Stay (HOSPITAL_COMMUNITY): Admission: RE | Admit: 2024-10-14 | Discharge: 2024-10-14 | Attending: Cardiology | Admitting: Cardiology

## 2024-10-14 DIAGNOSIS — R072 Precordial pain: Secondary | ICD-10-CM | POA: Diagnosis present

## 2024-10-14 LAB — MYOCARDIAL PERFUSION IMAGING
LV dias vol: 118 mL (ref 62–150)
LV sys vol: 42 mL (ref 4.2–5.8)
Nuc Stress EF: 64 %
Peak HR: 57 {beats}/min
Rest HR: 50 {beats}/min
Rest Nuclear Isotope Dose: 10.4 mCi
SDS: 1
SRS: 11
SSS: 7
ST Depression (mm): 0 mm
Stress Nuclear Isotope Dose: 32.5 mCi
TID: 1.17

## 2024-10-14 MED ORDER — REGADENOSON 0.4 MG/5ML IV SOLN
0.4000 mg | Freq: Once | INTRAVENOUS | Status: AC
  Administered 2024-10-14: 12:00:00 0.4 mg via INTRAVENOUS

## 2024-10-14 MED ORDER — TECHNETIUM TC 99M TETROFOSMIN IV KIT
32.5000 | PACK | Freq: Once | INTRAVENOUS | Status: AC | PRN
  Administered 2024-10-14: 12:00:00 32.5 via INTRAVENOUS

## 2024-10-14 MED ORDER — TECHNETIUM TC 99M TETROFOSMIN IV KIT
10.4000 | PACK | Freq: Once | INTRAVENOUS | Status: AC | PRN
  Administered 2024-10-14: 11:00:00 10.4 via INTRAVENOUS

## 2024-10-14 MED ORDER — REGADENOSON 0.4 MG/5ML IV SOLN
INTRAVENOUS | Status: AC
  Filled 2024-10-14: qty 5

## 2024-10-18 ENCOUNTER — Ambulatory Visit: Payer: Self-pay | Admitting: Cardiology

## 2024-10-18 ENCOUNTER — Telehealth: Payer: Self-pay

## 2024-10-18 DIAGNOSIS — R9431 Abnormal electrocardiogram [ECG] [EKG]: Secondary | ICD-10-CM

## 2024-10-18 NOTE — Telephone Encounter (Signed)
 Left message on My Chart with Stress test results per Dr. Karry note. Routed to PCP.

## 2024-11-20 DIAGNOSIS — R002 Palpitations: Secondary | ICD-10-CM | POA: Diagnosis not present

## 2024-11-22 ENCOUNTER — Telehealth: Payer: Self-pay

## 2024-11-22 NOTE — Telephone Encounter (Signed)
 Monitor Results reviewed with pt's spouse Heron as per Dr. Karry note.  Spouse verbalized understanding and had no additional questions. Routed to PCP

## 2024-11-28 ENCOUNTER — Encounter: Payer: Self-pay | Admitting: *Deleted

## 2024-12-03 ENCOUNTER — Ambulatory Visit: Admitting: Cardiology

## 2025-01-14 ENCOUNTER — Ambulatory Visit: Admitting: Cardiology
